# Patient Record
Sex: Female | Born: 1980
Health system: Southern US, Community
[De-identification: ages and names within clinical notes are randomized; demographics above are authoritative.]

## PROBLEM LIST (undated history)

## (undated) DIAGNOSIS — M1990 Unspecified osteoarthritis, unspecified site: Secondary | ICD-10-CM

## (undated) DIAGNOSIS — R51 Headache: Secondary | ICD-10-CM

## (undated) DIAGNOSIS — R112 Nausea with vomiting, unspecified: Secondary | ICD-10-CM

## (undated) DIAGNOSIS — F32A Depression, unspecified: Secondary | ICD-10-CM

## (undated) DIAGNOSIS — M349 Systemic sclerosis, unspecified: Secondary | ICD-10-CM

## (undated) DIAGNOSIS — F419 Anxiety disorder, unspecified: Secondary | ICD-10-CM

## (undated) DIAGNOSIS — Z1509 Genetic susceptibility to other malignant neoplasm: Secondary | ICD-10-CM

## (undated) DIAGNOSIS — Z9889 Other specified postprocedural states: Secondary | ICD-10-CM

## (undated) DIAGNOSIS — C50919 Malignant neoplasm of unspecified site of unspecified female breast: Secondary | ICD-10-CM

## (undated) DIAGNOSIS — K219 Gastro-esophageal reflux disease without esophagitis: Secondary | ICD-10-CM

## (undated) DIAGNOSIS — Z803 Family history of malignant neoplasm of breast: Secondary | ICD-10-CM

## (undated) DIAGNOSIS — T8859XA Other complications of anesthesia, initial encounter: Secondary | ICD-10-CM

## (undated) HISTORY — DX: Depression, unspecified: F32.A

## (undated) HISTORY — DX: Family history of malignant neoplasm of breast: Z80.3

## (undated) HISTORY — PX: UPPER GASTROINTESTINAL ENDOSCOPY: SHX188

## (undated) HISTORY — DX: Systemic sclerosis, unspecified: M34.9

## (undated) HISTORY — DX: Genetic susceptibility to other malignant neoplasm: Z15.09

## (undated) HISTORY — DX: Malignant neoplasm of unspecified site of unspecified female breast: C50.919

## (undated) HISTORY — PX: COLONOSCOPY: SHX174

---
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
## 1999-09-26 ENCOUNTER — Other Ambulatory Visit: Admission: RE | Admit: 1999-09-26 | Discharge: 1999-09-26 | Payer: Self-pay | Admitting: Family Medicine

## 2000-09-08 ENCOUNTER — Other Ambulatory Visit: Admission: RE | Admit: 2000-09-08 | Discharge: 2000-09-08 | Payer: Self-pay | Admitting: Family Medicine

## 2001-10-06 ENCOUNTER — Encounter: Payer: Self-pay | Admitting: Family Medicine

## 2001-10-06 ENCOUNTER — Encounter: Admission: RE | Admit: 2001-10-06 | Discharge: 2001-10-06 | Payer: Self-pay | Admitting: Family Medicine

## 2003-04-18 ENCOUNTER — Other Ambulatory Visit: Admission: RE | Admit: 2003-04-18 | Discharge: 2003-04-18 | Payer: Self-pay | Admitting: *Deleted

## 2003-11-06 ENCOUNTER — Inpatient Hospital Stay (HOSPITAL_COMMUNITY): Admission: AD | Admit: 2003-11-06 | Discharge: 2003-11-10 | Payer: Self-pay | Admitting: *Deleted

## 2003-12-07 ENCOUNTER — Encounter: Admission: RE | Admit: 2003-12-07 | Discharge: 2003-12-07 | Payer: Self-pay | Admitting: *Deleted

## 2004-05-21 ENCOUNTER — Encounter: Admission: RE | Admit: 2004-05-21 | Discharge: 2004-05-21 | Payer: Self-pay | Admitting: Family Medicine

## 2007-08-10 ENCOUNTER — Other Ambulatory Visit: Admission: RE | Admit: 2007-08-10 | Discharge: 2007-08-10 | Payer: Self-pay | Admitting: Family Medicine

## 2008-10-05 ENCOUNTER — Other Ambulatory Visit: Admission: RE | Admit: 2008-10-05 | Discharge: 2008-10-05 | Payer: Self-pay | Admitting: Family Medicine

## 2009-10-05 ENCOUNTER — Other Ambulatory Visit: Admission: RE | Admit: 2009-10-05 | Discharge: 2009-10-05 | Payer: Self-pay | Admitting: Family Medicine

## 2010-07-29 LAB — ABO/RH: RH Type: POSITIVE

## 2010-07-29 LAB — ANTIBODY SCREEN: Antibody Screen: NEGATIVE

## 2010-07-29 LAB — HEPATITIS B SURFACE ANTIGEN: Hepatitis B Surface Ag: NEGATIVE

## 2010-07-29 LAB — RUBELLA ANTIBODY, IGM: Rubella: IMMUNE

## 2010-07-29 LAB — RPR: RPR: NONREACTIVE

## 2010-07-29 LAB — HIV ANTIBODY (ROUTINE TESTING W REFLEX): HIV: NONREACTIVE

## 2010-09-20 NOTE — Discharge Summary (Signed)
NAME:  [name], [name]                           ACCOUNT NO.:  [id number]   MEDICAL RECORD NO.:  [id number]                   PATIENT TYPE:  INP   LOCATION:  9137                                 FACILITY:  [facility]   PHYSICIAN:  Genia Del, M.D.             DATE OF BIRTH:  09-17-1980   DATE OF ADMISSION:  11/06/2003  DATE OF DISCHARGE:  11/10/2003                                 DISCHARGE SUMMARY   ADMISSION DIAGNOSES:  39 plus weeks with spontaneous rupture of membranes  and mild pregnancy induced hypertension.   DISCHARGE DIAGNOSES:  39 plus weeks with spontaneous rupture of membranes  and mild pregnancy induced hypertension.  Arrest of progression and birth of  a baby boy by cesarean section.   PROCEDURE PERFORMED:  Primary urgent low transverse cesarean section.   HOSPITAL COURSE:  The patient was stimulated the Pitocin. She progressed up  to 4 cm dilatation and arrested at that dilatation.  A cesarean section was  done under spinal anesthesia.  It was a primary urgent low transverse  cesarean section. She had a baby boy at 31, Apgar's were 8 & 8. The  hysterotomy was closed in two planes, estimated blood loss 800 mL.  No  complications occurred at the time of surgery. The postop evaluation was  normal.  She remained afebrile and hemodynamically stable. Her postoperative  hemoglobin was 9.3. She was discharged home on postoperative day #3 in  stable status. Postop information and advice were given. The patient will  have Motrin over the counter p.r.n. and Tylox was prescribed p.r.n.  She  will followup at [facility] in 4-6 weeks.                                               Genia Del, M.D.    ML/MEDQ  D:  12/12/2003  T:  12/13/2003  Job:  [id number]

## 2010-09-20 NOTE — Op Note (Signed)
NAME:  [name], [name]                           ACCOUNT NO.:  [id number]   MEDICAL RECORD NO.:  [id number]                   PATIENT TYPE:  INP   LOCATION:  9137                                 FACILITY:  [facility]   PHYSICIAN:  Genia Del, M.D.             DATE OF BIRTH:  11-28-1980   DATE OF PROCEDURE:  11/07/2003  DATE OF DISCHARGE:                                 OPERATIVE REPORT   PREOPERATIVE DIAGNOSES:  39 plus weeks gestation, arrest of progression of  dilatation.   POSTOPERATIVE DIAGNOSES:  39 plus weeks gestation, arrest of progression of  dilatation.   INTERVENTION:  Primary low transverse urgent cesarean section.   SURGEON:  Genia Del, M.D.   ASSISTANT:  Gerri Spore B. Earlene Plater, M.D.   ANESTHESIOLOGIST:  Belva Agee, M.D.   DESCRIPTION OF PROCEDURE:  Under spinal anesthesia, the patient is in 15  degree left decubitus position, she is prepped with Hibiclens in the  abdominal, suprapubic and vulvar areas.  The bladder catheter is already in  place and the patient is draped as usual. After verifying anesthesia, a  Pfannenstiel incision is made with a scalpel, the aponeurosis is opened  transversely with Mayo scissors. The recti muscles are separated from the  aponeurosis on the midline. We then opened the parietoperitoneum  longitudinally. We opened the visceral peritoneum over the lower uterine  segment transversely with Metzenbaum scissors. The bladder is reclined  downward. We opened a hysterotomy transversely on the lower uterine segment  with a scalpel and __________ on each side with the scissors. The amniotic  fluid is clear. The fetus is in cephalic presentation. Birth of a baby boy  at 67, the cord is clamped and cut, the baby is given to the neonatal  team.  Apgar are 8 & 8, weight is 7 and 10 pounds. The placenta is  evacuated. It is complete with three vessels. Cord blood was taken before  doing so. We then do a manual revision of the uterine  cavity. The uterus  contracts well with Pitocin IV. A dose of IV antibiotic is given. We closed  the hysterotomy with a locked running suture of #0 chromic. We make a second  plain in a mattress fashion with #0 chromic. Hemostasis is completed with an  X stitch at the left angle. Both ovaries and both tubes are normal in size  and appearance. We then irrigate and suction the abdominopelvic cavity. We  complete hemostasis on the aponeurosis and recti muscles and bladder flap  with the electrocautery where necessary. We then close the aponeurosis in  two half running sutures of #0 Vicryl. The count of gauzes and instruments  is complete x2. The hemostasis is completed at the adipose tissue with the  electrocautery and the skin is reapproximated with staples. A dry dressing  is applied. The estimated blood loss was 800 mL, no complications occurred  and the patient was  brought to the recovery room in good status.                                               Genia Del, M.D.    ML/MEDQ  D:  11/07/2003  T:  11/07/2003  Job:  [id number]

## 2010-10-17 ENCOUNTER — Inpatient Hospital Stay (HOSPITAL_COMMUNITY)
Admission: AD | Admit: 2010-10-17 | Discharge: 2010-10-17 | Disposition: A | Discharge status: Home / Self Care | Payer: BC Managed Care – PPO | Source: Ambulatory Visit | Attending: Obstetrics and Gynecology | Admitting: Obstetrics and Gynecology

## 2010-10-17 DIAGNOSIS — O99891 Other specified diseases and conditions complicating pregnancy: Secondary | ICD-10-CM | POA: Insufficient documentation

## 2010-10-17 DIAGNOSIS — K5289 Other specified noninfective gastroenteritis and colitis: Secondary | ICD-10-CM | POA: Insufficient documentation

## 2010-10-17 DIAGNOSIS — R109 Unspecified abdominal pain: Secondary | ICD-10-CM | POA: Insufficient documentation

## 2010-10-17 LAB — COMPREHENSIVE METABOLIC PANEL
ALT: 12 U/L (ref 0–35)
AST: 15 U/L (ref 0–37)
Albumin: 3 g/dL — ABNORMAL LOW (ref 3.5–5.2)
Alkaline Phosphatase: 51 U/L (ref 39–117)
BUN: 5 mg/dL — ABNORMAL LOW (ref 6–23)
CO2: 23 mEq/L (ref 19–32)
Calcium: 9.1 mg/dL (ref 8.4–10.5)
Chloride: 100 mEq/L (ref 96–112)
Creatinine, Ser: <0.47 mg/dL (ref 0.4–1.2)
Glucose, Bld: 95 mg/dL (ref 70–99)
Potassium: 4.1 mEq/L (ref 3.5–5.1)
Sodium: 133 mEq/L — ABNORMAL LOW (ref 135–145)
Total Bilirubin: 0.1 mg/dL — ABNORMAL LOW (ref 0.3–1.2)
Total Protein: 6.4 g/dL (ref 6.0–8.3)

## 2010-10-17 LAB — DIFFERENTIAL
Basophils Absolute: 0 10*3/uL (ref 0.0–0.1)
Basophils Relative: 0 % (ref 0–1)
Eosinophils Absolute: 0 10*3/uL (ref 0.0–0.7)
Eosinophils Relative: 0 % (ref 0–5)
Lymphocytes Relative: 12 % (ref 12–46)
Lymphs Abs: 1.1 10*3/uL (ref 0.7–4.0)
Monocytes Absolute: 0.5 10*3/uL (ref 0.1–1.0)
Monocytes Relative: 6 % (ref 3–12)
Neutro Abs: 7.7 10*3/uL (ref 1.7–7.7)
Neutrophils Relative %: 83 % — ABNORMAL HIGH (ref 43–77)

## 2010-10-17 LAB — CBC
HCT: 31.9 % — ABNORMAL LOW (ref 36.0–46.0)
Hemoglobin: 11.1 g/dL — ABNORMAL LOW (ref 12.0–15.0)
MCH: 31.7 pg (ref 26.0–34.0)
MCHC: 34.8 g/dL (ref 30.0–36.0)
MCV: 91.1 fL (ref 78.0–100.0)
Platelets: 193 10*3/uL (ref 150–400)
RBC: 3.5 MIL/uL — ABNORMAL LOW (ref 3.87–5.11)
RDW: 13.3 % (ref 11.5–15.5)
WBC: 9.3 10*3/uL (ref 4.0–10.5)

## 2010-10-17 LAB — URINALYSIS, ROUTINE W REFLEX MICROSCOPIC
Bilirubin Urine: NEGATIVE
Glucose, UA: NEGATIVE mg/dL
Ketones, ur: NEGATIVE mg/dL
Leukocytes, UA: NEGATIVE
Nitrite: NEGATIVE
Protein, ur: NEGATIVE mg/dL
Specific Gravity, Urine: 1.025 (ref 1.005–1.030)
Urobilinogen, UA: 0.2 mg/dL (ref 0.0–1.0)
pH: 5.5 (ref 5.0–8.0)

## 2010-10-17 LAB — URINE MICROSCOPIC-ADD ON

## 2011-02-18 NOTE — Patient Instructions (Addendum)
   Your procedure is scheduled ZO:[id number] October 25th  Enter through the Main Entrance of [facility] at: 11:30 am Pick up the phone at the desk and dial [phone] and inform us of your arrival.  Please call this number if you have any problems the morning of surgery: [phone]  Remember: Do not eat food after midnight: Wednesday Do not drink clear liquids after:9am Thursday Take these medicines the morning of surgery with a SIP OF WATER:none  Do not wear jewelry, make-up, or FINGER nail polish Do not wear lotions, powders, deodorants or perfumes. Do not shave 48 hours prior to surgery. Do not bring valuables to the hospital.  Leave suitcase in the car. After Surgery it may be brought to your room. For patients being admitted to the hospital, checkout time is 11:00am the day of discharge.   Remember to use your hibiclens as instructed.Please shower with 1/2 bottle the evening before your surgery and the other 1/2 bottle the morning of surgery.

## 2011-02-19 ENCOUNTER — Encounter (HOSPITAL_COMMUNITY): Payer: Self-pay

## 2011-02-24 ENCOUNTER — Encounter (HOSPITAL_COMMUNITY): Payer: Self-pay

## 2011-02-24 ENCOUNTER — Encounter (HOSPITAL_COMMUNITY)
Admission: RE | Admit: 2011-02-24 | Discharge: 2011-02-24 | Disposition: A | Discharge status: Home / Self Care | Payer: BC Managed Care – PPO | Source: Ambulatory Visit | Attending: Obstetrics and Gynecology | Admitting: Obstetrics and Gynecology

## 2011-02-24 HISTORY — DX: Gastro-esophageal reflux disease without esophagitis: K21.9

## 2011-02-24 HISTORY — DX: Headache: R51

## 2011-02-24 LAB — CBC
HCT: 33.4 % — ABNORMAL LOW (ref 36.0–46.0)
Hemoglobin: 11.2 g/dL — ABNORMAL LOW (ref 12.0–15.0)
MCH: 30.6 pg (ref 26.0–34.0)
MCHC: 33.5 g/dL (ref 30.0–36.0)
MCV: 91.3 fL (ref 78.0–100.0)
Platelets: 222 10*3/uL (ref 150–400)
RBC: 3.66 MIL/uL — ABNORMAL LOW (ref 3.87–5.11)
RDW: 14 % (ref 11.5–15.5)
WBC: 7.2 10*3/uL (ref 4.0–10.5)

## 2011-02-24 LAB — SURGICAL PCR SCREEN
MRSA, PCR: NEGATIVE
Staphylococcus aureus: POSITIVE — AB

## 2011-02-24 LAB — RPR: RPR Ser Ql: NONREACTIVE

## 2011-02-26 NOTE — H&P (Signed)
  H&P  30 yo G2P1 , [facility] 03/06/11 confirmed by early U/S @ 39 weeks with pregnancy complicated by history of migraine has previous C/S admitted for repeat. No ROM, no CNS change.  PMH, PSH, FHx, OBHx, per prenatal H&P. Meds PNV NKDA  PE Lungs CTA Cor RRR Cx deferred  A: 39 weeks with previous C/S, desires repeat P: repeat C/S, D/W patient risks/benefits.

## 2011-02-27 ENCOUNTER — Encounter (HOSPITAL_COMMUNITY): Payer: Self-pay | Admitting: *Deleted

## 2011-02-27 ENCOUNTER — Encounter (HOSPITAL_COMMUNITY)
Admission: RE | Disposition: A | Discharge status: Home / Self Care | Payer: Self-pay | Source: Ambulatory Visit | Attending: Obstetrics and Gynecology

## 2011-02-27 ENCOUNTER — Inpatient Hospital Stay (HOSPITAL_COMMUNITY)
Admission: RE | Admit: 2011-02-27 | Discharge: 2011-03-01 | DRG: 371 | Disposition: A | Discharge status: Home / Self Care | Payer: BC Managed Care – PPO | Source: Ambulatory Visit | Attending: Obstetrics and Gynecology | Admitting: Obstetrics and Gynecology

## 2011-02-27 ENCOUNTER — Inpatient Hospital Stay (HOSPITAL_COMMUNITY): Payer: BC Managed Care – PPO | Admitting: Anesthesiology

## 2011-02-27 ENCOUNTER — Encounter (HOSPITAL_COMMUNITY): Payer: Self-pay | Admitting: Anesthesiology

## 2011-02-27 DIAGNOSIS — Z01818 Encounter for other preprocedural examination: Secondary | ICD-10-CM

## 2011-02-27 DIAGNOSIS — O34219 Maternal care for unspecified type scar from previous cesarean delivery: Principal | ICD-10-CM | POA: Diagnosis present

## 2011-02-27 DIAGNOSIS — Z01812 Encounter for preprocedural laboratory examination: Secondary | ICD-10-CM

## 2011-02-27 SURGERY — Surgical Case
Anesthesia: Spinal | Site: Abdomen | Wound class: Clean Contaminated

## 2011-02-27 MED ORDER — ATROPINE SULFATE 0.4 MG/ML IJ SOLN
INTRAMUSCULAR | Status: DC | PRN
  Administered 2011-02-27: 0.4 mg via INTRAVENOUS

## 2011-02-27 MED ORDER — DIPHENHYDRAMINE HCL 25 MG PO CAPS: 25.0000 mg | ORAL_CAPSULE | Freq: Four times a day (QID) | ORAL | Status: DC | PRN

## 2011-02-27 MED ORDER — ONDANSETRON HCL 4 MG/2ML IJ SOLN
INTRAMUSCULAR | Status: DC | PRN
  Administered 2011-02-27: 4 mg via INTRAVENOUS

## 2011-02-27 MED ORDER — BUPIVACAINE IN DEXTROSE 0.75-8.25 % IT SOLN
INTRATHECAL | Status: DC | PRN
  Administered 2011-02-27: 1.5 mL via INTRATHECAL

## 2011-02-27 MED ORDER — ATROPINE SULFATE 0.4 MG/ML IJ SOLN
INTRAMUSCULAR | Status: AC
  Filled 2011-02-27: qty 1

## 2011-02-27 MED ORDER — LANOLIN HYDROUS EX OINT: 1.0000 "application " | TOPICAL_OINTMENT | CUTANEOUS | Status: DC | PRN

## 2011-02-27 MED ORDER — SODIUM CHLORIDE 0.9 % IV SOLN: 1.0000 ug/kg/h | INTRAVENOUS | Status: DC | PRN

## 2011-02-27 MED ORDER — OXYCODONE-ACETAMINOPHEN 5-325 MG PO TABS
1.0000 | ORAL_TABLET | ORAL | Status: DC | PRN
  Administered 2011-02-28 – 2011-03-01 (×7): 1 via ORAL
  Filled 2011-02-27 (×6): qty 1
  Filled 2011-02-27: qty 2

## 2011-02-27 MED ORDER — MEPERIDINE HCL 25 MG/ML IJ SOLN: 6.2500 mg | INTRAMUSCULAR | Status: DC | PRN

## 2011-02-27 MED ORDER — MORPHINE SULFATE 0.5 MG/ML IJ SOLN
INTRAMUSCULAR | Status: AC
  Filled 2011-02-27: qty 10

## 2011-02-27 MED ORDER — ONDANSETRON HCL 4 MG/2ML IJ SOLN: 4.0000 mg | INTRAMUSCULAR | Status: DC | PRN

## 2011-02-27 MED ORDER — NALOXONE HCL 0.4 MG/ML IJ SOLN: 0.4000 mg | INTRAMUSCULAR | Status: DC | PRN

## 2011-02-27 MED ORDER — DIPHENHYDRAMINE HCL 50 MG/ML IJ SOLN: 25.0000 mg | INTRAMUSCULAR | Status: DC | PRN

## 2011-02-27 MED ORDER — SIMETHICONE 80 MG PO CHEW
80.0000 mg | CHEWABLE_TABLET | Freq: Three times a day (TID) | ORAL | Status: DC
  Administered 2011-02-27 – 2011-02-28 (×5): 80 mg via ORAL

## 2011-02-27 MED ORDER — IBUPROFEN 600 MG PO TABS
600.0000 mg | ORAL_TABLET | Freq: Four times a day (QID) | ORAL | Status: DC | PRN
  Filled 2011-02-27 (×3): qty 1

## 2011-02-27 MED ORDER — LACTATED RINGERS IV SOLN
INTRAVENOUS | Status: DC
  Administered 2011-02-27 (×4): via INTRAVENOUS

## 2011-02-27 MED ORDER — MORPHINE SULFATE (PF) 0.5 MG/ML IJ SOLN
INTRAMUSCULAR | Status: DC | PRN
  Administered 2011-02-27: .1 mg via INTRATHECAL

## 2011-02-27 MED ORDER — FENTANYL CITRATE 0.05 MG/ML IJ SOLN
INTRAMUSCULAR | Status: DC | PRN
  Administered 2011-02-27: 25 ug via INTRATHECAL

## 2011-02-27 MED ORDER — PANTOPRAZOLE SODIUM 40 MG PO TBEC
40.0000 mg | DELAYED_RELEASE_TABLET | Freq: Once | ORAL | Status: AC
  Administered 2011-02-27: 40 mg via ORAL

## 2011-02-27 MED ORDER — SCOPOLAMINE 1 MG/3DAYS TD PT72: 1.0000 | MEDICATED_PATCH | Freq: Once | TRANSDERMAL | Status: DC

## 2011-02-27 MED ORDER — LACTATED RINGERS IV SOLN: INTRAVENOUS | Status: DC

## 2011-02-27 MED ORDER — SODIUM CHLORIDE 0.9 % IR SOLN
Status: DC | PRN
  Administered 2011-02-27: 1

## 2011-02-27 MED ORDER — ZOLPIDEM TARTRATE 5 MG PO TABS: 5.0000 mg | ORAL_TABLET | Freq: Every evening | ORAL | Status: DC | PRN

## 2011-02-27 MED ORDER — ONDANSETRON HCL 4 MG/2ML IJ SOLN: 4.0000 mg | Freq: Three times a day (TID) | INTRAMUSCULAR | Status: DC | PRN

## 2011-02-27 MED ORDER — PANTOPRAZOLE SODIUM 40 MG PO TBEC
DELAYED_RELEASE_TABLET | ORAL | Status: AC
  Administered 2011-02-27: 40 mg via ORAL
  Filled 2011-02-27: qty 1

## 2011-02-27 MED ORDER — PHENYLEPHRINE HCL 10 MG/ML IJ SOLN
INTRAMUSCULAR | Status: DC | PRN
  Administered 2011-02-27: 80 ug via INTRAVENOUS
  Administered 2011-02-27: 40 ug via INTRAVENOUS
  Administered 2011-02-27: 80 ug via INTRAVENOUS
  Administered 2011-02-27 (×2): 40 ug via INTRAVENOUS
  Administered 2011-02-27: 80 ug via INTRAVENOUS
  Administered 2011-02-27: 40 ug via INTRAVENOUS

## 2011-02-27 MED ORDER — OXYTOCIN 20 UNITS IN LACTATED RINGERS INFUSION - SIMPLE: 125.0000 mL/h | INTRAVENOUS | Status: AC

## 2011-02-27 MED ORDER — SIMETHICONE 80 MG PO CHEW: 80.0000 mg | CHEWABLE_TABLET | ORAL | Status: DC | PRN

## 2011-02-27 MED ORDER — ONDANSETRON HCL 4 MG PO TABS: 4.0000 mg | ORAL_TABLET | ORAL | Status: DC | PRN

## 2011-02-27 MED ORDER — KETOROLAC TROMETHAMINE 30 MG/ML IJ SOLN
30.0000 mg | Freq: Four times a day (QID) | INTRAMUSCULAR | Status: AC | PRN
  Filled 2011-02-27: qty 1

## 2011-02-27 MED ORDER — NALBUPHINE HCL 10 MG/ML IJ SOLN: 5.0000 mg | INTRAMUSCULAR | Status: DC | PRN

## 2011-02-27 MED ORDER — FENTANYL CITRATE 0.05 MG/ML IJ SOLN
INTRAMUSCULAR | Status: AC
  Filled 2011-02-27: qty 2

## 2011-02-27 MED ORDER — KETOROLAC TROMETHAMINE 30 MG/ML IJ SOLN
30.0000 mg | Freq: Once | INTRAMUSCULAR | Status: AC
  Administered 2011-02-27: 30 mg via INTRAVENOUS

## 2011-02-27 MED ORDER — CEFAZOLIN SODIUM 1-5 GM-% IV SOLN: 1.0000 g | INTRAVENOUS | Status: DC

## 2011-02-27 MED ORDER — DIPHENHYDRAMINE HCL 50 MG/ML IJ SOLN: 12.5000 mg | INTRAMUSCULAR | Status: DC | PRN

## 2011-02-27 MED ORDER — DIPHENHYDRAMINE HCL 25 MG PO CAPS: 25.0000 mg | ORAL_CAPSULE | ORAL | Status: DC | PRN

## 2011-02-27 MED ORDER — PHENYLEPHRINE 40 MCG/ML (10ML) SYRINGE FOR IV PUSH (FOR BLOOD PRESSURE SUPPORT)
PREFILLED_SYRINGE | INTRAVENOUS | Status: AC
  Filled 2011-02-27: qty 10

## 2011-02-27 MED ORDER — OXYTOCIN 10 UNIT/ML IJ SOLN
INTRAMUSCULAR | Status: AC
  Filled 2011-02-27: qty 2

## 2011-02-27 MED ORDER — SCOPOLAMINE 1 MG/3DAYS TD PT72
1.0000 | MEDICATED_PATCH | Freq: Once | TRANSDERMAL | Status: DC
  Administered 2011-02-27: 1.5 mg via TRANSDERMAL

## 2011-02-27 MED ORDER — SODIUM CHLORIDE 0.9 % IJ SOLN: 3.0000 mL | INTRAMUSCULAR | Status: DC | PRN

## 2011-02-27 MED ORDER — CEFAZOLIN SODIUM 1-5 GM-% IV SOLN
INTRAVENOUS | Status: DC | PRN
  Administered 2011-02-27: 1 g via INTRAVENOUS

## 2011-02-27 MED ORDER — ONDANSETRON HCL 4 MG/2ML IJ SOLN
INTRAMUSCULAR | Status: AC
  Filled 2011-02-27: qty 2

## 2011-02-27 MED ORDER — WITCH HAZEL-GLYCERIN EX PADS: 1.0000 "application " | MEDICATED_PAD | CUTANEOUS | Status: DC | PRN

## 2011-02-27 MED ORDER — IBUPROFEN 600 MG PO TABS
600.0000 mg | ORAL_TABLET | Freq: Four times a day (QID) | ORAL | Status: DC
  Administered 2011-02-28 – 2011-03-01 (×7): 600 mg via ORAL
  Filled 2011-02-27 (×4): qty 1

## 2011-02-27 MED ORDER — PRENATAL PLUS 27-1 MG PO TABS
1.0000 | ORAL_TABLET | Freq: Every day | ORAL | Status: DC
  Administered 2011-02-28: 1 via ORAL
  Filled 2011-02-27: qty 1

## 2011-02-27 MED ORDER — TETANUS-DIPHTH-ACELL PERTUSSIS 5-2.5-18.5 LF-MCG/0.5 IM SUSP: 0.5000 mL | Freq: Once | INTRAMUSCULAR | Status: DC

## 2011-02-27 MED ORDER — SENNOSIDES-DOCUSATE SODIUM 8.6-50 MG PO TABS
2.0000 | ORAL_TABLET | Freq: Every day | ORAL | Status: DC
  Administered 2011-02-27 – 2011-02-28 (×2): 2 via ORAL

## 2011-02-27 MED ORDER — OXYTOCIN 10 UNIT/ML IJ SOLN
INTRAMUSCULAR | Status: DC | PRN
  Administered 2011-02-27 (×2): 20 [IU] via INTRAMUSCULAR

## 2011-02-27 MED ORDER — MENTHOL 3 MG MT LOZG: 1.0000 | LOZENGE | OROMUCOSAL | Status: DC | PRN

## 2011-02-27 MED ORDER — ACETAMINOPHEN 10 MG/ML IV SOLN: 1000.0000 mg | Freq: Four times a day (QID) | INTRAVENOUS | Status: AC | PRN

## 2011-02-27 MED ORDER — METOCLOPRAMIDE HCL 5 MG/ML IJ SOLN: 10.0000 mg | Freq: Three times a day (TID) | INTRAMUSCULAR | Status: DC | PRN

## 2011-02-27 MED ORDER — DIBUCAINE 1 % RE OINT: 1.0000 "application " | TOPICAL_OINTMENT | RECTAL | Status: DC | PRN

## 2011-02-27 SURGICAL SUPPLY — 31 items
APL SKNCLS STERI-STRIP NONHPOA (GAUZE/BANDAGES/DRESSINGS) ×1
BENZOIN TINCTURE PRP APPL 2/3 (GAUZE/BANDAGES/DRESSINGS) ×1 IMPLANT
CLOTH BEACON ORANGE TIMEOUT ST (SAFETY) ×2 IMPLANT
CONTAINER PREFILL 10% NBF 15ML (MISCELLANEOUS) IMPLANT
DRESSING TELFA 8X3 (GAUZE/BANDAGES/DRESSINGS) IMPLANT
DRSG COVADERM 4X14 (GAUZE/BANDAGES/DRESSINGS) ×1 IMPLANT
ELECT REM PT RETURN 9FT ADLT (ELECTROSURGICAL) ×2
ELECTRODE REM PT RTRN 9FT ADLT (ELECTROSURGICAL) ×1 IMPLANT
EXTRACTOR VACUUM M CUP 4 TUBE (SUCTIONS) IMPLANT
GAUZE SPONGE 4X4 12PLY STRL LF (GAUZE/BANDAGES/DRESSINGS) ×4 IMPLANT
GLOVE BIO SURGEON STRL SZ8 (GLOVE) ×4 IMPLANT
GOWN PREVENTION PLUS LG XLONG (DISPOSABLE) ×4 IMPLANT
GOWN STRL REIN XL XLG (GOWN DISPOSABLE) ×2 IMPLANT
KIT ABG SYR 3ML LUER SLIP (SYRINGE) ×2 IMPLANT
NDL HYPO 25X5/8 SAFETYGLIDE (NEEDLE) ×1 IMPLANT
NEEDLE HYPO 25X5/8 SAFETYGLIDE (NEEDLE) ×2 IMPLANT
NS IRRIG 1000ML POUR BTL (IV SOLUTION) ×2 IMPLANT
PACK C SECTION WH (CUSTOM PROCEDURE TRAY) ×2 IMPLANT
PAD ABD 7.5X8 STRL (GAUZE/BANDAGES/DRESSINGS) IMPLANT
SLEEVE SCD COMPRESS KNEE MED (MISCELLANEOUS) IMPLANT
STAPLER VISISTAT 35W (STAPLE) IMPLANT
STRIP CLOSURE SKIN 1/4X4 (GAUZE/BANDAGES/DRESSINGS) ×2 IMPLANT
SUT MNCRL 0 VIOLET CTX 36 (SUTURE) ×4 IMPLANT
SUT MONOCRYL 0 CTX 36 (SUTURE) ×4
SUT PDS AB 0 CTX 60 (SUTURE) ×2 IMPLANT
SUT PLAIN 0 NONE (SUTURE) IMPLANT
SUT PLAIN 2 0 XLH (SUTURE) ×1 IMPLANT
SUT VIC AB 4-0 KS 27 (SUTURE) ×1 IMPLANT
TOWEL OR 17X24 6PK STRL BLUE (TOWEL DISPOSABLE) ×4 IMPLANT
TRAY FOLEY CATH 14FR (SET/KITS/TRAYS/PACK) ×2 IMPLANT
WATER STERILE IRR 1000ML POUR (IV SOLUTION) ×2 IMPLANT

## 2011-02-27 NOTE — Transfer of Care (Signed)
Immediate Anesthesia Transfer of Care Note  Patient: [name]  Procedure(s) Performed:  CESAREAN SECTION - repeat  Patient Location: PACU  Anesthesia Type: Spinal  Level of Consciousness: awake  Airway & Oxygen Therapy: Patient Spontanous Breathing  Post-op Assessment: Report given to PACU RN  Post vital signs: Reviewed and stable  Complications: No apparent anesthesia complications

## 2011-02-27 NOTE — Brief Op Note (Signed)
02/27/2011  1:58 PM  PATIENT:  [name]  30 y.o. female G62P0 at39 weeks with previous c/s for repeat.  PRE-OPERATIVE DIAGNOSIS:  Previous cesarean section, desires repeat POST-OPERATIVE DIAGNOSIS:  Previous Cesarean Section, desires repeat  PROCEDURE:  Procedure(s): Repeat low tranverse CESAREAN SECTION  SURGEON:  Surgeon(s): Roselle Locus II  PHYSICIAN ASSISTANT:   ASSISTANTS: none   ANESTHESIA:   spinal  EBL:  Total I/O In: 3000 [I.V.:3000] Out: 700 [Urine:200; Blood:500]  BLOOD ADMINISTERED:none  DRAINS: Urinary Catheter (Foley)   LOCAL MEDICATIONS USED:  NONE  SPECIMEN:  No Specimen  DISPOSITION OF SPECIMEN:  N/A  COUNTS:  YES  TOURNIQUET:  * No tourniquets in log *  DICTATION: .Other Dictation: Dictation Number [id number]  PLAN OF CARE: Admit to inpatient   PATIENT DISPOSITION:  PACU - hemodynamically stable.   Delay start of Pharmacological VTE agent (>24hrs) due to surgical blood loss or risk of bleeding:  not applicable

## 2011-02-27 NOTE — Anesthesia Preprocedure Evaluation (Addendum)
Anesthesia Evaluation  Patient identified by MRN, date of birth, ID band Patient awake  General Assessment Comment  Reviewed: Allergy & Precautions, H&P , Patient's Chart, lab work & pertinent test results  Airway Mallampati: II TM Distance: >3 FB Neck ROM: full    Dental No notable dental hx.    Pulmonary  clear to auscultation  Pulmonary exam normal       Cardiovascular regular Normal    Neuro/Psych  Headaches, Negative Neurological ROS  Negative Psych ROS   GI/Hepatic negative GI ROS Neg liver ROS  GERD   Endo/Other  Negative Endocrine ROS  Renal/GU negative Renal ROS     Musculoskeletal   Abdominal   Peds  Hematology negative hematology ROS (+)   Anesthesia Other Findings   Reproductive/Obstetrics (+) Pregnancy                           Anesthesia Physical Anesthesia Plan  ASA: II  Anesthesia Plan: Spinal   Post-op Pain Management:    Induction:   Airway Management Planned:   Additional Equipment:   Intra-op Plan:   Post-operative Plan:   Informed Consent: I have reviewed the patients History and Physical, chart, labs and discussed the procedure including the risks, benefits and alternatives for the proposed anesthesia with the patient or authorized representative who has indicated his/her understanding and acceptance.     Plan Discussed with:   Anesthesia Plan Comments:        Anesthesia Quick Evaluation

## 2011-02-27 NOTE — Anesthesia Procedure Notes (Signed)
Spinal Block  Patient location during procedure: OR Start time: 02/27/2011 1:11 PM Staffing Performed by: anesthesiologist  Preanesthetic Checklist Completed: patient identified, site marked, surgical consent, pre-op evaluation, timeout performed, IV checked, risks and benefits discussed and monitors and equipment checked Spinal Block Patient position: sitting Prep: DuraPrep Patient monitoring: heart rate, cardiac monitor, continuous pulse ox and blood pressure Approach: midline Location: L3-4 Injection technique: single-shot Needle Needle type: Sprotte  Needle gauge: 24 G Needle length: 9 cm Assessment Sensory level: T4 Additional Notes Patient identified.  Risk benefits discussed including failed block, incomplete pain control, headache, nerve damage, paralysis, blood pressure changes, nausea, vomiting, reactions to medication both toxic or allergic, and postpartum back pain.  Patient expressed understanding and wished to proceed.  All questions were answered.  Sterile technique used throughout procedure.  CSF was clear.  No parasthesia or other complications.  Please see nursing notes for vital signs.

## 2011-02-27 NOTE — Op Note (Signed)
[name], [name]                 ACCOUNT NO.:  [id number]  MEDICAL RECORD NO.:  [id number]  LOCATION:  9102                          FACILITY:  [facility]  PHYSICIAN:  Guy Sandifer. Henderson Cloud, M.D. DATE OF BIRTH:  07-28-1980  DATE OF PROCEDURE:  02/27/2011 DATE OF DISCHARGE:                              OPERATIVE REPORT   PREOPERATIVE DIAGNOSES: 1. Intrauterine pregnancy at 60 weeks estimated gestational age. 2. Previous cesarean section, desires repeat.  POSTOPERATIVE DIAGNOSES: 1. Intrauterine pregnancy at 80 weeks estimated gestational age. 2. Previous cesarean section, desires repeat.  PROCEDURE:  Repeat low transverse cesarean section.  SURGEON:  Guy Sandifer. Henderson Cloud, MD.  ANESTHESIA:  Spinal, Brayton Caves, MD.  ESTIMATED BLOOD LOSS:  750 mL.  FINDINGS:  Viable female infant, Apgars of 9 and 9 at 1 and 5 minutes respectively.  Birth weight 9 pounds 1.2 ounces.  Arterial cord pH pending.  INDICATIONS AND CONSENT:  This patient is a 30 year old married white female, G2, P1, at 39 weeks with previous cesarean section.  After discussion of options, she is admitted for repeat.  Potential risks and complications have been discussed preoperatively including, but not limited to infection, organ damage, bleeding requiring transfusion of blood products with HIV and hepatitis acquisition, DVT, PE, pneumonia. All questions have been answered and consent is signed on the chart.  PROCEDURE IN DETAIL:  The patient was taken to the operating room.  She was identified, spinal anesthetic was placed, and she was placed in dorsal supine position with a 15-degree left lateral wedge.  She was prepped abdominally.  Foley catheter was placed in the bladder, and she was draped in sterile fashion.  After testing for adequate spinal anesthesia, skin was entered through the previous Pfannenstiel scar. Dissection was carried out in layers.  The omentum is widely adherent to the lower anterior abdominal wall.   Under good visualization, this was taken down well clear of any bowel.  The omentum was packed away.  The lower uterine segment was exposed.  The vesicouterine peritoneum was taken down cephalad laterally.  Bladder flap was developed.  Bladder blade was placed.  Uterus was incised in a low transverse manner and the uterine cavity was entered bluntly with a hemostat.  Uterine incision was extended cephalad laterally with fingers.  Because of large fetal head as well as the relatively narrow incision secondary to the above scarring, vacuum extractor was used to elevate the vertex through the incision without difficulty.  Cord around the shoulder was reduced.  The baby was then delivered without difficulty.  Good cry and tone was noted.  The cord was clamped and cut.  The baby was handed to awaiting Pediatrics Team.  Placenta was manually delivered.  Uterine cavity was clean.  Uterus was closed in 2 running locking imbricating layers of 0 Monocryl suture, which achieved good hemostasis.  Tubes and ovaries palpated normally.  Anterior peritoneum was closed in running fashion with 0 Monocryl suture, which was also used to reapproximate the pyramidalis muscle in midline.  Anterior rectus fascia was closed in a running fashion with a looped 0 PDS suture.  Subcuticular, subcutaneous layers were closed with interrupted plain suture and the  subcuticular layer was closed with a running subcuticular stitch of 3-0 Vicryl. Dressings were applied.  All counts correct.  The patient was taken to recovery room in stable condition.     Guy Sandifer Henderson Cloud, M.D.     JET/MEDQ  D:  02/27/2011  T:  02/27/2011  Job:  [id number]

## 2011-02-27 NOTE — Progress Notes (Signed)
Reviewed procedure with patient. No changes in history or exam. All questions answered. Repeat cesarean section.

## 2011-02-27 NOTE — Anesthesia Postprocedure Evaluation (Signed)
Anesthesia Post Note  Patient: [name]  Procedure(s) Performed:  CESAREAN SECTION - repeat  Anesthesia type: Spinal  Patient location: PACU  Post pain: Pain level controlled  Post assessment: Post-op Vital signs reviewed  Last Vitals:  Filed Vitals:   02/27/11 1515  BP: 93/64  Pulse: 70  Temp:   Resp:     Post vital signs: Reviewed  Level of consciousness: awake  Complications: No apparent anesthesia complications

## 2011-02-28 LAB — CBC
HCT: 29.6 % — ABNORMAL LOW (ref 36.0–46.0)
Hemoglobin: 10 g/dL — ABNORMAL LOW (ref 12.0–15.0)
MCH: 30.6 pg (ref 26.0–34.0)
MCHC: 33.8 g/dL (ref 30.0–36.0)
MCV: 90.5 fL (ref 78.0–100.0)
Platelets: 177 10*3/uL (ref 150–400)
RBC: 3.27 MIL/uL — ABNORMAL LOW (ref 3.87–5.11)
RDW: 13.8 % (ref 11.5–15.5)
WBC: 9.9 10*3/uL (ref 4.0–10.5)

## 2011-02-28 NOTE — Anesthesia Postprocedure Evaluation (Signed)
  Anesthesia Post-op Note  Patient: [name]  Procedure(s) Performed:  CESAREAN SECTION - repeat  Patient Location: PACU and Mother/Baby  Anesthesia Type: Spinal  Level of Consciousness: awake and alert   Airway and Oxygen Therapy: Patient Spontanous Breathing  Post-op Assessment: Patient's Cardiovascular Status Stable and Respiratory Function Stable  Post-op Vital Signs: stable  Complications: No apparent anesthesia complications

## 2011-02-28 NOTE — Addendum Note (Signed)
Addendum  created 02/28/11 4782 by Edison Pace, CRNA   Modules edited:Notes Section

## 2011-02-28 NOTE — Progress Notes (Signed)
Subjective: Postpartum Day 1: Cesarean Delivery Patient reports tolerating PO and no problems voiding.    Objective: Vital signs in last 24 hours: Temp:  [97.3 F (36.3 C)-98.6 F (37 C)] 98.5 F (36.9 C) (10/26 0553) Pulse Rate:  [65-99] 76  (10/26 0553) Resp:  [18-20] 20  (10/26 0553) BP: (93-129)/(43-85) 104/57 mmHg (10/26 0553) SpO2:  [95 %-100 %] 96 % (10/26 0553) Weight:  [90.719 kg (200 lb)] 200 lb (90.719 kg) (10/25 1700)  Physical Exam:  General: alert and cooperative Lochia: appropriate Uterine Fundus: firm Abdominal dressing noted with drainage.Dressing was partially removed without evidence of current bleeding DVT Evaluation: No evidence of DVT seen on physical exam.   Basename 02/28/11 0510  HGB 10.0*  HCT 29.6*    Assessment/Plan: Status post Cesarean section. Doing well postoperatively.  Continue current care.  Beldon Nowling G 02/28/2011, 7:59 AM

## 2011-03-01 MED ORDER — OXYCODONE-ACETAMINOPHEN 5-325 MG PO TABS: 1.0000 | ORAL_TABLET | ORAL | Status: AC | PRN

## 2011-03-01 MED ORDER — IBUPROFEN 600 MG PO TABS: 600.0000 mg | ORAL_TABLET | Freq: Four times a day (QID) | ORAL | Status: AC | PRN

## 2011-03-01 NOTE — Discharge Summary (Signed)
Obstetric Discharge Summary Reason for Admission: cesarean section Prenatal Procedures: none Intrapartum Procedures: cesarean: low cervical, transverse Postpartum Procedures: none Complications-Operative and Postpartum: none Hemoglobin  Date Value Range Status  02/28/2011 10.0* 12.0-15.0 (g/dL) Final     HCT  Date Value Range Status  02/28/2011 29.6* 36.0-46.0 (%) Final    Discharge Diagnoses: Term Pregnancy-delivered  Discharge Information: Date: 03/01/2011 Activity: pelvic rest x 6weeks Diet: routine Medications: PNV, Ibuprofen and Percocet Condition: stable Instructions: refer to practice specific booklet Discharge to: home Follow-up Information    Make an appointment in 2 weeks to follow up.         Newborn Data: Live born female  Birth Weight: 9 lb 1.2 oz (4115 g) APGAR: 9, 9  Home with mother.  [name] 03/01/2011, 8:36 AM

## 2011-03-01 NOTE — Progress Notes (Signed)
Subjective: Postpartum Day 2: Cesarean Delivery Patient reports tolerating PO, + flatus and no problems voiding.    Objective: Vital signs in last 24 hours: Temp:  [98 F (36.7 C)-98.8 F (37.1 C)] 98.1 F (36.7 C) (10/27 0534) Pulse Rate:  [80-87] 87  (10/27 0534) Resp:  [18] 18  (10/27 0534) BP: (104-111)/(66-72) 104/66 mmHg (10/27 0534) SpO2:  [97 %] 97 % (10/26 1300)  Physical Exam:  General: alert and cooperative Lochia: appropriate Uterine Fundus: firm Incision: healing well DVT Evaluation: No evidence of DVT seen on physical exam.   Basename 02/28/11 0510  HGB 10.0*  HCT 29.6*    Assessment/Plan: Status post Cesarean section. Doing well postoperatively.  Discharge home with standard precautions and return to clinic in 1-2 weeks.  Aithana Kushner 03/01/2011, 8:33 AM

## 2011-03-03 ENCOUNTER — Encounter (HOSPITAL_COMMUNITY): Payer: Self-pay | Admitting: Obstetrics and Gynecology

## 2011-03-26 ENCOUNTER — Ambulatory Visit (HOSPITAL_COMMUNITY): Payer: BC Managed Care – PPO | Attending: Obstetrics and Gynecology

## 2012-07-07 ENCOUNTER — Other Ambulatory Visit (HOSPITAL_COMMUNITY)
Admission: RE | Admit: 2012-07-07 | Discharge: 2012-07-07 | Disposition: A | Discharge status: Home / Self Care | Payer: BC Managed Care – PPO | Source: Ambulatory Visit | Attending: Family Medicine | Admitting: Family Medicine

## 2012-07-07 ENCOUNTER — Other Ambulatory Visit: Payer: Self-pay | Admitting: Physician Assistant

## 2012-07-07 DIAGNOSIS — Z124 Encounter for screening for malignant neoplasm of cervix: Secondary | ICD-10-CM | POA: Insufficient documentation

## 2013-07-11 ENCOUNTER — Other Ambulatory Visit (HOSPITAL_COMMUNITY)
Admission: RE | Admit: 2013-07-11 | Discharge: 2013-07-11 | Disposition: A | Discharge status: Home / Self Care | Payer: BC Managed Care – PPO | Source: Ambulatory Visit | Attending: Family Medicine | Admitting: Family Medicine

## 2013-07-11 ENCOUNTER — Other Ambulatory Visit: Payer: Self-pay | Admitting: Physician Assistant

## 2013-07-11 DIAGNOSIS — Z124 Encounter for screening for malignant neoplasm of cervix: Secondary | ICD-10-CM | POA: Insufficient documentation

## 2014-03-06 ENCOUNTER — Encounter (HOSPITAL_COMMUNITY): Payer: Self-pay | Admitting: Obstetrics and Gynecology

## 2014-07-20 ENCOUNTER — Other Ambulatory Visit: Payer: Self-pay | Admitting: Physician Assistant

## 2014-07-20 ENCOUNTER — Other Ambulatory Visit (HOSPITAL_COMMUNITY)
Admission: RE | Admit: 2014-07-20 | Discharge: 2014-07-20 | Disposition: A | Discharge status: Home / Self Care | Payer: BLUE CROSS/BLUE SHIELD | Source: Ambulatory Visit | Attending: Family Medicine | Admitting: Family Medicine

## 2014-07-20 DIAGNOSIS — Z124 Encounter for screening for malignant neoplasm of cervix: Secondary | ICD-10-CM | POA: Insufficient documentation

## 2014-07-24 LAB — CYTOLOGY - PAP

## 2016-08-19 DIAGNOSIS — Z0000 Encounter for general adult medical examination without abnormal findings: Secondary | ICD-10-CM | POA: Diagnosis not present

## 2017-04-21 DIAGNOSIS — M5412 Radiculopathy, cervical region: Secondary | ICD-10-CM | POA: Diagnosis not present

## 2017-04-23 DIAGNOSIS — M50222 Other cervical disc displacement at C5-C6 level: Secondary | ICD-10-CM | POA: Diagnosis not present

## 2017-04-23 DIAGNOSIS — M5412 Radiculopathy, cervical region: Secondary | ICD-10-CM | POA: Diagnosis not present

## 2017-05-04 DIAGNOSIS — Z6831 Body mass index (BMI) 31.0-31.9, adult: Secondary | ICD-10-CM | POA: Diagnosis not present

## 2017-05-04 DIAGNOSIS — M5020 Other cervical disc displacement, unspecified cervical region: Secondary | ICD-10-CM | POA: Diagnosis not present

## 2017-05-04 DIAGNOSIS — I10 Essential (primary) hypertension: Secondary | ICD-10-CM | POA: Diagnosis not present

## 2017-05-05 HISTORY — PX: CERVICAL FUSION: SHX112

## 2017-06-04 DIAGNOSIS — M5020 Other cervical disc displacement, unspecified cervical region: Secondary | ICD-10-CM | POA: Diagnosis not present

## 2017-06-04 DIAGNOSIS — Z01812 Encounter for preprocedural laboratory examination: Secondary | ICD-10-CM | POA: Diagnosis not present

## 2017-06-10 DIAGNOSIS — M50122 Cervical disc disorder at C5-C6 level with radiculopathy: Secondary | ICD-10-CM | POA: Diagnosis not present

## 2017-06-10 DIAGNOSIS — M5020 Other cervical disc displacement, unspecified cervical region: Secondary | ICD-10-CM | POA: Diagnosis not present

## 2017-06-23 DIAGNOSIS — M5020 Other cervical disc displacement, unspecified cervical region: Secondary | ICD-10-CM | POA: Diagnosis not present

## 2017-10-05 DIAGNOSIS — M5020 Other cervical disc displacement, unspecified cervical region: Secondary | ICD-10-CM | POA: Diagnosis not present

## 2017-11-02 ENCOUNTER — Other Ambulatory Visit: Payer: Self-pay | Admitting: Physician Assistant

## 2017-11-02 ENCOUNTER — Other Ambulatory Visit (HOSPITAL_COMMUNITY)
Admission: RE | Admit: 2017-11-02 | Discharge: 2017-11-02 | Disposition: A | Discharge status: Home / Self Care | Payer: BLUE CROSS/BLUE SHIELD | Source: Ambulatory Visit | Attending: Family Medicine | Admitting: Family Medicine

## 2017-11-02 DIAGNOSIS — Z01419 Encounter for gynecological examination (general) (routine) without abnormal findings: Secondary | ICD-10-CM | POA: Insufficient documentation

## 2017-11-02 DIAGNOSIS — Z124 Encounter for screening for malignant neoplasm of cervix: Secondary | ICD-10-CM | POA: Diagnosis not present

## 2017-11-02 DIAGNOSIS — Z136 Encounter for screening for cardiovascular disorders: Secondary | ICD-10-CM | POA: Diagnosis not present

## 2017-11-02 DIAGNOSIS — Z0000 Encounter for general adult medical examination without abnormal findings: Secondary | ICD-10-CM | POA: Diagnosis not present

## 2017-11-03 LAB — CYTOLOGY - PAP
Adequacy: ABSENT
Diagnosis: NEGATIVE

## 2018-09-17 ENCOUNTER — Other Ambulatory Visit: Payer: Self-pay | Admitting: Physician Assistant

## 2018-09-17 DIAGNOSIS — N63 Unspecified lump in unspecified breast: Secondary | ICD-10-CM | POA: Diagnosis not present

## 2018-09-17 DIAGNOSIS — N643 Galactorrhea not associated with childbirth: Secondary | ICD-10-CM | POA: Diagnosis not present

## 2018-09-17 DIAGNOSIS — N6310 Unspecified lump in the right breast, unspecified quadrant: Secondary | ICD-10-CM

## 2018-10-06 ENCOUNTER — Other Ambulatory Visit: Payer: Self-pay | Admitting: Physician Assistant

## 2018-10-06 ENCOUNTER — Ambulatory Visit
Admission: RE | Admit: 2018-10-06 | Discharge: 2018-10-06 | Disposition: A | Discharge status: Home / Self Care | Payer: BLUE CROSS/BLUE SHIELD | Source: Ambulatory Visit | Attending: Physician Assistant | Admitting: Physician Assistant

## 2018-10-06 ENCOUNTER — Other Ambulatory Visit: Payer: Self-pay

## 2018-10-06 ENCOUNTER — Ambulatory Visit
Admission: RE | Admit: 2018-10-06 | Discharge: 2018-10-06 | Disposition: A | Discharge status: Home / Self Care | Payer: BC Managed Care – PPO | Source: Ambulatory Visit | Attending: Physician Assistant | Admitting: Physician Assistant

## 2018-10-06 DIAGNOSIS — N6311 Unspecified lump in the right breast, upper outer quadrant: Secondary | ICD-10-CM | POA: Diagnosis not present

## 2018-10-06 DIAGNOSIS — N6012 Diffuse cystic mastopathy of left breast: Secondary | ICD-10-CM | POA: Diagnosis not present

## 2018-10-06 DIAGNOSIS — N6310 Unspecified lump in the right breast, unspecified quadrant: Secondary | ICD-10-CM

## 2018-10-06 DIAGNOSIS — N6320 Unspecified lump in the left breast, unspecified quadrant: Secondary | ICD-10-CM

## 2018-10-06 DIAGNOSIS — R922 Inconclusive mammogram: Secondary | ICD-10-CM | POA: Diagnosis not present

## 2018-10-11 ENCOUNTER — Other Ambulatory Visit: Payer: Self-pay

## 2018-10-11 ENCOUNTER — Ambulatory Visit
Admission: RE | Admit: 2018-10-11 | Discharge: 2018-10-11 | Disposition: A | Discharge status: Home / Self Care | Payer: BC Managed Care – PPO | Source: Ambulatory Visit | Attending: Physician Assistant | Admitting: Physician Assistant

## 2018-10-11 DIAGNOSIS — N6311 Unspecified lump in the right breast, upper outer quadrant: Secondary | ICD-10-CM | POA: Diagnosis not present

## 2018-10-11 DIAGNOSIS — C50411 Malignant neoplasm of upper-outer quadrant of right female breast: Secondary | ICD-10-CM | POA: Diagnosis not present

## 2018-10-11 DIAGNOSIS — N6310 Unspecified lump in the right breast, unspecified quadrant: Secondary | ICD-10-CM

## 2018-10-15 ENCOUNTER — Other Ambulatory Visit: Payer: Self-pay | Admitting: General Surgery

## 2018-10-15 ENCOUNTER — Telehealth: Payer: Self-pay | Admitting: *Deleted

## 2018-10-15 ENCOUNTER — Telehealth: Payer: Self-pay | Admitting: Radiation Oncology

## 2018-10-15 ENCOUNTER — Other Ambulatory Visit: Payer: Self-pay

## 2018-10-15 ENCOUNTER — Encounter (HOSPITAL_BASED_OUTPATIENT_CLINIC_OR_DEPARTMENT_OTHER): Payer: Self-pay | Admitting: *Deleted

## 2018-10-15 DIAGNOSIS — C50411 Malignant neoplasm of upper-outer quadrant of right female breast: Secondary | ICD-10-CM | POA: Diagnosis not present

## 2018-10-15 DIAGNOSIS — Z171 Estrogen receptor negative status [ER-]: Secondary | ICD-10-CM

## 2018-10-15 NOTE — Telephone Encounter (Signed)
Called pt to discuss med onc appt with Dr. Lindi Adie. Confirmed appt for 6/16 at 3:30. Gave navigation resources and contact information.

## 2018-10-15 NOTE — Telephone Encounter (Signed)
New message:     LVM for patient to return call to schedule appt from referral received from Dr. [name]

## 2018-10-16 ENCOUNTER — Other Ambulatory Visit (HOSPITAL_COMMUNITY)
Admission: RE | Admit: 2018-10-16 | Discharge: 2018-10-16 | Disposition: A | Discharge status: Home / Self Care | Payer: BC Managed Care – PPO | Source: Ambulatory Visit | Attending: General Surgery | Admitting: General Surgery

## 2018-10-16 DIAGNOSIS — Z1159 Encounter for screening for other viral diseases: Secondary | ICD-10-CM | POA: Insufficient documentation

## 2018-10-18 LAB — NOVEL CORONAVIRUS, NAA (HOSP ORDER, SEND-OUT TO REF LAB; TAT 18-24 HRS): SARS-CoV-2, NAA: NOT DETECTED

## 2018-10-18 NOTE — Progress Notes (Signed)
[facility] CONSULT NOTE  Patient Care Team: Redmon, Barth Kirks, PA-C as PCP - General (Nurse Practitioner)  CHIEF COMPLAINTS/PURPOSE OF CONSULTATION: Newly diagnosed breast cancer  HISTORY OF PRESENTING ILLNESS:  [name] 38 y.o. female is here because of recent diagnosis of invasive ductal carcinoma of the right breast. The cancer was detected on a diagnostic mammogram and [place] after the patient palpated a lump. It showed a 1.6cm mass in the right breast, no abnormal right axillary lymph nodes, and benign cysts in the left breast. Biopsy on 10/11/18 showed grade 3 invasive ductal carcinoma, HER2 positive (3+), ER/PR negative, Ki67 20%. She presents to the clinic today for initial evaluation and discussion of treatment options.   I reviewed her records extensively and collaborated the history with the patient.  SUMMARY OF ONCOLOGIC HISTORY: Oncology History  Malignant neoplasm of upper-outer quadrant of right breast in female, estrogen receptor negative ([place])  10/11/2018 Initial Diagnosis   Patient palpated a right breast mass, mammogram revealed 1.6 cm mass, biopsy revealed grade 3 invasive ductal carcinoma ER 0%, PR 0%, Ki-67 20%, HER-2 3+ positive, T1CN0 stage IA   10/19/2018 Cancer Staging   Staging form: Breast, AJCC 8th Edition - Clinical stage from 10/19/2018: Stage IA (cT1c, cN0, cM0, G3, ER-, PR-, HER2+) - Signed by [name], MD on 10/19/2018      MEDICAL HISTORY:  Past Medical History:  Diagnosis Date   Anxiety    over cancer diagnosis   Cancer ([place]) 10/2018   breast cancer    SURGICAL HISTORY: Past Surgical History:  Procedure Laterality Date   CERVICAL FUSION  2019   CESAREAN SECTION  2005   CESAREAN SECTION  02/27/2011   Procedure: CESAREAN SECTION;  Surgeon: Shon Millet II;  Location: [facility];  Service: Gynecology;  Laterality: N/A;  repeat    SOCIAL HISTORY: Social History   Socioeconomic History   Marital status: Married   Spouse name: Not on file   Number of children: Not on file   Years of education: Not on file   Highest education level: Not on file  Occupational History   Not on file  Social Needs   Financial resource strain: Not on file   Food insecurity    Worry: Not on file    Inability: Not on file   Transportation needs    Medical: Not on file    Non-medical: Not on file  Tobacco Use   Smoking status: Never Smoker   Smokeless tobacco: Never Used  Substance and Sexual Activity   Alcohol use: No   Drug use: No   Sexual activity: Yes    Birth control/protection: Surgical    Comment: husband has had vasectomy  Lifestyle   Physical activity    Days per week: Not on file    Minutes per session: Not on file   Stress: Not on file  Relationships   Social connections    Talks on phone: Not on file    Gets together: Not on file    Attends religious service: Not on file    Active member of club or organization: Not on file    Attends meetings of clubs or organizations: Not on file    Relationship status: Not on file   Intimate partner violence    Fear of current or ex partner: Not on file    Emotionally abused: Not on file    Physically abused: Not on file    Forced sexual activity: Not on  file  Other Topics Concern   Not on file  Social History Narrative   Not on file    FAMILY HISTORY: No family history on file.  ALLERGIES:  has No Known Allergies.  MEDICATIONS:  Current Outpatient Medications  Medication Sig Dispense Refill   ALPRAZolam (XANAX) 0.5 MG tablet Take 0.5 mg by mouth at bedtime as needed for anxiety.     No current facility-administered medications for this visit.    Facility-Administered Medications Ordered in Other Visits  Medication Dose Route Frequency Provider Last Rate Last Dose   acetaminophen (OFIRMEV) IV 1,000 mg  1,000 mg Intravenous Once PRN [name], MD       acetaminophen (TYLENOL) tablet 325-650 mg  325-650 mg Oral Once  PRN [name], MD       Or   acetaminophen (TYLENOL) solution 325-650 mg  325-650 mg Oral Once PRN [name], MD       [START ON 10/21/2018] feeding supplement (ENSURE PRE-SURGERY) liquid 296 mL  296 mL Oral Once Rolm Bookbinder, MD       fentaNYL (SUBLIMAZE) injection 25-50 mcg  25-50 mcg Intravenous Q5 min PRN [name], MD   25 mcg at 10/20/18 1232   fentaNYL (SUBLIMAZE) injection 50 mcg  50 mcg Intravenous Q5 min PRN Annye Asa, MD   50 mcg at 10/20/18 1112   HYDROmorphone (DILAUDID) injection 0.25-0.5 mg  0.25-0.5 mg Intravenous Q5 min PRN Lillia Abed, MD       lactated ringers infusion   Intravenous Continuous Annye Asa, MD   Stopped at 10/20/18 1318   lactated ringers infusion   Intravenous Continuous [name], MD       meperidine (DEMEROL) injection 6.25-12.5 mg  6.25-12.5 mg Intravenous Q5 min PRN [name], MD       meperidine (DEMEROL) injection 6.25-12.5 mg  6.25-12.5 mg Intravenous Q5 min PRN Lillia Abed, MD       midazolam (VERSED) injection 1-2 mg  1-2 mg Intravenous Q5 min PRN Annye Asa, MD       ondansetron [facility]) injection 4 mg  4 mg Intravenous Once PRN Lillia Abed, MD       oxyCODONE (Oxy IR/ROXICODONE) immediate release tablet 5 mg  5 mg Oral Once PRN [name], MD       Or   oxyCODONE (ROXICODONE) 5 MG/5ML solution 5 mg  5 mg Oral Once PRN [name], MD       promethazine (PHENERGAN) injection 6.25-12.5 mg  6.25-12.5 mg Intravenous Q15 min PRN [name], MD       scopolamine (TRANSDERM-SCOP) 1 MG/3DAYS 1.5 mg  1 patch Transdermal Once Annye Asa, MD        REVIEW OF SYSTEMS:   Constitutional: Denies fevers, chills or abnormal night sweats Eyes: Denies blurriness of vision, double vision or watery eyes Ears, nose, mouth, throat, and face: Denies mucositis or sore throat Respiratory: Denies cough, dyspnea or wheezes Cardiovascular: Denies palpitation, chest discomfort or  lower extremity swelling Gastrointestinal:  Denies nausea, heartburn or change in bowel habits Skin: Denies abnormal skin rashes Lymphatics: Denies new lymphadenopathy or easy bruising Neurological:Denies numbness, tingling or new weaknesses Behavioral/Psych: Mood is stable, no new changes  Breast: (+) palpable lump in right breast All other systems were reviewed with the patient and are negative.  PHYSICAL EXAMINATION: ECOG PERFORMANCE STATUS: 1 - Symptomatic but completely ambulatory  Vitals:   10/19/18 1545  BP: 137/83  Pulse: 77  Resp:  18  Temp: 98 F (36.7 C)  SpO2: 100%   Filed Weights   10/19/18 1545  Weight: 165 lb (74.8 kg)    Physical exam not done due to COVID-19 precautions  LABORATORY DATA:  I have reviewed the data as listed Lab Results  Component Value Date   WBC 9.9 02/28/2011   HGB 10.0 (L) 02/28/2011   HCT 29.6 (L) 02/28/2011   MCV 90.5 02/28/2011   PLT 177 02/28/2011   Lab Results  Component Value Date   NA 133 (L) 10/17/2010   K 4.1 10/17/2010   CL 100 10/17/2010   CO2 23 10/17/2010    RADIOGRAPHIC STUDIES: I have personally reviewed the radiological reports and agreed with the findings in the report.  ASSESSMENT AND PLAN:  Malignant neoplasm of upper-outer quadrant of right breast in female, estrogen receptor negative ([place]) 10/11/2018:Patient palpated a right breast mass, mammogram revealed 1.6 cm mass, biopsy revealed grade 3 invasive ductal carcinoma ER 0%, PR 0%, Ki-67 20%, HER-2 3+ positive, T1CN0 stage IA  Pathology and radiology counseling: Discussed with the patient, the details of pathology including the type of breast cancer,the clinical staging, the significance of ER, PR and HER-2/neu receptors and the implications for treatment. After reviewing the pathology in detail, we proceeded to discuss the different treatment options between surgery, radiation, chemotherapy, antiestrogen therapies.  Recommendation: 1.  We discussed the pros  and cons of neoadjuvant chemo versus adjuvant chemotherapy.  Since surgery prefers the patient to have tumor shrinkage been able better lumpectomy, we will proceed with neoadjuvant Taxol and Herceptin weekly x12.  Followed by Herceptin maintenance therapy every 3 weeks to complete 1 year. 2.  Breast conserving surgery with sentinel lymph node biopsy 3.  Adjuvant radiation therapy  Chemotherapy Counseling: I discussed the risks and benefits of chemotherapy including the risks of nausea/ vomiting, risk of infection from low WBC count, fatigue due to chemo or anemia, bruising or bleeding due to low platelets, mouth sores, loss/ change in taste and decreased appetite. Liver and kidney function will be monitored through out chemotherapy as abnormalities in liver and kidney function may be a side effect of treatment. Cardiac dysfunction due to  Herceptin was discussed in detail. Risk of permanent bone marrow dysfunction and leukemia due to chemo were also discussed.  I offered the patient Cold cap /Digna cap to prevent hair loss.  Plan: 1.  Echocardiogram 2.  Chemo class 3.  Port placement 4. UPBEAT clinical trial ([place]): Newly diagnosed stage I to III breast cancer patients receiving either adjuvant or neoadjuvant chemotherapy undergo cardiac MRI before treatment and at 24 months along with neurocognitive testing, exercise and disability measures at baseline 3, 12 and 24 months. 5.  Start chemotherapy in 1 to 2 weeks      All questions were answered. The patient knows to call the clinic with any problems, questions or concerns.   Rulon Eisenmenger, MD 10/20/2018    I, Molly Dorshimer, am acting as scribe for [name], MD.  I have reviewed the above documentation for accuracy and completeness, and I agree with the above.

## 2018-10-19 ENCOUNTER — Other Ambulatory Visit: Payer: Self-pay | Admitting: General Surgery

## 2018-10-19 ENCOUNTER — Other Ambulatory Visit: Payer: Self-pay

## 2018-10-19 ENCOUNTER — Inpatient Hospital Stay: Payer: BC Managed Care – PPO | Attending: Hematology and Oncology | Admitting: Hematology and Oncology

## 2018-10-19 VITALS — BP 137/83 | HR 77 | Temp 98.0°F | Resp 18 | Ht 64.0 in | Wt 165.0 lb

## 2018-10-19 DIAGNOSIS — F419 Anxiety disorder, unspecified: Secondary | ICD-10-CM | POA: Insufficient documentation

## 2018-10-19 DIAGNOSIS — Z79899 Other long term (current) drug therapy: Secondary | ICD-10-CM | POA: Insufficient documentation

## 2018-10-19 DIAGNOSIS — C50411 Malignant neoplasm of upper-outer quadrant of right female breast: Secondary | ICD-10-CM

## 2018-10-19 DIAGNOSIS — Z171 Estrogen receptor negative status [ER-]: Secondary | ICD-10-CM | POA: Diagnosis not present

## 2018-10-19 DIAGNOSIS — Z5111 Encounter for antineoplastic chemotherapy: Secondary | ICD-10-CM | POA: Diagnosis not present

## 2018-10-19 DIAGNOSIS — Z5112 Encounter for antineoplastic immunotherapy: Secondary | ICD-10-CM | POA: Diagnosis not present

## 2018-10-19 NOTE — Progress Notes (Addendum)
[id number] UPBEAT  Pt was referred by Dr. Lindi Adie for UPBEAT study. This RN briefly explained the UPBEAT study. Provided patient with informed consent, hipaa form, UPBEAT brochure, and this RN's business card. Patient confirmed that she can hold her breath for 10 seconds, is not claustrophobic and can walk 2 blocks without chest pain, dyspnea, shortness of breath or fainting. Pt said it was ok for this RN to call her on 06/18 to see if she had any questions regarding the study. Thanked patient for her interest. Johney Maine RN, BSN Clinical Research  10/19/18 5:06 PM

## 2018-10-19 NOTE — Progress Notes (Signed)
Echocardiogram orders placed.  

## 2018-10-19 NOTE — Progress Notes (Signed)
Ensure drink given with instructions to spouse. NPO otherwise DOS. Verbalized understanding. CHG bath also given with instructions.

## 2018-10-19 NOTE — Assessment & Plan Note (Addendum)
10/11/2018:Patient palpated a right breast mass, mammogram revealed 1.6 cm mass, biopsy revealed grade 3 invasive ductal carcinoma ER 0%, PR 0%, Ki-67 20%, HER-2 3+ positive, T1CN0 stage IA  Pathology and radiology counseling: Discussed with the patient, the details of pathology including the type of breast cancer,the clinical staging, the significance of ER, PR and HER-2/neu receptors and the implications for treatment. After reviewing the pathology in detail, we proceeded to discuss the different treatment options between surgery, radiation, chemotherapy, antiestrogen therapies.  Recommendation: 1.  We discussed the pros and cons of neoadjuvant chemo versus adjuvant chemotherapy.  Since surgery prefers the patient to have tumor shrinkage been able better lumpectomy, we will proceed with neoadjuvant Taxol and Herceptin weekly x12.  Followed by Herceptin maintenance therapy every 3 weeks to complete 1 year. 2.  Breast conserving surgery with sentinel lymph node biopsy 3.  Adjuvant radiation therapy  Chemotherapy Counseling: I discussed the risks and benefits of chemotherapy including the risks of nausea/ vomiting, risk of infection from low WBC count, fatigue due to chemo or anemia, bruising or bleeding due to low platelets, mouth sores, loss/ change in taste and decreased appetite. Liver and kidney function will be monitored through out chemotherapy as abnormalities in liver and kidney function may be a side effect of treatment. Cardiac dysfunction due to  Herceptin was discussed in detail. Risk of permanent bone marrow dysfunction and leukemia due to chemo were also discussed.  I offered the patient Cold cap /Digna cap to prevent hair loss.  Plan: 1.  Echocardiogram 2.  Chemo class 3.  Port placement 4. UPBEAT clinical trial ([place]): Newly diagnosed stage I to III breast cancer patients receiving either adjuvant or neoadjuvant chemotherapy undergo cardiac MRI before treatment and at 24 months  along with neurocognitive testing, exercise and disability measures at baseline 3, 12 and 24 months. 5.  Start chemotherapy in 1 to 2 weeks

## 2018-10-20 ENCOUNTER — Ambulatory Visit (HOSPITAL_COMMUNITY): Payer: BC Managed Care – PPO

## 2018-10-20 ENCOUNTER — Telehealth: Payer: Self-pay

## 2018-10-20 ENCOUNTER — Ambulatory Visit (HOSPITAL_BASED_OUTPATIENT_CLINIC_OR_DEPARTMENT_OTHER): Payer: BC Managed Care – PPO | Admitting: Anesthesiology

## 2018-10-20 ENCOUNTER — Encounter (HOSPITAL_BASED_OUTPATIENT_CLINIC_OR_DEPARTMENT_OTHER): Payer: Self-pay | Admitting: Anesthesiology

## 2018-10-20 ENCOUNTER — Ambulatory Visit (HOSPITAL_BASED_OUTPATIENT_CLINIC_OR_DEPARTMENT_OTHER)
Admission: RE | Admit: 2018-10-20 | Discharge: 2018-10-20 | Disposition: A | Discharge status: Home / Self Care | Payer: BC Managed Care – PPO | Attending: General Surgery | Admitting: General Surgery

## 2018-10-20 ENCOUNTER — Other Ambulatory Visit: Payer: Self-pay

## 2018-10-20 ENCOUNTER — Ambulatory Visit
Admission: RE | Admit: 2018-10-20 | Discharge: 2018-10-20 | Disposition: A | Discharge status: Home / Self Care | Payer: BC Managed Care – PPO | Source: Ambulatory Visit | Attending: General Surgery | Admitting: General Surgery

## 2018-10-20 ENCOUNTER — Encounter (HOSPITAL_BASED_OUTPATIENT_CLINIC_OR_DEPARTMENT_OTHER)
Admission: RE | Disposition: A | Discharge status: Home / Self Care | Payer: Self-pay | Source: Home / Self Care | Attending: General Surgery

## 2018-10-20 DIAGNOSIS — Z833 Family history of diabetes mellitus: Secondary | ICD-10-CM | POA: Insufficient documentation

## 2018-10-20 DIAGNOSIS — C50411 Malignant neoplasm of upper-outer quadrant of right female breast: Secondary | ICD-10-CM | POA: Insufficient documentation

## 2018-10-20 DIAGNOSIS — Z171 Estrogen receptor negative status [ER-]: Secondary | ICD-10-CM | POA: Diagnosis not present

## 2018-10-20 DIAGNOSIS — Z452 Encounter for adjustment and management of vascular access device: Secondary | ICD-10-CM | POA: Diagnosis not present

## 2018-10-20 DIAGNOSIS — Z95828 Presence of other vascular implants and grafts: Secondary | ICD-10-CM

## 2018-10-20 DIAGNOSIS — Z8249 Family history of ischemic heart disease and other diseases of the circulatory system: Secondary | ICD-10-CM | POA: Diagnosis not present

## 2018-10-20 DIAGNOSIS — Z809 Family history of malignant neoplasm, unspecified: Secondary | ICD-10-CM | POA: Diagnosis not present

## 2018-10-20 DIAGNOSIS — D0511 Intraductal carcinoma in situ of right breast: Secondary | ICD-10-CM | POA: Diagnosis not present

## 2018-10-20 HISTORY — DX: Anxiety disorder, unspecified: F41.9

## 2018-10-20 HISTORY — PX: PORTACATH PLACEMENT: SHX2246

## 2018-10-20 SURGERY — INSERTION, TUNNELED CENTRAL VENOUS DEVICE, WITH PORT
Anesthesia: General | Site: Chest | Laterality: Right

## 2018-10-20 MED ORDER — FENTANYL CITRATE (PF) 100 MCG/2ML IJ SOLN
25.0000 ug | INTRAMUSCULAR | Status: DC | PRN
  Administered 2018-10-20 (×2): 25 ug via INTRAVENOUS

## 2018-10-20 MED ORDER — SCOPOLAMINE 1 MG/3DAYS TD PT72: 1.0000 | MEDICATED_PATCH | Freq: Once | TRANSDERMAL | Status: DC

## 2018-10-20 MED ORDER — LORAZEPAM 0.5 MG PO TABS: 0.5000 mg | ORAL_TABLET | Freq: Every evening | ORAL | 0 refills | Status: DC | PRN

## 2018-10-20 MED ORDER — GABAPENTIN 100 MG PO CAPS
ORAL_CAPSULE | ORAL | Status: AC
  Filled 2018-10-20: qty 1

## 2018-10-20 MED ORDER — ACETAMINOPHEN 325 MG PO TABS: 325.0000 mg | ORAL_TABLET | Freq: Once | ORAL | Status: DC | PRN

## 2018-10-20 MED ORDER — PROPOFOL 10 MG/ML IV BOLUS
INTRAVENOUS | Status: AC
  Filled 2018-10-20: qty 20

## 2018-10-20 MED ORDER — ONDANSETRON HCL 8 MG PO TABS: 8.0000 mg | ORAL_TABLET | Freq: Two times a day (BID) | ORAL | 1 refills | Status: DC | PRN

## 2018-10-20 MED ORDER — ACETAMINOPHEN 500 MG PO TABS
ORAL_TABLET | ORAL | Status: AC
  Filled 2018-10-20: qty 2

## 2018-10-20 MED ORDER — LACTATED RINGERS IV SOLN
INTRAVENOUS | Status: DC
  Administered 2018-10-20: 11:00:00 via INTRAVENOUS

## 2018-10-20 MED ORDER — DEXAMETHASONE SODIUM PHOSPHATE 4 MG/ML IJ SOLN
INTRAMUSCULAR | Status: DC | PRN
  Administered 2018-10-20: 10 mg via INTRAVENOUS

## 2018-10-20 MED ORDER — HEPARIN (PORCINE) IN NACL 2-0.9 UNITS/ML
INTRAMUSCULAR | Status: AC | PRN
  Administered 2018-10-20: 1

## 2018-10-20 MED ORDER — PROCHLORPERAZINE MALEATE 10 MG PO TABS: 10.0000 mg | ORAL_TABLET | Freq: Four times a day (QID) | ORAL | 1 refills | Status: DC | PRN

## 2018-10-20 MED ORDER — PROPOFOL 10 MG/ML IV BOLUS
INTRAVENOUS | Status: DC | PRN
  Administered 2018-10-20: 200 mg via INTRAVENOUS

## 2018-10-20 MED ORDER — MEPERIDINE HCL 25 MG/ML IJ SOLN: 6.2500 mg | INTRAMUSCULAR | Status: DC | PRN

## 2018-10-20 MED ORDER — CEFAZOLIN SODIUM-DEXTROSE 2-4 GM/100ML-% IV SOLN
INTRAVENOUS | Status: AC
  Filled 2018-10-20: qty 100

## 2018-10-20 MED ORDER — ONDANSETRON HCL 4 MG/2ML IJ SOLN
INTRAMUSCULAR | Status: DC | PRN
  Administered 2018-10-20: 4 mg via INTRAVENOUS

## 2018-10-20 MED ORDER — GABAPENTIN 100 MG PO CAPS
100.0000 mg | ORAL_CAPSULE | ORAL | Status: AC
  Administered 2018-10-20: 100 mg via ORAL

## 2018-10-20 MED ORDER — CEFAZOLIN SODIUM-DEXTROSE 2-4 GM/100ML-% IV SOLN
2.0000 g | INTRAVENOUS | Status: AC
  Administered 2018-10-20: 2 g via INTRAVENOUS

## 2018-10-20 MED ORDER — KETOROLAC TROMETHAMINE 15 MG/ML IJ SOLN
INTRAMUSCULAR | Status: AC
  Filled 2018-10-20: qty 1

## 2018-10-20 MED ORDER — LIDOCAINE 2% (20 MG/ML) 5 ML SYRINGE
INTRAMUSCULAR | Status: DC | PRN
  Administered 2018-10-20: 80 mg via INTRAVENOUS

## 2018-10-20 MED ORDER — LACTATED RINGERS IV SOLN: INTRAVENOUS | Status: DC

## 2018-10-20 MED ORDER — MIDAZOLAM HCL 2 MG/2ML IJ SOLN: 1.0000 mg | INTRAMUSCULAR | Status: DC | PRN

## 2018-10-20 MED ORDER — FENTANYL CITRATE (PF) 100 MCG/2ML IJ SOLN
INTRAMUSCULAR | Status: AC
  Filled 2018-10-20: qty 2

## 2018-10-20 MED ORDER — ENSURE PRE-SURGERY PO LIQD: 296.0000 mL | Freq: Once | ORAL | Status: DC

## 2018-10-20 MED ORDER — MIDAZOLAM HCL 2 MG/2ML IJ SOLN
INTRAMUSCULAR | Status: AC
  Filled 2018-10-20: qty 2

## 2018-10-20 MED ORDER — MIDAZOLAM HCL 5 MG/5ML IJ SOLN
INTRAMUSCULAR | Status: DC | PRN
  Administered 2018-10-20: 2 mg via INTRAVENOUS

## 2018-10-20 MED ORDER — LIDOCAINE-PRILOCAINE 2.5-2.5 % EX CREA: TOPICAL_CREAM | CUTANEOUS | 3 refills | Status: DC

## 2018-10-20 MED ORDER — HEPARIN SOD (PORK) LOCK FLUSH 100 UNIT/ML IV SOLN
INTRAVENOUS | Status: DC | PRN
  Administered 2018-10-20: 300 [IU] via INTRAVENOUS

## 2018-10-20 MED ORDER — GADOBUTROL 1 MMOL/ML IV SOLN
8.0000 mL | Freq: Once | INTRAVENOUS | Status: AC | PRN
  Administered 2018-10-20: 8 mL via INTRAVENOUS

## 2018-10-20 MED ORDER — FENTANYL CITRATE (PF) 100 MCG/2ML IJ SOLN
50.0000 ug | INTRAMUSCULAR | Status: DC | PRN
  Administered 2018-10-20 (×2): 50 ug via INTRAVENOUS

## 2018-10-20 MED ORDER — BUPIVACAINE HCL (PF) 0.25 % IJ SOLN
INTRAMUSCULAR | Status: DC | PRN
  Administered 2018-10-20: 7 mL

## 2018-10-20 MED ORDER — OXYCODONE HCL 5 MG/5ML PO SOLN: 5.0000 mg | Freq: Once | ORAL | Status: DC | PRN

## 2018-10-20 MED ORDER — ACETAMINOPHEN 160 MG/5ML PO SOLN: 325.0000 mg | Freq: Once | ORAL | Status: DC | PRN

## 2018-10-20 MED ORDER — KETOROLAC TROMETHAMINE 15 MG/ML IJ SOLN
15.0000 mg | INTRAMUSCULAR | Status: AC
  Administered 2018-10-20: 15 mg via INTRAVENOUS

## 2018-10-20 MED ORDER — HYDROMORPHONE HCL 1 MG/ML IJ SOLN: 0.2500 mg | INTRAMUSCULAR | Status: DC | PRN

## 2018-10-20 MED ORDER — OXYCODONE HCL 5 MG PO TABS: 5.0000 mg | ORAL_TABLET | Freq: Once | ORAL | Status: DC | PRN

## 2018-10-20 MED ORDER — ACETAMINOPHEN 10 MG/ML IV SOLN: 1000.0000 mg | Freq: Once | INTRAVENOUS | Status: DC | PRN

## 2018-10-20 MED ORDER — PROMETHAZINE HCL 25 MG/ML IJ SOLN: 6.2500 mg | INTRAMUSCULAR | Status: DC | PRN

## 2018-10-20 MED ORDER — ONDANSETRON HCL 4 MG/2ML IJ SOLN: 4.0000 mg | Freq: Once | INTRAMUSCULAR | Status: DC | PRN

## 2018-10-20 MED ORDER — ACETAMINOPHEN 500 MG PO TABS
1000.0000 mg | ORAL_TABLET | ORAL | Status: AC
  Administered 2018-10-20: 1000 mg via ORAL

## 2018-10-20 SURGICAL SUPPLY — 55 items
ADH SKN CLS APL DERMABOND .7 (GAUZE/BANDAGES/DRESSINGS) ×1
APL PRP STRL LF DISP 70% ISPRP (MISCELLANEOUS) ×1
APL SKNCLS STERI-STRIP NONHPOA (GAUZE/BANDAGES/DRESSINGS) ×1
BAG DECANTER FOR FLEXI CONT (MISCELLANEOUS) ×2 IMPLANT
BENZOIN TINCTURE PRP APPL 2/3 (GAUZE/BANDAGES/DRESSINGS) ×2 IMPLANT
BLADE SURG 11 STRL SS (BLADE) ×2 IMPLANT
BLADE SURG 15 STRL LF DISP TIS (BLADE) ×1 IMPLANT
BLADE SURG 15 STRL SS (BLADE) ×2
CANISTER SUCT 1200ML W/VALVE (MISCELLANEOUS) IMPLANT
CHLORAPREP W/TINT 26 (MISCELLANEOUS) ×2 IMPLANT
COVER BACK TABLE REUSABLE LG (DRAPES) ×2 IMPLANT
COVER MAYO STAND REUSABLE (DRAPES) ×2 IMPLANT
COVER PROBE 5X48 (MISCELLANEOUS) ×2
COVER WAND RF STERILE (DRAPES) IMPLANT
DECANTER SPIKE VIAL GLASS SM (MISCELLANEOUS) IMPLANT
DERMABOND ADVANCED (GAUZE/BANDAGES/DRESSINGS) ×1
DERMABOND ADVANCED .7 DNX12 (GAUZE/BANDAGES/DRESSINGS) ×1 IMPLANT
DRAPE C-ARM 42X72 X-RAY (DRAPES) ×2 IMPLANT
DRAPE LAPAROSCOPIC ABDOMINAL (DRAPES) ×2 IMPLANT
DRAPE UTILITY XL STRL (DRAPES) ×2 IMPLANT
DRSG TEGADERM 4X4.75 (GAUZE/BANDAGES/DRESSINGS) IMPLANT
ELECT COATED BLADE 2.86 ST (ELECTRODE) ×2 IMPLANT
ELECT REM PT RETURN 9FT ADLT (ELECTROSURGICAL) ×2
ELECTRODE REM PT RTRN 9FT ADLT (ELECTROSURGICAL) ×1 IMPLANT
GAUZE SPONGE 4X4 12PLY STRL LF (GAUZE/BANDAGES/DRESSINGS) ×2 IMPLANT
GLOVE BIO SURGEON STRL SZ 6.5 (GLOVE) ×1 IMPLANT
GLOVE BIO SURGEON STRL SZ7 (GLOVE) ×2 IMPLANT
GLOVE BIOGEL PI IND STRL 7.0 (GLOVE) IMPLANT
GLOVE BIOGEL PI IND STRL 7.5 (GLOVE) ×1 IMPLANT
GLOVE BIOGEL PI INDICATOR 7.0 (GLOVE) ×2
GLOVE BIOGEL PI INDICATOR 7.5 (GLOVE) ×1
GOWN STRL REUS W/ TWL LRG LVL3 (GOWN DISPOSABLE) ×2 IMPLANT
GOWN STRL REUS W/TWL LRG LVL3 (GOWN DISPOSABLE) ×4
IV KIT MINILOC 20X1 SAFETY (NEEDLE) IMPLANT
KIT CVR 48X5XPRB PLUP LF (MISCELLANEOUS) IMPLANT
KIT PORT POWER 8FR ISP CVUE (Port) ×1 IMPLANT
NDL HYPO 25X1 1.5 SAFETY (NEEDLE) ×1 IMPLANT
NDL SAFETY ECLIPSE 18X1.5 (NEEDLE) IMPLANT
NEEDLE HYPO 18GX1.5 SHARP (NEEDLE)
NEEDLE HYPO 25X1 1.5 SAFETY (NEEDLE) ×2 IMPLANT
PACK BASIN DAY SURGERY FS (CUSTOM PROCEDURE TRAY) ×2 IMPLANT
PENCIL BUTTON HOLSTER BLD 10FT (ELECTRODE) ×2 IMPLANT
SLEEVE SCD COMPRESS KNEE MED (MISCELLANEOUS) ×2 IMPLANT
STRIP CLOSURE SKIN 1/2X4 (GAUZE/BANDAGES/DRESSINGS) ×2 IMPLANT
SUT MNCRL AB 4-0 PS2 18 (SUTURE) ×2 IMPLANT
SUT PROLENE 2 0 SH DA (SUTURE) ×2 IMPLANT
SUT SILK 2 0 TIES 17X18 (SUTURE)
SUT SILK 2-0 18XBRD TIE BLK (SUTURE) IMPLANT
SUT VIC AB 3-0 SH 27 (SUTURE) ×2
SUT VIC AB 3-0 SH 27X BRD (SUTURE) ×1 IMPLANT
SYR 5ML LUER SLIP (SYRINGE) ×2 IMPLANT
SYR CONTROL 10ML LL (SYRINGE) ×2 IMPLANT
TOWEL GREEN STERILE FF (TOWEL DISPOSABLE) ×2 IMPLANT
TUBE CONNECTING 20X1/4 (TUBING) IMPLANT
YANKAUER SUCT BULB TIP NO VENT (SUCTIONS) IMPLANT

## 2018-10-20 NOTE — Progress Notes (Signed)
Location of Breast Cancer: Malignant neoplasm of upper outer quadrant of right breast in female, ER -   Staging form: Breast, AJCC 8th Edition - Clinical stage from 10/19/2018: Stage IA (cT1c, cN0, cM0, G3, ER-, PR-, HER2+) - Signed by [name], MD on 10/19/2018  Did patient present with symptoms (if so, please note symptoms) or was this found on screening mammography?: Patient palpated a lump in her right breast, later detected via diagnostic mammogram. 2.5 months ago.  Diagnostic mammogram: 1.6 cm mass in the right breast, no abnormal right axillary lymph nodes, and benign cysts in the left breast.  Histology per Pathology Report: Right Breast 10/11/2018   Receptor Status: ER(0% -), PR (0% -), Her2-neu (+), Ki-67(20%)     Past/Anticipated interventions by surgeon, if any: Dr. [name] 10/15/2018 - We discussed staging and pathophysiology of breast cancer and all available treatment options. -I did discuss she will need chemotherapy/anti Her 2 therapy.  Question is adjuvant vs neoadjuvant therapy.  We will have her see [facility] to discuss.  MRI pending to assess given dense breasts, young age and Her 2 positivity. -She is considering mastectomy with reconstruction so neoadjuvant might allow more time for this plus be able to assess response. -I discussed port placement with her and we will schedule. -I did discuss lumpectomy vs mastectomy, we discussed nsm with reconstruction and contralateral side only recommended if genetics positive.  We also discussed eventual sn biopsy and risks of that and rationale for that procedure.  Past/Anticipated interventions by medical oncology, if any: Chemotherapy  Dr. Lindi Adie 10/19/2018 -After reviewing the pathology in detail, we proceeded to discuss the different treatment options between surgery, radiation, chemotherapy, antiestrogen therapies. -Recommendation: 1.  We discussed the pros and cons of neoadjuvant chemo versus adjuvant chemotherapy.  Since  surgery prefers the patient to have tumor shrinkage been able better lumpectomy, we will proceed with neoadjuvant Taxol and Herceptin weekly x12.  Followed by Herceptin maintenance therapy every 3 weeks to complete 1 year. 2.  Breast conserving surgery with sentinel lymph node biopsy 3.  Adjuvant radiation therapy  Chemo start 6/25 x 3 months, herceptin x 1 year   Lymphedema issues, if any: No    Pain issues, if any:  Occasional twinges of pain in the breast.  Has some pain and swelling from port insertion.  SAFETY ISSUES:  Prior radiation? No  Pacemaker/ICD? No  Possible current pregnancy? No, husband had vasectomy   Is the patient on methotrexate? No  Current Complaints / other details:   Port insertion 10/20/2018    Cori Razor, RN 10/20/2018,2:58 PM

## 2018-10-20 NOTE — Anesthesia Preprocedure Evaluation (Addendum)
Anesthesia Evaluation  Patient identified by MRN, date of birth, ID band Patient awake    Reviewed: Allergy & Precautions, NPO status , Patient's Chart, lab work & pertinent test results  Airway Mallampati: I  TM Distance: >3 FB Neck ROM: Full    Dental  (+) Teeth Intact, Dental Advisory Given   Pulmonary neg pulmonary ROS,    breath sounds clear to auscultation       Cardiovascular negative cardio ROS   Rhythm:Regular Rate:Normal     Neuro/Psych negative neurological ROS     GI/Hepatic negative GI ROS, Neg liver ROS,   Endo/Other  negative endocrine ROS  Renal/GU negative Renal ROS     Musculoskeletal   Abdominal Normal abdominal exam  (+)   Peds  Hematology negative hematology ROS (+)   Anesthesia Other Findings   Reproductive/Obstetrics                            Anesthesia Physical Anesthesia Plan  ASA: II  Anesthesia Plan: General   Post-op Pain Management:    Induction: Intravenous  PONV Risk Score and Plan: 4 or greater and Ondansetron, Dexamethasone, Midazolam and Scopolamine patch - Pre-op  Airway Management Planned: LMA  Additional Equipment: None  Intra-op Plan:   Post-operative Plan: Extubation in OR  Informed Consent: I have reviewed the patients History and Physical, chart, labs and discussed the procedure including the risks, benefits and alternatives for the proposed anesthesia with the patient or authorized representative who has indicated his/her understanding and acceptance.     Dental advisory given  Plan Discussed with: CRNA  Anesthesia Plan Comments:        Anesthesia Quick Evaluation

## 2018-10-20 NOTE — Op Note (Signed)
Preoperative diagnosisher 2 positive breast cancer Postoperative diagnosis: same as above Procedure: right ij US guided powerport insertion Surgeon: Dr Serita Grammes EBL: minimal Anes: general  Specimensnone Complications none Drains none Sponge count correct Dispo to pacu stable  Indications: This is a56 yof with a her 2 positive clinical stage I breast cancer. She is due to begin chemotherapy first to downstage prior to surgery and give prognostic informationWe discussed port placement for primary systemic therapy.  Procedure: After informed consent was obtained the patient was taken to the operating room. She was given antibiotics. Sequential compression devices were on her legs. She was then placed under general anesthesia. Then she was prepped and draped in the standard sterile surgical fashion. Surgical timeout was then performed.  Ithenused the ultrasound to identify the right internal jugular vein. I went below here prior neck incision from fusion. I then accessed the vein using the ultrasound.This aspirated blood. I then placed the wire. This was confirmed by fluoroscopy and ultrasound to be in the correct position.I then infiltrated marcaine and made anincision. I created a pocket.I tunneled the line between the 2 sites.I then dilated the tract and placed the dilator assembly with the sheath. This was done under fluoroscopy. I then removed the sheath and dilator. The wire was also removed. The line was then pulled back to be in the venacava. There was no ectopy.  I hooked this up to the port. I sutured this into place with 2-0 Prolene in 2 places. This aspirated blood and flushed easily.This was confirmed with a final fluoroscopy. I then closed this with 2-0 Vicryl and 4-0 Monocryl.Dermabond was placed on both the incisions.dressings were placedShe tolerated this well and was transferred to the recovery room in stable condition

## 2018-10-20 NOTE — Interval H&P Note (Signed)
History and Physical Interval Note:  10/20/2018 10:49 AM  [name]  has presented today for surgery, with the diagnosis of breast cancer.  The various methods of treatment have been discussed with the patient and family. After consideration of risks, benefits and other options for treatment, the patient has consented to  Procedure(s) with comments: INSERTION PORT-A-CATH WITH ULTRASOUND (N/A) - GENERAL AND LMA as a surgical intervention.  The patient's history has been reviewed, patient examined, no change in status, stable for surgery.  I have reviewed the patient's chart and labs.  Questions were answered to the patient's satisfaction.     Rolm Bookbinder

## 2018-10-20 NOTE — Anesthesia Procedure Notes (Signed)
Procedure Name: LMA Insertion Date/Time: 10/20/2018 11:18 AM Performed by: Lieutenant Diego, CRNA Pre-anesthesia Checklist: Patient identified, Emergency Drugs available, Suction available and Patient being monitored Patient Re-evaluated:Patient Re-evaluated prior to induction Oxygen Delivery Method: Circle system utilized Preoxygenation: Pre-oxygenation with 100% oxygen Induction Type: IV induction Ventilation: Mask ventilation without difficulty LMA: LMA inserted LMA Size: 4.0 Number of attempts: 1 Placement Confirmation: positive ETCO2 and breath sounds checked- equal and bilateral Tube secured with: Tape Dental Injury: Teeth and Oropharynx as per pre-operative assessment

## 2018-10-20 NOTE — H&P (Signed)
59 yof referred by Barth Kirks Redmon for new right breast cancer. she noted this mass a couple months ago but evaluation was delayed due to covid. she has fh only in elderly great aunt. no prior breast history. she does have longstanding expressed bilateral discharge that occasionally occurs. she underwent mm with c density breasts. there was focal asymmetry in the outer left breast and in the right breast 10 clock 7 cm from nipple there is a 1.2 cm mass. the axilla is negative. this underwent core biopsy and is a grade III IDC that is er/pr neg, her 2 pos and Ki is 20%. 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 Caffeine use  Carbonated beverages. No drug use  Tobacco use  Never smoker.  Family History  Diabetes Mellitus  Father, Mother. Hypertension  Father. Melanoma  Father.  Pregnancy / Birth History  Age at menarche  71 years. Contraceptive History  Intrauterine device, Oral contraceptives. Gravida  2 Para  2 Regular periods    Review of Systems  General Not Present- Appetite Loss, Chills, Fatigue, Fever, Night Sweats, Weight Gain and Weight Loss. Skin Not Present- Change in Wart/Mole, Dryness, Hives, Jaundice, New Lesions, Non-Healing Wounds, Rash and Ulcer. Respiratory Not Present- Bloody sputum, Chronic Cough, Difficulty Breathing, Snoring and Wheezing. Breast Present- Breast Pain and Nipple Discharge. Not Present- Breast Mass and Skin Changes. Cardiovascular Not Present- Chest Pain, Difficulty Breathing Lying Down, Leg Cramps, Palpitations, Rapid Heart Rate, Shortness of Breath and Swelling of  Extremities. Female Genitourinary Not Present- Frequency, Nocturia, Painful Urination, Pelvic Pain and Urgency. Psychiatric Present- Anxiety. Not Present- Bipolar, Change in Sleep Pattern, Depression, Fearful and Frequent crying. Hematology Not Present- Blood Thinners, Easy Bruising, Excessive bleeding, Gland problems, HIV and Persistent Infections.   Physical Exam  General Mental Status-Alert. Head and Neck Trachea-midline. Thyroid Gland Characteristics - normal size and consistency. Eye Sclera/Conjunctiva - Bilateral-No scleral icterus. Chest and Lung Exam Chest and lung exam reveals -quiet, even and easy respiratory effort with no use of accessory muscles. Breast Nipples-No Discharge. Note: 1.5 cm mass uoq mobile with surrounding hematoma from biopsy Cardiovascular Cardiovascular examination reveals -normal heart sounds, regular rate and rhythm with no murmurs. Neurologic Neurologic evaluation reveals -alert and oriented x 3 with no impairment of recent or remote memory. Lymphatic Head & Neck General Head & Neck Lymphatics: Bilateral - Description - Normal. Axillary General Axillary Region: Bilateral - Description - Normal. Note: no [place] adenopathy  Assessment & Plan Rolm Bookbinder MD; 10/15/2018 10:26 AM)  BREAST CANCER OF UPPER-OUTER QUADRANT OF RIGHT FEMALE BREAST (C50.411) Story: MRI, genetics, rad onc and med onc appt, port placement  We discussed staging and pathophysiology of breast cancer and all available treatment options. we reviewed her pathology report. I did discuss she will need chemotherapy/anti her 2 therapy. question is adjuvant vs neoadjuvant therapy and will have her see med onc to discuss. mri pending to assess given dense breasts, young age and her 2 positivity. she is considering mastectomy with recon so neoadjuvant might allow more time for this plus be able to assess response. I discussed port placement with her and will schedule. I  did discuss lumpectomy vs mastectomy. we discussed nsm with reconstruction and contralateral side only recommended if genetics positive. we also discussed eventual sn biopsy and risks of that and rationale for that procedure. I will await med onc appt as well as genetics and mri and follow up with her. I spent an hour discussing this with she and her husband

## 2018-10-20 NOTE — Progress Notes (Signed)
START OFF PATHWAY REGIMEN - Breast   [id number]:Paclitaxel + Trastuzumab:   A cycle is every 28 days:     Paclitaxel      Trastuzumab-xxxx      Trastuzumab-xxxx   **Always confirm dose/schedule in your pharmacy ordering system**  Patient Characteristics: Preoperative or Nonsurgical Candidate (Clinical Staging), Neoadjuvant Therapy followed by Surgery, Invasive Disease, Chemotherapy, HER2 Positive, ER Negative/Unknown Therapeutic Status: Preoperative or Nonsurgical Candidate (Clinical Staging) AJCC M Category: cM0 AJCC Grade: G3 Breast Surgical Plan: Neoadjuvant Therapy followed by Surgery ER Status: Negative (-) AJCC 8 Stage Grouping: IA HER2 Status: Positive (+) AJCC T Category: cT1c AJCC N Category: cN0 PR Status: Negative (-) Intent of Therapy: Curative Intent, Discussed with Patient 

## 2018-10-20 NOTE — Transfer of Care (Signed)
Immediate Anesthesia Transfer of Care Note  Patient: [name]  Procedure(s) Performed: INSERTION PORT-A-CATH WITH ULTRASOUND (Right Chest)  Patient Location: PACU  Anesthesia Type:General  Level of Consciousness: awake and alert   Airway & Oxygen Therapy: Patient Spontanous Breathing and Patient connected to face mask oxygen  Post-op Assessment: Report given to RN and Post -op Vital signs reviewed and stable  Post vital signs: Reviewed and stable  Last Vitals:  Vitals Value Taken Time  BP    Temp    Pulse 106 10/20/18 1156  Resp 11 10/20/18 1156  SpO2 100 % 10/20/18 1156  Vitals shown include unvalidated device data.  Last Pain:  Vitals:   10/20/18 1003  TempSrc: Oral  PainSc: 0-No pain         Complications: No apparent anesthesia complications

## 2018-10-20 NOTE — Discharge Instructions (Signed)
PORT-A-CATH: POST OP INSTRUCTIONS  Always review your discharge instruction sheet given to you by the facility where your surgery was performed.   1. A prescription for pain medication may be given to you upon discharge. Take your pain medication as prescribed, if needed. If narcotic pain medicine is not needed, then you make take acetaminophen (Tylenol) or ibuprofen (Advil) as needed. No tylenol or ibuprofen until 5:00pm if needed 2. Take your usually prescribed medications unless otherwise directed. 3. If you need a refill on your pain medication, please contact our office. All narcotic pain medicine now requires a paper prescription.  Phoned in and fax refills are no longer allowed by law.  Prescriptions will not be filled after 5 pm or on weekends.  4. You should follow a light diet for the remainder of the day after your procedure. 5. Most patients will experience some mild swelling and/or bruising in the area of the incision. It may take several days to resolve. 6. It is common to experience some constipation if taking pain medication after surgery. Increasing fluid intake and taking a stool softener (such as Colace) will usually help or prevent this problem from occurring. A mild laxative (Milk of Magnesia or Miralax) should be taken according to package directions if there are no bowel movements after 48 hours.  7. Unless discharge instructions indicate otherwise, you may remove your bandages 48 hours after surgery, and you may shower at that time. You may have steri-strips (small white skin tapes) in place directly over the incision.  These strips should be left on the skin for 7-10 days.  If your surgeon used Dermabond (skin glue) on the incision, you may shower in 24 hours.  The glue will flake off over the next 2-3 weeks.  8. If your port is left accessed at the end of surgery (needle left in port), the dressing cannot get wet and should only by changed by a healthcare professional. When  the port is no longer accessed (when the needle has been removed), follow step 7.   9. ACTIVITIES:  Limit activity involving your arms for the next 72 hours. Do no strenuous exercise or activity for 1 week. You may drive when you are no longer taking prescription pain medication, you can comfortably wear a seatbelt, and you can maneuver your car. 10.You may need to see your doctor in the office for a follow-up appointment.  Please       check with your doctor.  11.When you receive a new Port-a-Cath, you will get a product guide and        ID card.  Please keep them in case you need them.  WHEN TO CALL YOUR DOCTOR [phone]): 1. Fever over 101.0 2. Chills 3. Continued bleeding from incision 4. Increased redness and tenderness at the site 5. Shortness of breath, difficulty breathing   The clinic staff is available to answer your questions during regular business hours. Please dont hesitate to call and ask to speak to one of the nurses or medical assistants for clinical concerns. If you have a medical emergency, go to the nearest emergency room or call 911.  A surgeon from [facility] is always on call at the hospital.     For further information, please visit [email or web address]   Post Anesthesia Home Care Instructions  Activity: Get plenty of rest for the remainder of the day. A responsible individual must stay with you for 24 hours following the procedure.  For the next  24 hours, DO NOT: -Drive a car -Paediatric nurse -Drink alcoholic beverages -Take any medication unless instructed by your physician -Make any legal decisions or sign important papers.  Meals: Start with liquid foods such as gelatin or soup. Progress to regular foods as tolerated. Avoid greasy, spicy, heavy foods. If nausea and/or vomiting occur, drink only clear liquids until the nausea and/or vomiting subsides. Call your physician if vomiting continues.  Special  Instructions/Symptoms: Your throat may feel dry or sore from the anesthesia or the breathing tube placed in your throat during surgery. If this causes discomfort, gargle with warm salt water. The discomfort should disappear within 24 hours.  If you had a scopolamine patch placed behind your ear for the management of post- operative nausea and/or vomiting:  1. The medication in the patch is effective for 72 hours, after which it should be removed.  Wrap patch in a tissue and discard in the trash. Wash hands thoroughly with soap and water. 2. You may remove the patch earlier than 72 hours if you experience unpleasant side effects which may include dry mouth, dizziness or visual disturbances. 3. Avoid touching the patch. Wash your hands with soap and water after contact with the patch.

## 2018-10-20 NOTE — Telephone Encounter (Signed)
RN left voicemail for patient to return call [id number] and dignicap.

## 2018-10-21 ENCOUNTER — Other Ambulatory Visit (HOSPITAL_COMMUNITY): Payer: Self-pay | Admitting: Hematology and Oncology

## 2018-10-21 ENCOUNTER — Encounter: Payer: Self-pay | Admitting: Radiation Oncology

## 2018-10-21 ENCOUNTER — Ambulatory Visit
Admission: RE | Admit: 2018-10-21 | Discharge: 2018-10-21 | Disposition: A | Discharge status: Home / Self Care | Payer: BC Managed Care – PPO | Source: Ambulatory Visit | Attending: Radiation Oncology | Admitting: Radiation Oncology

## 2018-10-21 ENCOUNTER — Telehealth: Payer: Self-pay | Admitting: *Deleted

## 2018-10-21 ENCOUNTER — Other Ambulatory Visit: Payer: Self-pay

## 2018-10-21 ENCOUNTER — Telehealth: Payer: Self-pay | Admitting: Hematology and Oncology

## 2018-10-21 ENCOUNTER — Telehealth: Payer: Self-pay

## 2018-10-21 ENCOUNTER — Telehealth: Payer: Self-pay | Admitting: Genetic Counselor

## 2018-10-21 VITALS — Ht 64.0 in | Wt 165.0 lb

## 2018-10-21 DIAGNOSIS — C50411 Malignant neoplasm of upper-outer quadrant of right female breast: Secondary | ICD-10-CM

## 2018-10-21 DIAGNOSIS — Z8349 Family history of other endocrine, nutritional and metabolic diseases: Secondary | ICD-10-CM | POA: Diagnosis not present

## 2018-10-21 DIAGNOSIS — Z171 Estrogen receptor negative status [ER-]: Secondary | ICD-10-CM

## 2018-10-21 DIAGNOSIS — Z803 Family history of malignant neoplasm of breast: Secondary | ICD-10-CM | POA: Diagnosis not present

## 2018-10-21 DIAGNOSIS — Z006 Encounter for examination for normal comparison and control in clinical research program: Secondary | ICD-10-CM

## 2018-10-21 NOTE — Telephone Encounter (Signed)
I talk with patient regarding current schedule

## 2018-10-21 NOTE — Telephone Encounter (Signed)
LVM. Asked patient if she had any questions or concerns regarding the [place] clinical trial. Provided this RN's phone number for patient to call back at her earliest convenience.  Johney Maine RN, BSN Clinical Research  10/21/18 11:18 AM

## 2018-10-21 NOTE — Telephone Encounter (Signed)
Called pt and discussed tx care plan. Discussed dignicap. Pt informed she has already ordered cap and card. Denies further questions or needs. Encourage pt to call with concerns. Received verbal understanding. Contact information provided.

## 2018-10-21 NOTE — Progress Notes (Signed)
Radiation Oncology         ([phone]) [phone] ________________________________  Name: [name]        MRN: [id number]  Date of Service: 10/21/2018 DOB: [birth year]  BL:[id number], Barth Kirks, PA-C  Rolm Bookbinder, MD     REFERRING PHYSICIAN: Rolm Bookbinder, MD   DIAGNOSIS: The encounter diagnosis was Malignant neoplasm of upper-outer quadrant of right breast in female, estrogen receptor negative ([place]).   HISTORY OF PRESENT ILLNESS: [name] is a 38 y.o. female seen in the multidisciplinary breast clinic for a new diagnosis of HER2 amplified right breast cancer. The patient was noted to have a palpable lump in the right breast and diagnostic mammogram and ultrasound revealed a 1.5 cm mass in the breast. A biopsy on 10/11/2018 revealed a grade 3, invasive ductal carcinoma, ER negative, PR negative, HER2 amplified with a Ki 67 of 20%. She underwent an MRI of the breast yesterday revealing a 1.5 x .9 x 1.6 cm mass in the right breast consistent with her biopsy proven malignancy at 10:00. No axillary adenopathy was noted. No abnormalities were noted in the left breast. She underwent a PAC placement with Dr. [name], and met with Dr. Lindi Adie and plans to being neoadjuvant chemotherapy. She is seen via Mychart telemedicine to discuss the role of radiotherapy.    PREVIOUS RADIATION THERAPY: No   PAST MEDICAL HISTORY:  Past Medical History:  Diagnosis Date   Anxiety    over cancer diagnosis   Cancer ([place]) 10/2018   breast cancer       PAST SURGICAL HISTORY: Past Surgical History:  Procedure Laterality Date   CERVICAL FUSION  2019   CESAREAN SECTION  2005   CESAREAN SECTION  02/27/2011   Procedure: CESAREAN SECTION;  Surgeon: Shon Millet II;  Location: [facility];  Service: Gynecology;  Laterality: N/A;  repeat     FAMILY HISTORY:  Family History  Problem Relation Age of Onset   Breast cancer Paternal Grandmother    Breast cancer Other    Breast cancer Other       SOCIAL HISTORY:  reports that she has never smoked. She has never used smokeless tobacco. She reports that she does not drink alcohol or use drugs. The patient is married and lives in [place]. 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 She has a rising 2nd grade aged son, and rising 10th grade son.    ALLERGIES: Patient has no known allergies.   MEDICATIONS:  Current Outpatient Medications  Medication Sig Dispense Refill   ALPRAZolam (XANAX) 0.5 MG tablet Take 0.5 mg by mouth at bedtime as needed for anxiety.     lidocaine-prilocaine (EMLA) cream Apply to affected area once 30 g 3   LORazepam (ATIVAN) 0.5 MG tablet Take 1 tablet (0.5 mg total) by mouth at bedtime as needed for sleep. 30 tablet 0   ondansetron (ZOFRAN) 8 MG tablet Take 1 tablet (8 mg total) by mouth 2 (two) times daily as needed (Nausea or vomiting). 30 tablet 1   prochlorperazine (COMPAZINE) 10 MG tablet Take 1 tablet (10 mg total) by mouth every 6 (six) hours as needed (Nausea or vomiting). 30 tablet 1   No current facility-administered medications for this encounter.      REVIEW OF SYSTEMS: On review of systems, the patient reports that she is doing well overall. She denies any chest pain, shortness of breath, cough, fevers, chills, night sweats, unintended weight  changes. She reports her hands and eyes are most bothered by her autoimmune issues. She denies any bowel or bladder disturbances, and denies abdominal pain, nausea or vomiting. She denies any new musculoskeletal or joint aches or pains. A complete review of systems is obtained and is otherwise negative.     PHYSICAL EXAM:  Wt Readings from Last 3 Encounters:  10/21/18 165 lb (74.8 kg)  10/20/18 165 lb 5.5 oz (75 kg)  10/19/18 165 lb (74.8 kg)   Temp Readings from Last 3 Encounters:  10/20/18 98.4 F (36.9 C) (Oral)  10/19/18 98 F (36.7 C) (Oral)  03/01/11 98.1 F (36.7  C) (Oral)   BP Readings from Last 3 Encounters:  10/20/18 115/70  10/19/18 137/83  03/01/11 104/66   Pulse Readings from Last 3 Encounters:  10/20/18 70  10/19/18 77  03/01/11 87    In general this is a well appearing caucasian female in no acute distress. She's alert and oriented x4 and appropriate throughout the examination. Cardiopulmonary assessment is negative for acute distress and she exhibits normal effort. Breast exam is deferred.    ECOG = 0  0 - Asymptomatic (Fully active, able to carry on all predisease activities without restriction)  1 - Symptomatic but completely ambulatory (Restricted in physically strenuous activity but ambulatory and able to carry out work of a light or sedentary nature. For example, light housework, office work)  2 - Symptomatic, <50% in bed during the day (Ambulatory and capable of all self care but unable to carry out any work activities. Up and about more than 50% of waking hours)  3 - Symptomatic, >50% in bed, but not bedbound (Capable of only limited self-care, confined to bed or chair 50% or more of waking hours)  4 - Bedbound (Completely disabled. Cannot carry on any self-care. Totally confined to bed or chair)  5 - Death   Eustace Pen MM, Creech RH, Tormey DC, et al. [phone]). "Toxicity and response criteria of the [facility]". [place] Oncol. 5 (6): 649-55    LABORATORY DATA:  Lab Results  Component Value Date   WBC 9.9 02/28/2011   HGB 10.0 (L) 02/28/2011   HCT 29.6 (L) 02/28/2011   MCV 90.5 02/28/2011   PLT 177 02/28/2011   Lab Results  Component Value Date   NA 133 (L) 10/17/2010   K 4.1 10/17/2010   CL 100 10/17/2010   CO2 23 10/17/2010   Lab Results  Component Value Date   ALT 12 10/17/2010   AST 15 10/17/2010   ALKPHOS 51 10/17/2010   BILITOT 0.1 (L) 10/17/2010      RADIOGRAPHY: Mr Breast Bilateral W Wo Contrast Inc Cad  Result Date: 10/20/2018 CLINICAL DATA:  38 year old female with  recently diagnosed grade 3 invasive ductal carcinoma of the right breast post ultrasound-guided biopsy of a 1.6 cm mass the 10 o'clock position. LABS:  Not applicable. EXAM: BILATERAL BREAST MRI WITH AND WITHOUT CONTRAST TECHNIQUE: Multiplanar, multisequence MR images of both breasts were obtained prior to and following the intravenous administration of 8 ml of Gadavist Three-dimensional MR images were rendered by post-processing of the original MR data on an independent workstation. The three-dimensional MR images were interpreted, and findings are reported in the following complete MRI report for this study. Three dimensional images were evaluated at the independent DynaCad workstation COMPARISON:  Previous exam(s). FINDINGS: Breast composition: c.  Heterogeneous fibroglandular tissue. Background parenchymal enhancement: There is moderate enhancement of the background breast parenchyma. Multiple bilateral  cysts are present. Right breast: Biopsy proven malignancy in the upper-outer quadrant of the right breast at the 10 o'clock position measures 1.5 cm AP, 0.9 cm transverse, and 1.6 cm craniocaudal. No additional enhancing masses or abnormal areas of enhancement in the right breast to suggest other sites of disease. Left breast: No suspicious enhancing masses or abnormal areas of enhancement seen in the left breast. Lymph nodes: No morphologically abnormal axillary lymph nodes. No internal mammary lymphadenopathy. Ancillary findings:  None. IMPRESSION: 1. Biopsy proven malignancy in the right breast at the 10 o'clock position measures 1.5 x 0.9 x 1.6 cm. No additional abnormal areas of enhancement in the right breast and no axillary lymphadenopathy. 2. No MRI evidence of malignancy in the left breast. RECOMMENDATION: Treatment plan for known right breast malignancy. BI-RADS CATEGORY  6: Known biopsy-proven malignancy. Electronically Signed   By: Everlean Alstrom M.D.   On: 10/20/2018 10:06   Dg Chest Port 1  View  Result Date: 10/20/2018 CLINICAL DATA:  Port-A-Cath placement. EXAM: PORTABLE CHEST 1 VIEW COMPARISON:  None. FINDINGS: There is a right-sided Port-A-Cath with the distal tip in the central SVC. No pneumothorax. No nodule, mass, or focal infiltrate. The cardiomediastinal silhouette is unremarkable given the portable technique. IMPRESSION: No active disease. Electronically Signed   By: Dorise Bullion III M.D   On: 10/20/2018 12:41   Dg Cyndy Freeze Guide Cv Line-no Report  Result Date: 10/20/2018 Fluoroscopy was utilized by the requesting physician.  No radiographic interpretation.   US Breast Ltd Uni Left Inc Axilla  Result Date: 10/06/2018 CLINICAL DATA:  38 year old female with palpable RIGHT breast lump identified on self-examination. Baseline mammogram. EXAM: DIGITAL DIAGNOSTIC BILATERAL MAMMOGRAM WITH CAD AND TOMO ULTRASOUND BILATERAL BREAST COMPARISON:  None ACR Breast Density Category c: The breast tissue is heterogeneously dense, which may obscure small masses. FINDINGS: 2D/3D full field and spot compression views of both breasts demonstrate no definite focal abnormality within the RIGHT breast. A focal asymmetry within the OUTER LEFT breast on full field views demonstrates near complete effacement on spot compression views. No other suspicious abnormalities noted. Mammographic images were processed with CAD. On physical exam, focal thickening at the 10 o'clock position of the RIGHT breast 7 cm from the nipple is identified. Targeted ultrasound is performed, showing a 1.2 x 0.8 x 1.6 cm irregular hypoechoic mass with internal vascularity at the 10 o'clock position of the RIGHT breast 7 cm from the nipple. No abnormal RIGHT axillary lymph nodes are identified. Several simple cysts within the LOWER and OUTER LEFT breast are identified, the largest measuring 1.1 x 0.5 x 0.9 cm at the 3 o'clock position 3 cm from the nipple. IMPRESSION: 1. Suspicious 1.6 cm mass in the UPPER-OUTER RIGHT breast  corresponding to the patient's palpable abnormality. Tissue sampling recommended. No abnormal RIGHT axillary lymph nodes. 2. Benign cysts within the LOWER and OUTER LEFT breast. RECOMMENDATION: Ultrasound-guided RIGHT breast biopsy, which will be scheduled. I have discussed the findings and recommendations with the patient. If applicable, a reminder letter will be sent to the patient regarding the next appointment. BI-RADS CATEGORY  4: Suspicious. Electronically Signed   By: Margarette Canada M.D.   On: 10/06/2018 15:22   US Breast Ltd Uni Right Inc Axilla  Result Date: 10/06/2018 CLINICAL DATA:  38 year old female with palpable RIGHT breast lump identified on self-examination. Baseline mammogram. EXAM: DIGITAL DIAGNOSTIC BILATERAL MAMMOGRAM WITH CAD AND TOMO ULTRASOUND BILATERAL BREAST COMPARISON:  None ACR Breast Density Category c: The breast tissue is heterogeneously  dense, which may obscure small masses. FINDINGS: 2D/3D full field and spot compression views of both breasts demonstrate no definite focal abnormality within the RIGHT breast. A focal asymmetry within the OUTER LEFT breast on full field views demonstrates near complete effacement on spot compression views. No other suspicious abnormalities noted. Mammographic images were processed with CAD. On physical exam, focal thickening at the 10 o'clock position of the RIGHT breast 7 cm from the nipple is identified. Targeted ultrasound is performed, showing a 1.2 x 0.8 x 1.6 cm irregular hypoechoic mass with internal vascularity at the 10 o'clock position of the RIGHT breast 7 cm from the nipple. No abnormal RIGHT axillary lymph nodes are identified. Several simple cysts within the LOWER and OUTER LEFT breast are identified, the largest measuring 1.1 x 0.5 x 0.9 cm at the 3 o'clock position 3 cm from the nipple. IMPRESSION: 1. Suspicious 1.6 cm mass in the UPPER-OUTER RIGHT breast corresponding to the patient's palpable abnormality. Tissue sampling recommended.  No abnormal RIGHT axillary lymph nodes. 2. Benign cysts within the LOWER and OUTER LEFT breast. RECOMMENDATION: Ultrasound-guided RIGHT breast biopsy, which will be scheduled. I have discussed the findings and recommendations with the patient. If applicable, a reminder letter will be sent to the patient regarding the next appointment. BI-RADS CATEGORY  4: Suspicious. Electronically Signed   By: Margarette Canada M.D.   On: 10/06/2018 15:22   Mm Diag Breast Tomo Bilateral  Result Date: 10/06/2018 CLINICAL DATA:  38 year old female with palpable RIGHT breast lump identified on self-examination. Baseline mammogram. EXAM: DIGITAL DIAGNOSTIC BILATERAL MAMMOGRAM WITH CAD AND TOMO ULTRASOUND BILATERAL BREAST COMPARISON:  None ACR Breast Density Category c: The breast tissue is heterogeneously dense, which may obscure small masses. FINDINGS: 2D/3D full field and spot compression views of both breasts demonstrate no definite focal abnormality within the RIGHT breast. A focal asymmetry within the OUTER LEFT breast on full field views demonstrates near complete effacement on spot compression views. No other suspicious abnormalities noted. Mammographic images were processed with CAD. On physical exam, focal thickening at the 10 o'clock position of the RIGHT breast 7 cm from the nipple is identified. Targeted ultrasound is performed, showing a 1.2 x 0.8 x 1.6 cm irregular hypoechoic mass with internal vascularity at the 10 o'clock position of the RIGHT breast 7 cm from the nipple. No abnormal RIGHT axillary lymph nodes are identified. Several simple cysts within the LOWER and OUTER LEFT breast are identified, the largest measuring 1.1 x 0.5 x 0.9 cm at the 3 o'clock position 3 cm from the nipple. IMPRESSION: 1. Suspicious 1.6 cm mass in the UPPER-OUTER RIGHT breast corresponding to the patient's palpable abnormality. Tissue sampling recommended. No abnormal RIGHT axillary lymph nodes. 2. Benign cysts within the LOWER and OUTER LEFT  breast. RECOMMENDATION: Ultrasound-guided RIGHT breast biopsy, which will be scheduled. I have discussed the findings and recommendations with the patient. If applicable, a reminder letter will be sent to the patient regarding the next appointment. BI-RADS CATEGORY  4: Suspicious. Electronically Signed   By: Margarette Canada M.D.   On: 10/06/2018 15:22   Mm Clip Placement Right  Result Date: 10/11/2018 CLINICAL DATA:  Status post ultrasound-guided core biopsy of a right breast mass. EXAM: 3D DIAGNOSTIC RIGHT MAMMOGRAM POST ULTRASOUND BIOPSY COMPARISON:  Previous exam(s). FINDINGS: 3D Mammographic images were obtained following ultrasound guided biopsy of a mass in the 10 o'clock region of the right breast. Mammographic images show there is a ribbon shaped clip in the upper-outer quadrant of  the right breast in appropriate position. IMPRESSION: Status post ultrasound-guided core biopsy of the right breast with pathology pending. Final Assessment: Post Procedure Mammograms for Marker Placement Electronically Signed   By: Lillia Mountain M.D.   On: 10/11/2018 08:22   [place] Rt Breast Bx W Loc Dev 1st Lesion Img Bx Spec US Guide  Addendum Date: 10/13/2018   ADDENDUM REPORT: 10/13/2018 08:25 ADDENDUM: Pathology revealed GRADE III INVASIVE DUCTAL CARCINOMA of the Right breast, 10 o'clock. This was found to be concordant by Dr. Lillia Mountain. Pathology results were discussed with the patient by telephone. The patient reported doing well after the biopsy with tenderness at the site. Post biopsy instructions and care were reviewed and questions were answered. The patient was encouraged to call [facility] for any additional concerns. Surgical consultation has been arranged with Dr. Rolm Bookbinder, per patient request, at [facility] on October 22, 2018. Pathology results reported by Terie Purser, RN on 10/13/2018. Electronically Signed   By: Lillia Mountain M.D.   On: 10/13/2018 08:25   Result  Date: 10/13/2018 CLINICAL DATA:  Suspicious right breast mass EXAM: ULTRASOUND GUIDED RIGHT BREAST CORE NEEDLE BIOPSY COMPARISON:  Previous exam(s). FINDINGS: I met with the patient and we discussed the procedure of ultrasound-guided biopsy, including benefits and alternatives. We discussed the high likelihood of a successful procedure. We discussed the risks of the procedure, including infection, bleeding, tissue injury, clip migration, and inadequate sampling. Informed written consent was given. The usual time-out protocol was performed immediately prior to the procedure. Lesion quadrant: Upper-outer quadrant Using sterile technique and 1% Lidocaine as local anesthetic, under direct ultrasound visualization, a 12 gauge spring-loaded device was used to perform biopsy of a mass in the 10 o'clock region of the right breast using a medial to lateral approach. At the conclusion of the procedure a ribbon shaped tissue marker clip was deployed into the biopsy cavity. Follow up 2 view mammogram was performed and dictated separately. IMPRESSION: Ultrasound guided biopsy of the right.  No apparent complications. Electronically Signed: By: Lillia Mountain M.D. On: 10/11/2018 08:19       IMPRESSION/PLAN: 1. Stage IA, [id number], grade 3 HER2 amplified invasive ductal carcinoma of the right breast. Dr. Lisbeth Renshaw discusses the pathology findings and reviews the nature of HER2 amplified right breast disease. The consensus from the breast conference includes proceeding with neoadjuvant systemic therapy. She is considering her surgical options, and we will follow up with the results of her decision making. We discussed the risks, benefits, short, and long term effects of radiotherapy. Dr. Lisbeth Renshaw discusses the delivery and logistics of radiotherapy as it relates to her history of autoimmune disease. He would recommend avoiding radiotherapy due to this risk, and since the patient  2. Complex history of autoimmune disorders. The patient  has had two panels of tests in her 20's that indicated Sjogren's disease, and two that did not. Despite this has always had a positive ANA test. She also has symptoms in her hands, and autoimmune issues of her eye/retina and sees opthalmology for management of this. She has not had any systemic or other localized flare to require seeing rheumatology in the last 15 or so years.  3. Possible genetic predisposition to malignancy. The patient is a candidate for genetic testing given her young age and family history  of cancer. Her family history of rheumatologic and autoimmune issues are also certainly a concern. She was offered referral and will be set up for formal  evaluation. She would like to be tested with blood from her [facility] when she goes for chemotherapy.    This encounter was provided by telemedicine platform Mychart. The patient has given verbal consent for this type of encounter and has been advised to only accept a meeting of this type in a secure network environment. The time spent during this encounter was 60 minutes. The attendants for this meeting include Blenda Nicely, RN, Dr. Lisbeth Renshaw, Hayden Pedro  and Drue Dun.  During the encounter,  Blenda Nicely, RN, Dr. Lisbeth Renshaw, and Hayden Pedro were located at [facility] Radiation Oncology Department.  CHELCIE ESTORGA was located at home.    The above documentation reflects my direct findings during this shared patient visit. Please see the separate note by Dr. Lisbeth Renshaw on this date for the remainder of the patient's plan of care.    Carola Rhine, PAC

## 2018-10-21 NOTE — Telephone Encounter (Signed)
Returned patient's phone call. Patient wishes to proceed with enrollment to UPBEAT clinical trial. Patient agreed to come in at 1000 on Thursday June 25th to sign consent and complete baseline assessments. Thanked patient for her interest. Johney Maine RN, BSN Clinical Research  10/21/18 4:01 PM

## 2018-10-21 NOTE — Telephone Encounter (Signed)
[name] and scheduled a genetic counseling appt on 6/22 at 8am. [name] was ok with a webex visit. I explained to her that someone will be calling her to get her setup. Msg fwd to [facility] to call to setup webex visit.

## 2018-10-21 NOTE — Anesthesia Postprocedure Evaluation (Signed)
Anesthesia Post Note  Patient: [name]  Procedure(s) Performed: INSERTION PORT-A-CATH WITH ULTRASOUND (Right Chest)     Patient location during evaluation: PACU Anesthesia Type: General Level of consciousness: awake and alert Pain management: pain level controlled Vital Signs Assessment: post-procedure vital signs reviewed and stable Respiratory status: spontaneous breathing, nonlabored ventilation, respiratory function stable and patient connected to nasal cannula oxygen Cardiovascular status: blood pressure returned to baseline and stable Postop Assessment: no apparent nausea or vomiting Anesthetic complications: no    Last Vitals:  Vitals:   10/20/18 1245 10/20/18 1319  BP: 115/75 115/70  Pulse: 71 70  Resp: 17 20  Temp:  36.9 C  SpO2: 97% 99%                  [name]

## 2018-10-22 ENCOUNTER — Ambulatory Visit (HOSPITAL_COMMUNITY)
Admission: RE | Admit: 2018-10-22 | Discharge: 2018-10-22 | Disposition: A | Discharge status: Home / Self Care | Payer: BC Managed Care – PPO | Source: Ambulatory Visit | Attending: Hematology and Oncology | Admitting: Hematology and Oncology

## 2018-10-22 ENCOUNTER — Telehealth: Payer: Self-pay | Admitting: Genetic Counselor

## 2018-10-22 ENCOUNTER — Telehealth: Payer: Self-pay | Admitting: Hematology and Oncology

## 2018-10-22 ENCOUNTER — Inpatient Hospital Stay: Payer: BC Managed Care – PPO

## 2018-10-22 DIAGNOSIS — Z171 Estrogen receptor negative status [ER-]: Secondary | ICD-10-CM | POA: Diagnosis not present

## 2018-10-22 DIAGNOSIS — C50411 Malignant neoplasm of upper-outer quadrant of right female breast: Secondary | ICD-10-CM

## 2018-10-22 NOTE — Progress Notes (Signed)
  Echocardiogram 2D Echocardiogram has been performed.  Constantina Laseter G Tahliyah Anagnos 10/22/2018, 11:39 AM

## 2018-10-22 NOTE — Telephone Encounter (Signed)
Called patient regarding upcoming Webex appointment, patient is notified and e-mail has been sent. °

## 2018-10-22 NOTE — Telephone Encounter (Signed)
Called pt on Friday to remind about ph call on Monday am an no answer. Could not leave mess on voice mail it was full.

## 2018-10-25 ENCOUNTER — Inpatient Hospital Stay (HOSPITAL_BASED_OUTPATIENT_CLINIC_OR_DEPARTMENT_OTHER): Payer: BC Managed Care – PPO | Admitting: Genetic Counselor

## 2018-10-25 ENCOUNTER — Other Ambulatory Visit: Payer: Self-pay | Admitting: Genetic Counselor

## 2018-10-25 ENCOUNTER — Encounter: Payer: Self-pay | Admitting: Genetic Counselor

## 2018-10-25 ENCOUNTER — Encounter: Payer: Self-pay | Admitting: *Deleted

## 2018-10-25 DIAGNOSIS — C50411 Malignant neoplasm of upper-outer quadrant of right female breast: Secondary | ICD-10-CM

## 2018-10-25 DIAGNOSIS — Z1379 Encounter for other screening for genetic and chromosomal anomalies: Secondary | ICD-10-CM

## 2018-10-25 DIAGNOSIS — Z171 Estrogen receptor negative status [ER-]: Secondary | ICD-10-CM

## 2018-10-25 DIAGNOSIS — Z803 Family history of malignant neoplasm of breast: Secondary | ICD-10-CM

## 2018-10-25 NOTE — Progress Notes (Signed)
REFERRING PROVIDER: [name], MD [place],  [place] [id number]  PRIMARY PROVIDER:  [name], PA-C  PRIMARY REASON FOR VISIT:  1. Family history of breast cancer      HISTORY OF PRESENT ILLNESS:   I connected with [name] on 10/25/2018 at 8:00 AM EDT by Webex video conference and verified that I am speaking with the correct person using two identifiers.   Patient location: Home Provider location: Office  [name], a 38 y.o. female, was seen for a [place] cancer genetics consultation at the request of Dr. Lindi Adie due to a personal and family history of breast cancer.  [name] presents to clinic today to discuss the possibility of a hereditary predisposition to cancer, genetic testing, and to further clarify her future cancer risks, as well as potential cancer risks for family members.   In June 2020, at the age of 61, [name] was diagnosed with invasive ductal carcinoma of the right breast. The treatment plan chemotherapy, lumpectomy and radiation.     CANCER HISTORY:  Oncology History  Malignant neoplasm of upper-outer quadrant of right breast in female, estrogen receptor negative ([place])  10/11/2018 Initial Diagnosis   Patient palpated a right breast mass, mammogram revealed 1.6 cm mass, biopsy revealed grade 3 invasive ductal carcinoma ER 0%, PR 0%, Ki-67 20%, HER-2 3+ positive, T1CN0 stage IA   10/19/2018 Cancer Staging   Staging form: Breast, AJCC 8th Edition - Clinical stage from 10/19/2018: Stage IA (cT1c, cN0, cM0, G3, ER-, PR-, HER2+) - Signed by [name], MD on 10/19/2018   10/28/2018 -  Chemotherapy   The patient had trastuzumab (HERCEPTIN) 294 mg in sodium chloride 0.9 % 250 mL chemo infusion, 4 mg/kg = 294 mg, Intravenous,  Once, 0 of 3 cycles PACLitaxel (TAXOL) 150 mg in sodium chloride 0.9 % 250 mL chemo infusion (</= 71m/m2), 80 mg/m2 = 150 mg, Intravenous,  Once, 0 of 3 cycles  for chemotherapy treatment.       RISK  FACTORS:  Menarche was at age 38  First live birth at age 89  OCP use for approximately 20 years.  Ovaries intact: yes.  Hysterectomy: no.  Menopausal status: premenopausal.  HRT use: 0 years. Colonoscopy: no; not examined. Mammogram within the last year: yes. Number of breast biopsies: 1. Up to date with pelvic exams: yes. Any excessive radiation exposure in the past: no  Past Medical History:  Diagnosis Date   Anxiety    over cancer diagnosis   [place]cer [place] 10[place]   breast cancer   Family history of breast cancer     Past Surgical History:  Procedure Laterality Date   CERVICAL FUSION  2019   CESAREAN SECTION  2005   CESAREAN SECTION  02/27/2011   Procedure: CESAREAN SECTION;  Surgeon: JShon MilletII;  [facility];  Service: Gynecology;  Laterality: N/A;  repeat   PORTACATH PLACEMENT Right 10/20/2018   Procedure: INSERTION PORT-A-CATH WITH ULTRASOUND;  Surgeon: WRolm Bookbinder MD;  Location[facility]  Service: General;  Laterality: Right;    Social History   Socioeconomic History   Marital status: Married    Spouse name: Not on file   Number of children: Not on file   Years of education: Not on file   Highest education level: Not on file  Occupational History   Not on file  Social Needs   Financial resource strain: Not on file   Food insecurity    Worry:  Not on file    Inability: Not on file   Transportation needs    Medical: No    Non-medical: No  Tobacco Use   Smoking status: Never Smoker   Smokeless tobacco: Never Used  Substance and Sexual Activity   Alcohol use: No   Drug use: No   Sexual activity: Yes    Birth control/protection: Surgical    Comment: husband has had vasectomy  Lifestyle   Physical activity    Days per week: Not on file    Minutes per session: Not on file   Stress: Not on file  Relationships   Social connections    Talks on phone: Not on file    Gets together: Not on file      Attends religious service: Not on file    Active member of club or organization: Not on file    Attends meetings of clubs or organizations: Not on file    Relationship status: Not on file  Other Topics Concern   Not on file  Social History Narrative   Not on file     FAMILY HISTORY:  We obtained a detailed, 4-generation family history.  Significant diagnoses are listed below: Family History  Problem Relation Age of Onset   Breast cancer Paternal Grandmother    Breast cancer Other    Breast cancer Other    Hashimoto's thyroiditis Son    Ulcerative colitis Niece     The patient has two sons who are cancer free. She has a maternal half sister, and paternal half brother who are cancer free.  Both parents are living.  The patient's mother is living at 36.  She has two brothers who are cancer free.  The maternal grandparents are both deceased from non cancer related issues.  The patient's father is living at 31.  He had a brother and sister who are cancer free.  His parents are both deceased.  The paternal grandmother had two sisters who each had breast cancer, one at age 78 and the other at 25.  The paternal great grandmother also had breast cancer in her 83's.  [name] is unaware of previous family history of genetic testing for hereditary cancer risks. Patient's maternal ancestors are of [place] descent, and paternal ancestors are of Native American descent. There is no reported Ashkenazi Jewish ancestry. There is no known consanguinity.    GENETIC COUNSELING ASSESSMENT: [name] is a 38 y.o. female with a personal and family history of breast cancer which is somewhat suggestive of a hereditary cancer syndrome and predisposition to cancer. We, therefore, discussed and recommended the following at today's visit.   DISCUSSION: We discussed that 5 - 10% of breast cancer is hereditary, with most cases associated with BRCA mutations.  There are other genes that can be associated  with hereditary breast cancer syndromes.  These include ATM, CHEK2, and PALB2.  We discussed that testing is beneficial for several reasons including knowing how to follow individuals after completing their treatment, identifying whether potential treatment options such as PARP inhibitors would be beneficial, and understand if other family members could be at risk for cancer and allow them to undergo genetic testing.   We reviewed the characteristics, features and inheritance patterns of hereditary cancer syndromes. We also discussed genetic testing, including the appropriate family members to test, the process of testing, insurance coverage and turn-around-time for results. We discussed the implications of a negative, positive and/or variant of uncertain significant result. We recommended [name]  pursue genetic testing for the common hereditary cancer gene panel. The Common Hereditary Gene Panel offered by [facility] includes sequencing and/or deletion duplication testing of the following 48 genes: APC, ATM, AXIN2, BARD1, BMPR1A, BRCA1, BRCA2, BRIP1, CDH1, CDK4, CDKN2A (p14ARF), CDKN2A (p16INK4a), CHEK2, CTNNA1, DICER1, EPCAM (Deletion/duplication testing only), GREM1 (promoter region deletion/duplication testing only), KIT, MEN1, MLH1, MSH2, MSH3, MSH6, MUTYH, NBN, NF1, NHTL1, PALB2, PDGFRA, PMS2, POLD1, POLE, PTEN, RAD50, RAD51C, RAD51D, RNF43, SDHB, SDHC, SDHD, SMAD4, SMARCA4. STK11, TP53, TSC1, TSC2, and VHL.  The following genes were evaluated for sequence changes only: SDHA and HOXB13 c.251G>A variant only.   Based on [name] personal and family history of cancer, she meets medical criteria for genetic testing. Despite that she meets criteria, she may still have an out of pocket cost. We discussed that if her out of pocket cost for testing is over $100, the laboratory will call and confirm whether she wants to proceed with testing.  If the out of pocket cost of testing is less than $100 she will be  billed by the genetic testing laboratory.   PLAN: After considering the risks, benefits, and limitations, [name] provided informed consent to pursue genetic testing and the blood sample was sent to [facility] for analysis of the common hereditary cancer panel. Results should be available within approximately 2-3 weeks' time, at which point they will be disclosed by telephone to [name], as will any additional recommendations warranted by these results. [name] will receive a summary of her genetic counseling visit and a copy of her results once available. This information will also be available in Epic.   Lastly, we encouraged [name] to remain in contact with cancer genetics annually so that we can continuously update the family history and inform her of any changes in cancer genetics and testing that may be of benefit for this family.   [name] questions were answered to her satisfaction today. Our contact information was provided should additional questions or concerns arise. Thank you for the referral and allowing [place] to share in the care of your patient.   Der Gagliano P. Florene Glen, [place], [facility] Certified Genetic Counselor [email or web address] phone: [phone]  The patient was seen for a total of 38 minutes in face-to-face genetic counseling.  This patient was discussed with Drs. Magrinat, Lindi Adie and/or Burr Medico who agrees with the above.    _______________________________________________________________________ For Office Staff:  Number of people involved in session: 1 Was an Intern/ student involved with case: no

## 2018-10-25 NOTE — Assessment & Plan Note (Signed)
10/11/2018:Patient palpated a right breast mass, mammogram revealed 1.6 cm mass, biopsy revealed grade 3 invasive ductal carcinoma ER 0%, PR 0%, Ki-67 20%, HER-2 3+ positive, T1CN0 stage IA  Treatment plan: 1.  Neoadjuvant chemotherapy with Taxol Herceptin weekly x12 followed by Herceptin maintenance 2.  Breast conserving surgery with sentinel lymph node biopsy 3.  Adjuvant radiation therapy  Echocardiogram 10/22/2018: EF 55 to 60% Labs reviewed Antiemetics reviewed Chemo consent obtained Chemo education completed  Return to clinic in 1 week for toxicity check and for cycle 2

## 2018-10-28 ENCOUNTER — Ambulatory Visit (HOSPITAL_COMMUNITY)
Admission: RE | Admit: 2018-10-28 | Discharge: 2018-10-28 | Disposition: A | Discharge status: Home / Self Care | Payer: Self-pay | Source: Ambulatory Visit | Attending: Hematology and Oncology | Admitting: Hematology and Oncology

## 2018-10-28 ENCOUNTER — Inpatient Hospital Stay: Payer: BC Managed Care – PPO

## 2018-10-28 ENCOUNTER — Other Ambulatory Visit: Payer: Self-pay

## 2018-10-28 ENCOUNTER — Telehealth: Payer: Self-pay

## 2018-10-28 DIAGNOSIS — C50411 Malignant neoplasm of upper-outer quadrant of right female breast: Secondary | ICD-10-CM

## 2018-10-28 DIAGNOSIS — Z006 Encounter for examination for normal comparison and control in clinical research program: Secondary | ICD-10-CM | POA: Insufficient documentation

## 2018-10-28 DIAGNOSIS — Z171 Estrogen receptor negative status [ER-]: Secondary | ICD-10-CM

## 2018-10-28 NOTE — Assessment & Plan Note (Signed)
10/11/2018:Patient palpated a right breast mass, mammogram revealed 1.6 cm mass, biopsy revealed grade 3 invasive ductal carcinoma ER 0%, PR 0%, Ki-67 20%, HER-2 3+ positive, T1CN0 stage IA  Treatment plan: 1. Neoadjuvant Taxol and Herceptin weekly x12.  Followed by Herceptin maintenance therapy every 3 weeks to complete 1 year. 2.  Breast conserving surgery with sentinel lymph node biopsy 3.  Adjuvant radiation therapy Upbeat clinical trial: No adverse effects from participating in the trial ---------------------------------------------------------------------------------------------------------------------------------------- Current treatment: Cycle 1 day 1 Taxol Herceptin Labs reviewed Echocardiogram: 10/22/2018: EF 55 to 60% Antiemetics were reviewed Chemo consent obtained Chemo education completed  I will see the patient back next week for toxicity evaluation for cycle 2

## 2018-10-28 NOTE — Progress Notes (Signed)
[id number] Baseline  Pt arrived unaccompanied to sign consent for UPBEAT clinical trial and complete baseline assessments. Pt had been previously given consent and authorization forms to review at her leisure. This RN reviewed both page by page. Explained the purpose of the study, the patient's choice to take part in the study, the benefits and risks, required procedures, costs of taking part in the study and how her medical information is protected. Provided patient with information on where she can get more information. Explained to patient about optional studies. Pt confirmed understanding and denied any questions. Pt signed consent and authorization form at 1030 am. She also signed a release of information form. This RN then administered neurocognitive assessments to patient. Patient then completed self administered questionnaires. While patient completed questionnaires, this RN registered patient via OPEN. Eligibility was confirmed by research RN, Gloris Manchester, on 10/25/2018. Patient was registered and assigned PID of [id number]. This RN then checked patient's questionnaires for completeness. The patient scored a 21 on the depression symptom screening. This RN sent a message to Dr. Lindi Adie to let him know. The patient then completed the physical function tests. Patient's MRI will be completed this afternoon. Pt's VS, labs and waist measurement will be collected tomorrow. Thanked patient for her time and participation and told her to contact this RN or Dr. Lindi Adie with any questions she may have.  Johney Maine RN, BSN Clinical Research  10/28/18 1:38 PM

## 2018-10-28 NOTE — Telephone Encounter (Signed)
LVM regarding patient not showing up for 1000 appt. With this RN. Asked patient to call this RN back at her earliest convenience. Johney Maine RN, BSN Clinical Research  10/28/18

## 2018-10-28 NOTE — Progress Notes (Signed)
Patient Care Team: [place], Barth Kirks, PA-C as PCP - General (Nurse Practitioner)  DIAGNOSIS:    ICD-10-CM   1. Malignant neoplasm of upper-outer quadrant of right breast in female, estrogen receptor negative ([place])  C50.411    Z17.1     SUMMARY OF ONCOLOGIC HISTORY: Oncology History  Malignant neoplasm of upper-outer quadrant of right breast in female, estrogen receptor negative ([place])  10/11/2018 Initial Diagnosis   Patient palpated a right breast mass, mammogram revealed 1.6 cm mass, biopsy revealed grade 3 invasive ductal carcinoma ER 0%, PR 0%, Ki-67 20%, HER-2 3+ positive, T1CN0 stage IA   10/19/2018 Cancer Staging   Staging form: Breast, AJCC 8th Edition - Clinical stage from 10/19/2018: Stage IA (cT1c, cN0, cM0, G3, ER-, PR-, HER2+) - Signed by [name], MD on 10/19/2018   10/29/2018 -  Chemotherapy   The patient had trastuzumab (HERCEPTIN) 294 mg in sodium chloride 0.9 % 250 mL chemo infusion, 4 mg/kg = 294 mg, Intravenous,  Once, 0 of 3 cycles PACLitaxel (TAXOL) 150 mg in sodium chloride 0.9 % 250 mL chemo infusion (</= 43m/m2), 80 mg/m2 = 150 mg, Intravenous,  Once, 0 of 3 cycles  for chemotherapy treatment.      CHIEF COMPLIANT: Cycle 1 Taxol Herceptin  INTERVAL HISTORY: Teresa WEDEMEYERis a 38y.o. with above-mentioned history of right breast cancer. Her port was placed on 10/20/18 by Dr. WDonne Hazel ECHO on 10/22/18 showed an ejection fraction in the range of 55-60%. She is a participant in the UpBeat clinical trial. Genetic testing is pending. She presents to the clinic today to begin neoadjuvant chemotherapy with weekly Taxol and Herceptin.   REVIEW OF SYSTEMS:   Constitutional: Denies fevers, chills or abnormal weight loss Eyes: Denies blurriness of vision Ears, nose, mouth, throat, and face: Denies mucositis or sore throat Respiratory: Denies cough, dyspnea or wheezes Cardiovascular: Denies palpitation, chest discomfort Gastrointestinal: Denies nausea, heartburn or  change in bowel habits Skin: Denies abnormal skin rashes Lymphatics: Denies new lymphadenopathy or easy bruising Neurological: Denies numbness, tingling or new weaknesses Behavioral/Psych: Mood is stable, no new changes  Extremities: No lower extremity edema Breast: denies any pain or lumps or nodules in either breasts All other systems were reviewed with the patient and are negative.  I have reviewed the past medical history, past surgical history, social history and family history with the patient and they are unchanged from previous note.  ALLERGIES:  has No Known Allergies.  MEDICATIONS:  Current Outpatient Medications  Medication Sig Dispense Refill  . ALPRAZolam (XANAX) 0.5 MG tablet Take 0.5 mg by mouth at bedtime as needed for anxiety.    . lidocaine-prilocaine (EMLA) cream Apply to affected area once 30 g 3  . LORazepam (ATIVAN) 0.5 MG tablet Take 1 tablet (0.5 mg total) by mouth at bedtime as needed for sleep. 30 tablet 0  . ondansetron (ZOFRAN) 8 MG tablet Take 1 tablet (8 mg total) by mouth 2 (two) times daily as needed (Nausea or vomiting). 30 tablet 1  . prochlorperazine (COMPAZINE) 10 MG tablet Take 1 tablet (10 mg total) by mouth every 6 (six) hours as needed (Nausea or vomiting). 30 tablet 1   No current facility-administered medications for this visit.     PHYSICAL EXAMINATION: ECOG PERFORMANCE STATUS: 1 - Symptomatic but completely ambulatory  There were no vitals filed for this visit. There were no vitals filed for this visit.  GENERAL: alert, no distress and comfortable SKIN: skin color, texture, turgor are normal, no rashes  or significant lesions EYES: normal, Conjunctiva are pink and non-injected, sclera clear OROPHARYNX: no exudate, no erythema and lips, buccal mucosa, and tongue normal  NECK: supple, thyroid normal size, non-tender, without nodularity LYMPH: no palpable lymphadenopathy in the cervical, axillary or inguinal LUNGS: clear to auscultation and  percussion with normal breathing effort HEART: regular rate & rhythm and no murmurs and no lower extremity edema ABDOMEN: abdomen soft, non-tender and normal bowel sounds MUSCULOSKELETAL: no cyanosis of digits and no clubbing  NEURO: alert & oriented x 3 with fluent speech, no focal motor/sensory deficits EXTREMITIES: No lower extremity edema  LABORATORY DATA:  I have reviewed the data as listed CMP Latest Ref Rng & Units 10/17/2010  Glucose 70 - 99 mg/dL 95  BUN 6 - 23 mg/dL 5(L)  Creatinine 0.4 - 1.2 mg/dL <0.47  Sodium 135 - 145 mEq/L 133(L)  Potassium 3.5 - 5.1 mEq/L 4.1  Chloride 96 - 112 mEq/L 100  CO2 19 - 32 mEq/L 23  Calcium 8.4 - 10.5 mg/dL 9.1  Total Protein 6.0 - 8.3 g/dL 6.4  Total Bilirubin 0.3 - 1.2 mg/dL 0.1(L)  Alkaline Phos 39 - 117 U/L 51  AST 0 - 37 U/L 15  ALT 0 - 35 U/L 12    Lab Results  Component Value Date   WBC 9.9 02/28/2011   HGB 10.0 (L) 02/28/2011   HCT 29.6 (L) 02/28/2011   MCV 90.5 02/28/2011   PLT 177 02/28/2011   NEUTROABS 7.7 10/17/2010    ASSESSMENT & PLAN:  Malignant neoplasm of upper-outer quadrant of right breast in female, estrogen receptor negative ([place]) 10/11/2018:Patient palpated a right breast mass, mammogram revealed 1.6 cm mass, biopsy revealed grade 3 invasive ductal carcinoma ER 0%, PR 0%, Ki-67 20%, HER-2 3+ positive, T1CN0 stage IA  Treatment plan: 1.  Neoadjuvant chemotherapy with Taxol Herceptin weekly x12 followed by Herceptin maintenance 2.  Breast conserving surgery with sentinel lymph node biopsy 3.  Adjuvant radiation therapy  Echocardiogram 10/22/2018: EF 55 to 60% Labs reviewed Antiemetics reviewed Chemo consent obtained Chemo education completed  Return to clinic in 1 week for toxicity check and for cycle 2   No orders of the defined types were placed in this encounter.  The patient has a good understanding of the overall plan. she agrees with it. she will call with any problems that may develop before the  next visit here.  [name], MD 10/29/2018  Julious Oka Dorshimer am acting as scribe for Dr. [name].  I have reviewed the above documentation for accuracy and completeness, and I agree with the above.

## 2018-10-29 ENCOUNTER — Inpatient Hospital Stay: Payer: BC Managed Care – PPO

## 2018-10-29 ENCOUNTER — Telehealth: Payer: Self-pay | Admitting: *Deleted

## 2018-10-29 ENCOUNTER — Encounter: Payer: Self-pay | Admitting: *Deleted

## 2018-10-29 ENCOUNTER — Other Ambulatory Visit: Payer: Self-pay

## 2018-10-29 ENCOUNTER — Ambulatory Visit: Payer: BC Managed Care – PPO

## 2018-10-29 ENCOUNTER — Inpatient Hospital Stay: Payer: BC Managed Care – PPO | Admitting: Hematology and Oncology

## 2018-10-29 VITALS — BP 118/82 | HR 73 | Temp 98.2°F | Resp 16

## 2018-10-29 DIAGNOSIS — Z171 Estrogen receptor negative status [ER-]: Secondary | ICD-10-CM | POA: Diagnosis not present

## 2018-10-29 DIAGNOSIS — C50411 Malignant neoplasm of upper-outer quadrant of right female breast: Secondary | ICD-10-CM | POA: Diagnosis not present

## 2018-10-29 DIAGNOSIS — F419 Anxiety disorder, unspecified: Secondary | ICD-10-CM | POA: Diagnosis not present

## 2018-10-29 DIAGNOSIS — L659 Nonscarring hair loss, unspecified: Secondary | ICD-10-CM | POA: Diagnosis not present

## 2018-10-29 DIAGNOSIS — Z5112 Encounter for antineoplastic immunotherapy: Secondary | ICD-10-CM | POA: Diagnosis not present

## 2018-10-29 DIAGNOSIS — C969 Malignant neoplasm of lymphoid, hematopoietic and related tissue, unspecified: Secondary | ICD-10-CM | POA: Diagnosis not present

## 2018-10-29 DIAGNOSIS — Z79899 Other long term (current) drug therapy: Secondary | ICD-10-CM

## 2018-10-29 DIAGNOSIS — Z803 Family history of malignant neoplasm of breast: Secondary | ICD-10-CM | POA: Diagnosis not present

## 2018-10-29 DIAGNOSIS — Z5111 Encounter for antineoplastic chemotherapy: Secondary | ICD-10-CM | POA: Diagnosis not present

## 2018-10-29 LAB — CBC WITH DIFFERENTIAL (CANCER CENTER ONLY)
Abs Immature Granulocytes: 0.01 10*3/uL (ref 0.00–0.07)
Basophils Absolute: 0 10*3/uL (ref 0.0–0.1)
Basophils Relative: 0 %
Eosinophils Absolute: 0.1 10*3/uL (ref 0.0–0.5)
Eosinophils Relative: 1 %
HCT: 37.9 % (ref 36.0–46.0)
Hemoglobin: 12.5 g/dL (ref 12.0–15.0)
Immature Granulocytes: 0 %
Lymphocytes Relative: 24 %
Lymphs Abs: 1.5 10*3/uL (ref 0.7–4.0)
MCH: 30.7 pg (ref 26.0–34.0)
MCHC: 33 g/dL (ref 30.0–36.0)
MCV: 93.1 fL (ref 80.0–100.0)
Monocytes Absolute: 0.4 10*3/uL (ref 0.1–1.0)
Monocytes Relative: 6 %
Neutro Abs: 4.2 10*3/uL (ref 1.7–7.7)
Neutrophils Relative %: 69 %
Platelet Count: 200 10*3/uL (ref 150–400)
RBC: 4.07 MIL/uL (ref 3.87–5.11)
RDW: 12.5 % (ref 11.5–15.5)
WBC Count: 6.2 10*3/uL (ref 4.0–10.5)
nRBC: 0 % (ref 0.0–0.2)

## 2018-10-29 LAB — CMP (CANCER CENTER ONLY)
ALT: 17 U/L (ref 0–44)
AST: 13 U/L — ABNORMAL LOW (ref 15–41)
Albumin: 3.7 g/dL (ref 3.5–5.0)
Alkaline Phosphatase: 66 U/L (ref 38–126)
Anion gap: 10 (ref 5–15)
BUN: 8 mg/dL (ref 6–20)
CO2: 23 mmol/L (ref 22–32)
Calcium: 8.4 mg/dL — ABNORMAL LOW (ref 8.9–10.3)
Chloride: 108 mmol/L (ref 98–111)
Creatinine: 0.77 mg/dL (ref 0.44–1.00)
GFR, Est AFR Am: >60 mL/min (ref >60)
GFR, Estimated: >60 mL/min (ref >60)
Glucose, Bld: 100 mg/dL — ABNORMAL HIGH (ref 70–99)
Potassium: 4 mmol/L (ref 3.5–5.1)
Sodium: 141 mmol/L (ref 135–145)
Total Bilirubin: 0.2 mg/dL — ABNORMAL LOW (ref 0.3–1.2)
Total Protein: 6.7 g/dL (ref 6.5–8.1)

## 2018-10-29 LAB — RESEARCH LABS

## 2018-10-29 MED ORDER — ACETAMINOPHEN 325 MG PO TABS
650.0000 mg | ORAL_TABLET | Freq: Once | ORAL | Status: AC
  Administered 2018-10-29: 650 mg via ORAL

## 2018-10-29 MED ORDER — SODIUM CHLORIDE 0.9 % IV SOLN
80.0000 mg/m2 | Freq: Once | INTRAVENOUS | Status: AC
  Administered 2018-10-29: 150 mg via INTRAVENOUS
  Filled 2018-10-29: qty 25

## 2018-10-29 MED ORDER — FAMOTIDINE IN NACL 20-0.9 MG/50ML-% IV SOLN
INTRAVENOUS | Status: AC
  Filled 2018-10-29: qty 50

## 2018-10-29 MED ORDER — DEXAMETHASONE SODIUM PHOSPHATE 10 MG/ML IJ SOLN
10.0000 mg | Freq: Once | INTRAMUSCULAR | Status: AC
  Administered 2018-10-29: 10 mg via INTRAVENOUS

## 2018-10-29 MED ORDER — DIPHENHYDRAMINE HCL 50 MG/ML IJ SOLN
25.0000 mg | Freq: Once | INTRAMUSCULAR | Status: AC
  Administered 2018-10-29: 25 mg via INTRAVENOUS

## 2018-10-29 MED ORDER — HEPARIN SOD (PORK) LOCK FLUSH 100 UNIT/ML IV SOLN
500.0000 [IU] | Freq: Once | INTRAVENOUS | Status: AC | PRN
  Administered 2018-10-29: 500 [IU]
  Filled 2018-10-29: qty 5

## 2018-10-29 MED ORDER — ACETAMINOPHEN 325 MG PO TABS
ORAL_TABLET | ORAL | Status: AC
  Filled 2018-10-29: qty 2

## 2018-10-29 MED ORDER — TRASTUZUMAB CHEMO 150 MG IV SOLR
300.0000 mg | Freq: Once | INTRAVENOUS | Status: AC
  Administered 2018-10-29: 300 mg via INTRAVENOUS
  Filled 2018-10-29: qty 14.29

## 2018-10-29 MED ORDER — DIPHENHYDRAMINE HCL 50 MG/ML IJ SOLN
INTRAMUSCULAR | Status: AC
  Filled 2018-10-29: qty 1

## 2018-10-29 MED ORDER — SODIUM CHLORIDE 0.9 % IV SOLN: 10.0000 mg | Freq: Once | INTRAVENOUS | Status: DC

## 2018-10-29 MED ORDER — SODIUM CHLORIDE 0.9 % IV SOLN
Freq: Once | INTRAVENOUS | Status: AC
  Administered 2018-10-29: 10:00:00 via INTRAVENOUS
  Filled 2018-10-29: qty 250

## 2018-10-29 MED ORDER — DEXAMETHASONE SODIUM PHOSPHATE 10 MG/ML IJ SOLN
INTRAMUSCULAR | Status: AC
  Filled 2018-10-29: qty 1

## 2018-10-29 MED ORDER — FAMOTIDINE IN NACL 20-0.9 MG/50ML-% IV SOLN
20.0000 mg | Freq: Once | INTRAVENOUS | Status: AC
  Administered 2018-10-29: 20 mg via INTRAVENOUS

## 2018-10-29 MED ORDER — SODIUM CHLORIDE 0.9% FLUSH
10.0000 mL | INTRAVENOUS | Status: DC | PRN
  Administered 2018-10-29: 10 mL
  Filled 2018-10-29: qty 10

## 2018-10-29 NOTE — Progress Notes (Signed)
UPBEAT  Baseline  10/29/18 Patient arrived today without fasting for Baseline labs.  3 hr fasting required by protocol.  Patient reports last eating at 7:30am.  Infusion team will draw labs immediately prior to administering first infusion this morning to allow additional time to elapse to meet 3hr requirement.  Doreatha Martin, RN, BSN, [facility] 10/29/2018 8:46 AM

## 2018-10-29 NOTE — Telephone Encounter (Signed)
Called pt to assess for needs or questions prior to 1st Taxol/herceptin. Vm left requesting return call with any questions or needs. Contact information provied.

## 2018-10-29 NOTE — Progress Notes (Signed)
Visited pt in infusion room to see if she had any questions/concerns.  She states she does not.  Gave her written Taxol information & Chemo Alert Card that were missing from her Ed packet.  She did received formal education on these items already.

## 2018-10-29 NOTE — Research (Signed)
[id number] Upbeat Study; Baseline Assessments- Met patient prior to her appointment with Dr. Lindi Adie this morning. Introduced myself and explained need for VS and Waist measurement to be done per study protocol.  Patient agreed.  VS; Completed per protocol. Patient rested for 5 minutes prior to 1st BP and for another minute between BPs.  See VS flow sheet. Waist measurement; 34.5 inches.  Research Labs; Will be drawn in infusion room this morning prior to starting chemotherapy.  Thanked patient for her participation in this study.  Foye Spurling, BSN, RN Clinical Research Nurse 10/29/2018 9:00 AM

## 2018-10-29 NOTE — Patient Instructions (Signed)
[facility] Discharge Instructions for Patients Receiving Chemotherapy  Today you received the following chemotherapy agents Herceptin and Taxol  To help prevent nausea and vomiting after your treatment, we encourage you to take your nausea medication as prescribed.   If you develop nausea and vomiting that is not controlled by your nausea medication, call the clinic.   BELOW ARE SYMPTOMS THAT SHOULD BE REPORTED IMMEDIATELY:  *FEVER GREATER THAN 100.5 F  *CHILLS WITH OR WITHOUT FEVER  NAUSEA AND VOMITING THAT IS NOT CONTROLLED WITH YOUR NAUSEA MEDICATION  *UNUSUAL SHORTNESS OF BREATH  *UNUSUAL BRUISING OR BLEEDING  TENDERNESS IN MOUTH AND THROAT WITH OR WITHOUT PRESENCE OF ULCERS  *URINARY PROBLEMS  *BOWEL PROBLEMS  UNUSUAL RASH Items with * indicate a potential emergency and should be followed up as soon as possible.  Feel free to call the clinic should you have any questions or concerns. The clinic phone number is [phone].  Please show the [facility] at check-in to the Emergency Department and triage nurse.  Trastuzumab injection for infusion  (Herceptin) What is this medicine? TRASTUZUMAB (tras TOO zoo mab) is a monoclonal antibody. It is used to treat breast cancer and stomach cancer. This medicine may be used for other purposes; ask your health care provider or pharmacist if you have questions. COMMON BRAND NAME(S): Herceptin, Calla Kicks, OGIVRI What should I tell my health care provider before I take this medicine? They need to know if you have any of these conditions: -heart disease -heart failure -lung or breathing disease, like asthma -an unusual or allergic reaction to trastuzumab, benzyl alcohol, or other medications, foods, dyes, or preservatives -pregnant or trying to get pregnant -breast-feeding How should I use this medicine? This drug is given as an infusion into a vein. It is administered in a hospital or clinic by a specially  trained health care professional. Talk to your pediatrician regarding the use of this medicine in children. This medicine is not approved for use in children. Overdosage: If you think you have taken too much of this medicine contact a poison control center or emergency room at once. NOTE: This medicine is only for you. Do not share this medicine with others. What if I miss a dose? It is important not to miss a dose. Call your doctor or health care professional if you are unable to keep an appointment. What may interact with this medicine? This medicine may interact with the following medications: -certain types of chemotherapy, such as daunorubicin, doxorubicin, epirubicin, and idarubicin This list may not describe all possible interactions. Give your health care provider a list of all the medicines, herbs, non-prescription drugs, or dietary supplements you use. Also tell them if you smoke, drink alcohol, or use illegal drugs. Some items may interact with your medicine. What should I watch for while using this medicine? Visit your doctor for checks on your progress. Report any side effects. Continue your course of treatment even though you feel ill unless your doctor tells you to stop. Call your doctor or health care professional for advice if you get a fever, chills or sore throat, or other symptoms of a cold or flu. Do not treat yourself. Try to avoid being around people who are sick. You may experience fever, chills and shaking during your first infusion. These effects are usually mild and can be treated with other medicines. Report any side effects during the infusion to your health care professional. Fever and chills usually do not happen with later  infusions. Do not become pregnant while taking this medicine or for 7 months after stopping it. Women should inform their doctor if they wish to become pregnant or think they might be pregnant. Women of child-bearing potential will need to have a  negative pregnancy test before starting this medicine. There is a potential for serious side effects to an unborn child. Talk to your health care professional or pharmacist for more information. Do not breast-feed an infant while taking this medicine or for 7 months after stopping it. Women must use effective birth control with this medicine. What side effects may I notice from receiving this medicine? Side effects that you should report to your doctor or health care professional as soon as possible: -allergic reactions like skin rash, itching or hives, swelling of the face, lips, or tongue -chest pain or palpitations -cough -dizziness -feeling faint or lightheaded, falls -fever -general ill feeling or flu-like symptoms -signs of worsening heart failure like breathing problems; swelling in your legs and feet -unusually weak or tired Side effects that usually do not require medical attention (report to your doctor or health care professional if they continue or are bothersome): -bone pain -changes in taste -diarrhea -joint pain -nausea/vomiting -weight loss This list may not describe all possible side effects. Call your doctor for medical advice about side effects. You may report side effects to FDA at 1-800-FDA-1088. Where should I keep my medicine? This drug is given in a hospital or clinic and will not be stored at home. NOTE: This sheet is a summary. It may not cover all possible information. If you have questions about this medicine, talk to your doctor, pharmacist, or health care provider.  2019 Elsevier/Gold Standard (2016-04-15 14:37:52)  Paclitaxel injection (Taxol) What is this medicine? PACLITAXEL (PAK li TAX el) is a chemotherapy drug. It targets fast dividing cells, like cancer cells, and causes these cells to die. This medicine is used to treat ovarian cancer, breast cancer, lung cancer, Kaposi's sarcoma, and other cancers. This medicine may be used for other purposes; ask  your health care provider or pharmacist if you have questions. COMMON BRAND NAME(S): Onxol, Taxol What should I tell my health care provider before I take this medicine? They need to know if you have any of these conditions: -history of irregular heartbeat -liver disease -low blood counts, like low white cell, platelet, or red cell counts -lung or breathing disease, like asthma -tingling of the fingers or toes, or other nerve disorder -an unusual or allergic reaction to paclitaxel, alcohol, polyoxyethylated castor oil, other chemotherapy, other medicines, foods, dyes, or preservatives -pregnant or trying to get pregnant -breast-feeding How should I use this medicine? This drug is given as an infusion into a vein. It is administered in a hospital or clinic by a specially trained health care professional. Talk to your pediatrician regarding the use of this medicine in children. Special care may be needed. Overdosage: If you think you have taken too much of this medicine contact a poison control center or emergency room at once. NOTE: This medicine is only for you. Do not share this medicine with others. What if I miss a dose? It is important not to miss your dose. Call your doctor or health care professional if you are unable to keep an appointment. What may interact with this medicine? Do not take this medicine with any of the following medications: -disulfiram -metronidazole This medicine may also interact with the following medications: -antiviral medicines for hepatitis, HIV or AIDS -certain  antibiotics like erythromycin and clarithromycin -certain medicines for fungal infections like ketoconazole and itraconazole -certain medicines for seizures like carbamazepine, phenobarbital, phenytoin -gemfibrozil -nefazodone -rifampin -St. John's wort This list may not describe all possible interactions. Give your health care provider a list of all the medicines, herbs, non-prescription drugs,  or dietary supplements you use. Also tell them if you smoke, drink alcohol, or use illegal drugs. Some items may interact with your medicine. What should I watch for while using this medicine? Your condition will be monitored carefully while you are receiving this medicine. You will need important blood work done while you are taking this medicine. This medicine can cause serious allergic reactions. To reduce your risk you will need to take other medicine(s) before treatment with this medicine. If you experience allergic reactions like skin rash, itching or hives, swelling of the face, lips, or tongue, tell your doctor or health care professional right away. In some cases, you may be given additional medicines to help with side effects. Follow all directions for their use. This drug may make you feel generally unwell. This is not uncommon, as chemotherapy can affect healthy cells as well as cancer cells. Report any side effects. Continue your course of treatment even though you feel ill unless your doctor tells you to stop. Call your doctor or health care professional for advice if you get a fever, chills or sore throat, or other symptoms of a cold or flu. Do not treat yourself. This drug decreases your body's ability to fight infections. Try to avoid being around people who are sick. This medicine may increase your risk to bruise or bleed. Call your doctor or health care professional if you notice any unusual bleeding. Be careful brushing and flossing your teeth or using a toothpick because you may get an infection or bleed more easily. If you have any dental work done, tell your dentist you are receiving this medicine. Avoid taking products that contain aspirin, acetaminophen, ibuprofen, naproxen, or ketoprofen unless instructed by your doctor. These medicines may hide a fever. Do not become pregnant while taking this medicine. Women should inform their doctor if they wish to become pregnant or think  they might be pregnant. There is a potential for serious side effects to an unborn child. Talk to your health care professional or pharmacist for more information. Do not breast-feed an infant while taking this medicine. Men are advised not to father a child while receiving this medicine. This product may contain alcohol. Ask your pharmacist or healthcare provider if this medicine contains alcohol. Be sure to tell all healthcare providers you are taking this medicine. Certain medicines, like metronidazole and disulfiram, can cause an unpleasant reaction when taken with alcohol. The reaction includes flushing, headache, nausea, vomiting, sweating, and increased thirst. The reaction can last from 30 minutes to several hours. What side effects may I notice from receiving this medicine? Side effects that you should report to your doctor or health care professional as soon as possible: -allergic reactions like skin rash, itching or hives, swelling of the face, lips, or tongue -breathing problems -changes in vision -fast, irregular heartbeat -high or low blood pressure -mouth sores -pain, tingling, numbness in the hands or feet -signs of decreased platelets or bleeding - bruising, pinpoint red spots on the skin, black, tarry stools, blood in the urine -signs of decreased red blood cells - unusually weak or tired, feeling faint or lightheaded, falls -signs of infection - fever or chills, cough, sore throat, pain or  difficulty passing urine -signs and symptoms of liver injury like dark yellow or brown urine; general ill feeling or flu-like symptoms; light-colored stools; loss of appetite; nausea; right upper belly pain; unusually weak or tired; yellowing of the eyes or skin -swelling of the ankles, feet, hands -unusually slow heartbeat Side effects that usually do not require medical attention (report to your doctor or health care professional if they continue or are bothersome): -diarrhea -hair  loss -loss of appetite -muscle or joint pain -nausea, vomiting -pain, redness, or irritation at site where injected -tiredness This list may not describe all possible side effects. Call your doctor for medical advice about side effects. You may report side effects to FDA at 1-800-FDA-1088. Where should I keep my medicine? This drug is given in a hospital or clinic and will not be stored at home. NOTE: This sheet is a summary. It may not cover all possible information. If you have questions about this medicine, talk to your doctor, pharmacist, or health care provider.  2019 Elsevier/Gold Standard (2016-12-23 13:14:55)  Coronavirus (COVID-19) Are you at risk?  Are you at risk for the Coronavirus (COVID-19)?  To be considered HIGH RISK for Coronavirus (COVID-19), you have to meet the following criteria:  . Traveled to [place], [place], [place], [place] or [place]; or in the [place] to [place], [place], [place], or [place]; and have fever, cough, and shortness of breath within the last 2 weeks of travel OR . Been in close contact with a person diagnosed with COVID-19 within the last 2 weeks and have fever, cough, and shortness of breath . IF YOU DO NOT MEET THESE CRITERIA, YOU ARE CONSIDERED LOW RISK FOR COVID-19.  What to do if you are HIGH RISK for COVID-19?  Marland Kitchen If you are having a medical emergency, call 911. . Seek medical care right away. Before you go to a doctor's office, urgent care or emergency department, call ahead and tell them about your recent travel, contact with someone diagnosed with COVID-19, and your symptoms. You should receive instructions from your physician's office regarding next steps of care.  . When you arrive at healthcare provider, tell the healthcare staff immediately you have returned from visiting [place], [place], [place], [place] or [place]; or traveled in the [place] to [place], [place], [place], or [place]; in the last two weeks or you  have been in close contact with a person diagnosed with COVID-19 in the last 2 weeks.   . Tell the health care staff about your symptoms: fever, cough and shortness of breath. . After you have been seen by a medical provider, you will be either: o Tested for (COVID-19) and discharged home on quarantine except to seek medical care if symptoms worsen, and asked to  - Stay home and avoid contact with others until you get your results (4-5 days)  - Avoid travel on public transportation if possible (such as bus, train, or airplane) or o Sent to the Emergency Department by EMS for evaluation, COVID-19 testing, and possible admission depending on your condition and test results.  What to do if you are LOW RISK for COVID-19?  Reduce your risk of any infection by using the same precautions used for avoiding the common cold or flu:  Marland Kitchen Wash your hands often with soap and warm water for at least 20 seconds.  If soap and water are not readily available, use an alcohol-based hand sanitizer with at least 60% alcohol.  . If  coughing or sneezing, cover your mouth and nose by coughing or sneezing into the elbow areas of your shirt or coat, into a tissue or into your sleeve (not your hands). . Avoid shaking hands with others and consider head nods or verbal greetings only. . Avoid touching your eyes, nose, or mouth with unwashed hands.  . Avoid close contact with people who are sick. . Avoid places or events with large numbers of people in one location, like concerts or sporting events. . Carefully consider travel plans you have or are making. . If you are planning any travel outside or inside the [place], visit the CDC's Travelers' Health webpage for the latest health notices. . If you have some symptoms but not all symptoms, continue to monitor at home and seek medical attention if your symptoms worsen. . If you are having a medical emergency, call 911.   [facility] / e-Visit: [email or web address]         [facility]: [facility]: [phone]                   [facility]: [phone]

## 2018-11-01 ENCOUNTER — Telehealth: Payer: Self-pay

## 2018-11-01 NOTE — Telephone Encounter (Signed)
RN left voicemail for return call regarding first time chemo follow up.

## 2018-11-02 NOTE — Progress Notes (Signed)
Patient Care Team: Redmon, Barth Kirks, PA-C as PCP - General (Nurse Practitioner)  DIAGNOSIS:    ICD-10-CM   1. Malignant neoplasm of upper-outer quadrant of right breast in female, estrogen receptor negative ([place])  C50.411    Z17.1     SUMMARY OF ONCOLOGIC HISTORY: Oncology History  Malignant neoplasm of upper-outer quadrant of right breast in female, estrogen receptor negative ([facility])  10/11/2018 Initial Diagnosis   Patient palpated a right breast mass, mammogram revealed 1.6 cm mass, biopsy revealed grade 3 invasive ductal carcinoma ER 0%, PR 0%, Ki-67 20%, HER-2 3+ positive, T1CN0 stage IA   10/19/2018 Cancer Staging   Staging form: Breast, AJCC 8th Edition - Clinical stage from 10/19/2018: Stage IA (cT1c, cN0, cM0, G3, ER-, PR-, HER2+) - Signed by [name], MD on 10/19/2018   10/29/2018 -  Chemotherapy   The patient had trastuzumab (HERCEPTIN) 300 mg in sodium chloride 0.9 % 250 mL chemo infusion, 294 mg, Intravenous,  Once, 1 of 3 cycles Administration: 300 mg (10/29/2018) PACLitaxel (TAXOL) 150 mg in sodium chloride 0.9 % 250 mL chemo infusion (</= 61m/m2), 80 mg/m2 = 150 mg, Intravenous,  Once, 1 of 3 cycles Administration: 150 mg (10/29/2018)  for chemotherapy treatment.      CHIEF COMPLIANT: Cycle 2 Taxol Herceptin  INTERVAL HISTORY: Teresa JURCZYKis a 38y.o. with above-mentioned history of right breast cancer currently on neoadjuvant chemotherapy with weekly Taxol and Herceptin. She presents to the clinic today for a toxicity check following cycle 1 and for cycle 2.  She tolerated cycle 1 extremely well.  She had constipation that lasted 3 to 4 days.  She had mild nausea on first day after chemo.  She is using cold cap and icing her hands and feet.  REVIEW OF SYSTEMS:   Constitutional: Denies fevers, chills or abnormal weight loss Eyes: Denies blurriness of vision Ears, nose, mouth, throat, and face: Denies mucositis or sore throat Respiratory: Denies cough, dyspnea or  wheezes Cardiovascular: Denies palpitation, chest discomfort Gastrointestinal: Mild nausea that lasted for 1 day and severe constipation Skin: Denies abnormal skin rashes Lymphatics: Denies new lymphadenopathy or easy bruising Neurological: Denies numbness, tingling or new weaknesses Behavioral/Psych: Mood is stable, no new changes  Extremities: No lower extremity edema Breast: denies any pain or lumps or nodules in either breasts All other systems were reviewed with the patient and are negative.  I have reviewed the past medical history, past surgical history, social history and family history with the patient and they are unchanged from previous note.  ALLERGIES:  has No Known Allergies.  MEDICATIONS:  Current Outpatient Medications  Medication Sig Dispense Refill  . lidocaine-prilocaine (EMLA) cream Apply to affected area once 30 g 3  . LORazepam (ATIVAN) 0.5 MG tablet Take 1 tablet (0.5 mg total) by mouth at bedtime as needed for sleep. 30 tablet 0  . ondansetron (ZOFRAN) 8 MG tablet Take 1 tablet (8 mg total) by mouth 2 (two) times daily as needed (Nausea or vomiting). 30 tablet 1  . prochlorperazine (COMPAZINE) 10 MG tablet Take 1 tablet (10 mg total) by mouth every 6 (six) hours as needed (Nausea or vomiting). 30 tablet 1   No current facility-administered medications for this visit.     PHYSICAL EXAMINATION: ECOG PERFORMANCE STATUS: 1 - Symptomatic but completely ambulatory  Vitals:   11/03/18 1131  BP: 121/73  Pulse: 78  Resp: 18  Temp: 98.9 F (37.2 C)  SpO2: 99%   Filed Weights   11/03/18  1131  Weight: 166 lb 6.4 oz (75.5 kg)    GENERAL: alert, no distress and comfortable SKIN: skin color, texture, turgor are normal, no rashes or significant lesions EYES: normal, Conjunctiva are pink and non-injected, sclera clear OROPHARYNX: no exudate, no erythema and lips, buccal mucosa, and tongue normal  NECK: supple, thyroid normal size, non-tender, without nodularity  LYMPH: no palpable lymphadenopathy in the cervical, axillary or inguinal LUNGS: clear to auscultation and percussion with normal breathing effort HEART: regular rate & rhythm and no murmurs and no lower extremity edema ABDOMEN: abdomen soft, non-tender and normal bowel sounds MUSCULOSKELETAL: no cyanosis of digits and no clubbing  NEURO: alert & oriented x 3 with fluent speech, no focal motor/sensory deficits EXTREMITIES: No lower extremity edema  LABORATORY DATA:  I have reviewed the data as listed CMP Latest Ref Rng & Units 10/29/2018 10/17/2010  Glucose 70 - 99 mg/dL 100(H) 95  BUN 6 - 20 mg/dL 8 5(L)  Creatinine 0.44 - 1.00 mg/dL 0.77 <0.47  Sodium 135 - 145 mmol/L 141 133(L)  Potassium 3.5 - 5.1 mmol/L 4.0 4.1  Chloride 98 - 111 mmol/L 108 100  CO2 22 - 32 mmol/L 23 23  Calcium 8.9 - 10.3 mg/dL 8.4(L) 9.1  Total Protein 6.5 - 8.1 g/dL 6.7 6.4  Total Bilirubin 0.3 - 1.2 mg/dL 0.2(L) 0.1(L)  Alkaline Phos 38 - 126 U/L 66 51  AST 15 - 41 U/L 13(L) 15  ALT 0 - 44 U/L 17 12    Lab Results  Component Value Date   WBC 6.3 11/03/2018   HGB 12.6 11/03/2018   HCT 37.4 11/03/2018   MCV 90.6 11/03/2018   PLT 215 11/03/2018   NEUTROABS 4.7 11/03/2018    ASSESSMENT & PLAN:  Malignant neoplasm of upper-outer quadrant of right breast in female, estrogen receptor negative ([place]) 10/11/2018:Patient palpated a right breast mass, mammogram revealed 1.6 cm mass, biopsy revealed grade 3 invasive ductal carcinoma ER 0%, PR 0%, Ki-67 20%, HER-2 3+ positive, T1CN0 stage IA  Treatment plan: 1. Neoadjuvant Taxol and Herceptin weekly x12.  Followed by Herceptin maintenance therapy every 3 weeks to complete 1 year. 2.  Breast conserving surgery with sentinel lymph node biopsy 3.  Adjuvant radiation therapy Up[employer] clinical trial: No adverse effects from participating in the trial  ---------------------------------------------------------------------------------------------------------------------------------------- Current treatment: Cycle 2 day 1 Taxol Herceptin Echocardiogram: 10/22/2018: EF 55 to 60%  Chemo toxicities: Mild nausea She is using cold cap as well as icing her hands and feet. She had constipation after first cycle of chemo. I instructed her to take stool softeners from this round.  Return to clinic weekly for chemo and every other week for follow-up with me.   No orders of the defined types were placed in this encounter.  The patient has a good understanding of the overall plan. she agrees with it. she will call with any problems that may develop before the next visit here.  [name], MD 11/03/2018  Julious Oka Dorshimer am acting as scribe for Dr. [name].  I have reviewed the above documentation for accuracy and completeness, and I agree with the above.

## 2018-11-03 ENCOUNTER — Other Ambulatory Visit: Payer: Self-pay

## 2018-11-03 ENCOUNTER — Other Ambulatory Visit: Payer: BC Managed Care – PPO

## 2018-11-03 ENCOUNTER — Inpatient Hospital Stay: Payer: BC Managed Care – PPO | Admitting: Hematology and Oncology

## 2018-11-03 ENCOUNTER — Inpatient Hospital Stay: Payer: BC Managed Care – PPO | Attending: Hematology and Oncology

## 2018-11-03 ENCOUNTER — Ambulatory Visit: Payer: BC Managed Care – PPO

## 2018-11-03 ENCOUNTER — Inpatient Hospital Stay: Payer: BC Managed Care – PPO

## 2018-11-03 DIAGNOSIS — Z79899 Other long term (current) drug therapy: Secondary | ICD-10-CM | POA: Diagnosis not present

## 2018-11-03 DIAGNOSIS — Z171 Estrogen receptor negative status [ER-]: Secondary | ICD-10-CM | POA: Diagnosis not present

## 2018-11-03 DIAGNOSIS — C50411 Malignant neoplasm of upper-outer quadrant of right female breast: Secondary | ICD-10-CM

## 2018-11-03 DIAGNOSIS — Z5111 Encounter for antineoplastic chemotherapy: Secondary | ICD-10-CM | POA: Diagnosis not present

## 2018-11-03 DIAGNOSIS — L659 Nonscarring hair loss, unspecified: Secondary | ICD-10-CM | POA: Diagnosis not present

## 2018-11-03 DIAGNOSIS — Z95828 Presence of other vascular implants and grafts: Secondary | ICD-10-CM | POA: Insufficient documentation

## 2018-11-03 DIAGNOSIS — C969 Malignant neoplasm of lymphoid, hematopoietic and related tissue, unspecified: Secondary | ICD-10-CM | POA: Diagnosis not present

## 2018-11-03 LAB — CMP (CANCER CENTER ONLY)
ALT: 17 U/L (ref 0–44)
AST: 12 U/L — ABNORMAL LOW (ref 15–41)
Albumin: 3.8 g/dL (ref 3.5–5.0)
Alkaline Phosphatase: 63 U/L (ref 38–126)
Anion gap: 9 (ref 5–15)
BUN: 13 mg/dL (ref 6–20)
CO2: 24 mmol/L (ref 22–32)
Calcium: 8.2 mg/dL — ABNORMAL LOW (ref 8.9–10.3)
Chloride: 107 mmol/L (ref 98–111)
Creatinine: 0.89 mg/dL (ref 0.44–1.00)
GFR, Est AFR Am: >60 mL/min (ref >60)
GFR, Estimated: >60 mL/min (ref >60)
Glucose, Bld: 86 mg/dL (ref 70–99)
Potassium: 4.3 mmol/L (ref 3.5–5.1)
Sodium: 140 mmol/L (ref 135–145)
Total Bilirubin: 0.5 mg/dL (ref 0.3–1.2)
Total Protein: 6.9 g/dL (ref 6.5–8.1)

## 2018-11-03 LAB — CBC WITH DIFFERENTIAL (CANCER CENTER ONLY)
Abs Immature Granulocytes: 0.04 10*3/uL (ref 0.00–0.07)
Basophils Absolute: 0 10*3/uL (ref 0.0–0.1)
Basophils Relative: 0 %
Eosinophils Absolute: 0.1 10*3/uL (ref 0.0–0.5)
Eosinophils Relative: 1 %
HCT: 37.4 % (ref 36.0–46.0)
Hemoglobin: 12.6 g/dL (ref 12.0–15.0)
Immature Granulocytes: 1 %
Lymphocytes Relative: 22 %
Lymphs Abs: 1.4 10*3/uL (ref 0.7–4.0)
MCH: 30.5 pg (ref 26.0–34.0)
MCHC: 33.7 g/dL (ref 30.0–36.0)
MCV: 90.6 fL (ref 80.0–100.0)
Monocytes Absolute: 0.2 10*3/uL (ref 0.1–1.0)
Monocytes Relative: 3 %
Neutro Abs: 4.7 10*3/uL (ref 1.7–7.7)
Neutrophils Relative %: 73 %
Platelet Count: 215 10*3/uL (ref 150–400)
RBC: 4.13 MIL/uL (ref 3.87–5.11)
RDW: 12.4 % (ref 11.5–15.5)
WBC Count: 6.3 10*3/uL (ref 4.0–10.5)
nRBC: 0 % (ref 0.0–0.2)

## 2018-11-03 MED ORDER — DEXAMETHASONE SODIUM PHOSPHATE 10 MG/ML IJ SOLN
INTRAMUSCULAR | Status: AC
  Filled 2018-11-03: qty 1

## 2018-11-03 MED ORDER — SODIUM CHLORIDE 0.9 % IV SOLN: 10.0000 mg | Freq: Once | INTRAVENOUS | Status: DC

## 2018-11-03 MED ORDER — FAMOTIDINE IN NACL 20-0.9 MG/50ML-% IV SOLN
20.0000 mg | Freq: Once | INTRAVENOUS | Status: AC
  Administered 2018-11-03: 20 mg via INTRAVENOUS

## 2018-11-03 MED ORDER — DIPHENHYDRAMINE HCL 50 MG/ML IJ SOLN
INTRAMUSCULAR | Status: AC
  Filled 2018-11-03: qty 1

## 2018-11-03 MED ORDER — HEPARIN SOD (PORK) LOCK FLUSH 100 UNIT/ML IV SOLN
500.0000 [IU] | Freq: Once | INTRAVENOUS | Status: AC | PRN
  Administered 2018-11-03: 500 [IU]
  Filled 2018-11-03: qty 5

## 2018-11-03 MED ORDER — SODIUM CHLORIDE 0.9% FLUSH
10.0000 mL | INTRAVENOUS | Status: DC | PRN
  Administered 2018-11-03: 10 mL
  Filled 2018-11-03: qty 10

## 2018-11-03 MED ORDER — SODIUM CHLORIDE 0.9 % IV SOLN
80.0000 mg/m2 | Freq: Once | INTRAVENOUS | Status: AC
  Administered 2018-11-03: 150 mg via INTRAVENOUS
  Filled 2018-11-03: qty 25

## 2018-11-03 MED ORDER — ACETAMINOPHEN 325 MG PO TABS
650.0000 mg | ORAL_TABLET | Freq: Once | ORAL | Status: AC
  Administered 2018-11-03: 650 mg via ORAL

## 2018-11-03 MED ORDER — ACETAMINOPHEN 325 MG PO TABS
ORAL_TABLET | ORAL | Status: AC
  Filled 2018-11-03: qty 2

## 2018-11-03 MED ORDER — SODIUM CHLORIDE 0.9 % IV SOLN
Freq: Once | INTRAVENOUS | Status: AC
  Administered 2018-11-03: 12:00:00 via INTRAVENOUS
  Filled 2018-11-03: qty 250

## 2018-11-03 MED ORDER — TRASTUZUMAB CHEMO 150 MG IV SOLR
150.0000 mg | Freq: Once | INTRAVENOUS | Status: AC
  Administered 2018-11-03: 150 mg via INTRAVENOUS
  Filled 2018-11-03: qty 7.14

## 2018-11-03 MED ORDER — SODIUM CHLORIDE 0.9% FLUSH
10.0000 mL | Freq: Once | INTRAVENOUS | Status: AC
  Administered 2018-11-03: 10 mL
  Filled 2018-11-03: qty 10

## 2018-11-03 MED ORDER — SODIUM CHLORIDE 0.9% FLUSH
10.0000 mL | INTRAVENOUS | Status: DC | PRN
  Filled 2018-11-03: qty 10

## 2018-11-03 MED ORDER — DEXAMETHASONE SODIUM PHOSPHATE 10 MG/ML IJ SOLN
10.0000 mg | Freq: Once | INTRAMUSCULAR | Status: AC
  Administered 2018-11-03: 10 mg via INTRAVENOUS

## 2018-11-03 MED ORDER — DIPHENHYDRAMINE HCL 50 MG/ML IJ SOLN
25.0000 mg | Freq: Once | INTRAMUSCULAR | Status: AC
  Administered 2018-11-03: 25 mg via INTRAVENOUS

## 2018-11-03 MED ORDER — FAMOTIDINE IN NACL 20-0.9 MG/50ML-% IV SOLN
INTRAVENOUS | Status: AC
  Filled 2018-11-03: qty 50

## 2018-11-03 NOTE — Patient Instructions (Signed)
[facility] Discharge Instructions for Patients Receiving Chemotherapy  Today you received the following chemotherapy agents Herceptin/Taxol.  To help prevent nausea and vomiting after your treatment, we encourage you to take your nausea medication as directed.   If you develop nausea and vomiting that is not controlled by your nausea medication, call the clinic.   BELOW ARE SYMPTOMS THAT SHOULD BE REPORTED IMMEDIATELY:  *FEVER GREATER THAN 100.5 F  *CHILLS WITH OR WITHOUT FEVER  NAUSEA AND VOMITING THAT IS NOT CONTROLLED WITH YOUR NAUSEA MEDICATION  *UNUSUAL SHORTNESS OF BREATH  *UNUSUAL BRUISING OR BLEEDING  TENDERNESS IN MOUTH AND THROAT WITH OR WITHOUT PRESENCE OF ULCERS  *URINARY PROBLEMS  *BOWEL PROBLEMS  UNUSUAL RASH Items with * indicate a potential emergency and should be followed up as soon as possible.  Feel free to call the clinic should you have any questions or concerns. The clinic phone number is [phone].  Please show the [facility] at check-in to the Emergency Department and triage nurse.

## 2018-11-03 NOTE — Progress Notes (Signed)
[id number] UPBEAT  Confirmed patient had completed all baseline assessments and dispensed gift card. Thanked patient for her time and participation. Johney Maine RN, BSN Clinical Research  11/03/18 12:38 PM

## 2018-11-03 NOTE — Progress Notes (Signed)
DCP-001  Met with unaccompanied patient in infusion room. Reviewed the consent and authorization forms with the patient in their entirety. Patient is aware that this involves one-time consent and collection of demographic variables, with the majority of data collected from her medical record. Noted that no patient identifiers are being reported via the screening tool. All of her questions were answered and she agrees to participate. The consent and authorization forms were signed and dated by the patient. Copies of the documents were given to the patient. Patient meets eligibility and will be enrolled in the DCP-001 study. Johney Maine RN, BSN Clinical Research  11/03/18 12:46 PM

## 2018-11-03 NOTE — Patient Instructions (Signed)

## 2018-11-04 ENCOUNTER — Encounter: Payer: Self-pay | Admitting: *Deleted

## 2018-11-08 DIAGNOSIS — C50411 Malignant neoplasm of upper-outer quadrant of right female breast: Secondary | ICD-10-CM | POA: Diagnosis not present

## 2018-11-08 DIAGNOSIS — Z171 Estrogen receptor negative status [ER-]: Secondary | ICD-10-CM | POA: Diagnosis not present

## 2018-11-11 ENCOUNTER — Other Ambulatory Visit: Payer: BC Managed Care – PPO

## 2018-11-11 ENCOUNTER — Ambulatory Visit: Payer: BC Managed Care – PPO

## 2018-11-12 ENCOUNTER — Inpatient Hospital Stay: Payer: BC Managed Care – PPO

## 2018-11-12 ENCOUNTER — Telehealth: Payer: Self-pay | Admitting: *Deleted

## 2018-11-12 ENCOUNTER — Other Ambulatory Visit: Payer: Self-pay

## 2018-11-12 VITALS — BP 117/71 | HR 66 | Temp 98.3°F | Resp 18 | Wt 168.2 lb

## 2018-11-12 DIAGNOSIS — Z171 Estrogen receptor negative status [ER-]: Secondary | ICD-10-CM

## 2018-11-12 DIAGNOSIS — L659 Nonscarring hair loss, unspecified: Secondary | ICD-10-CM | POA: Diagnosis not present

## 2018-11-12 DIAGNOSIS — Z5111 Encounter for antineoplastic chemotherapy: Secondary | ICD-10-CM | POA: Diagnosis not present

## 2018-11-12 DIAGNOSIS — C50411 Malignant neoplasm of upper-outer quadrant of right female breast: Secondary | ICD-10-CM

## 2018-11-12 DIAGNOSIS — C969 Malignant neoplasm of lymphoid, hematopoietic and related tissue, unspecified: Secondary | ICD-10-CM | POA: Diagnosis not present

## 2018-11-12 DIAGNOSIS — Z95828 Presence of other vascular implants and grafts: Secondary | ICD-10-CM

## 2018-11-12 DIAGNOSIS — Z79899 Other long term (current) drug therapy: Secondary | ICD-10-CM | POA: Diagnosis not present

## 2018-11-12 LAB — CMP (CANCER CENTER ONLY)
ALT: 12 U/L (ref 0–44)
AST: 11 U/L — ABNORMAL LOW (ref 15–41)
Albumin: 3.9 g/dL (ref 3.5–5.0)
Alkaline Phosphatase: 64 U/L (ref 38–126)
Anion gap: 7 (ref 5–15)
BUN: 18 mg/dL (ref 6–20)
CO2: 26 mmol/L (ref 22–32)
Calcium: 9 mg/dL (ref 8.9–10.3)
Chloride: 107 mmol/L (ref 98–111)
Creatinine: 0.78 mg/dL (ref 0.44–1.00)
GFR, Est AFR Am: >60 mL/min (ref >60)
GFR, Estimated: >60 mL/min (ref >60)
Glucose, Bld: 95 mg/dL (ref 70–99)
Potassium: 4.3 mmol/L (ref 3.5–5.1)
Sodium: 140 mmol/L (ref 135–145)
Total Bilirubin: 0.4 mg/dL (ref 0.3–1.2)
Total Protein: 7 g/dL (ref 6.5–8.1)

## 2018-11-12 LAB — CBC WITH DIFFERENTIAL (CANCER CENTER ONLY)
Abs Immature Granulocytes: 0.01 10*3/uL (ref 0.00–0.07)
Basophils Absolute: 0 10*3/uL (ref 0.0–0.1)
Basophils Relative: 0 %
Eosinophils Absolute: 0.1 10*3/uL (ref 0.0–0.5)
Eosinophils Relative: 1 %
HCT: 37.9 % (ref 36.0–46.0)
Hemoglobin: 12.3 g/dL (ref 12.0–15.0)
Immature Granulocytes: 0 %
Lymphocytes Relative: 26 %
Lymphs Abs: 1.2 10*3/uL (ref 0.7–4.0)
MCH: 30.5 pg (ref 26.0–34.0)
MCHC: 32.5 g/dL (ref 30.0–36.0)
MCV: 94 fL (ref 80.0–100.0)
Monocytes Absolute: 0.3 10*3/uL (ref 0.1–1.0)
Monocytes Relative: 6 %
Neutro Abs: 3.1 10*3/uL (ref 1.7–7.7)
Neutrophils Relative %: 67 %
Platelet Count: 255 10*3/uL (ref 150–400)
RBC: 4.03 MIL/uL (ref 3.87–5.11)
RDW: 12.9 % (ref 11.5–15.5)
WBC Count: 4.7 10*3/uL (ref 4.0–10.5)
nRBC: 0 % (ref 0.0–0.2)

## 2018-11-12 MED ORDER — FAMOTIDINE IN NACL 20-0.9 MG/50ML-% IV SOLN
INTRAVENOUS | Status: AC
  Filled 2018-11-12: qty 50

## 2018-11-12 MED ORDER — HEPARIN SOD (PORK) LOCK FLUSH 100 UNIT/ML IV SOLN
500.0000 [IU] | Freq: Once | INTRAVENOUS | Status: AC | PRN
  Administered 2018-11-12: 500 [IU]
  Filled 2018-11-12: qty 5

## 2018-11-12 MED ORDER — DIPHENHYDRAMINE HCL 50 MG/ML IJ SOLN
25.0000 mg | Freq: Once | INTRAMUSCULAR | Status: AC
  Administered 2018-11-12: 25 mg via INTRAVENOUS

## 2018-11-12 MED ORDER — ACETAMINOPHEN 325 MG PO TABS
650.0000 mg | ORAL_TABLET | Freq: Once | ORAL | Status: AC
  Administered 2018-11-12: 650 mg via ORAL

## 2018-11-12 MED ORDER — SODIUM CHLORIDE 0.9 % IV SOLN
80.0000 mg/m2 | Freq: Once | INTRAVENOUS | Status: AC
  Administered 2018-11-12: 150 mg via INTRAVENOUS
  Filled 2018-11-12: qty 25

## 2018-11-12 MED ORDER — SODIUM CHLORIDE 0.9 % IV SOLN
Freq: Once | INTRAVENOUS | Status: AC
  Administered 2018-11-12: 10:00:00 via INTRAVENOUS
  Filled 2018-11-12: qty 250

## 2018-11-12 MED ORDER — DIPHENHYDRAMINE HCL 50 MG/ML IJ SOLN
INTRAMUSCULAR | Status: AC
  Filled 2018-11-12: qty 1

## 2018-11-12 MED ORDER — SODIUM CHLORIDE 0.9% FLUSH
10.0000 mL | Freq: Once | INTRAVENOUS | Status: AC
  Administered 2018-11-12: 10 mL
  Filled 2018-11-12: qty 10

## 2018-11-12 MED ORDER — DEXAMETHASONE SODIUM PHOSPHATE 10 MG/ML IJ SOLN
INTRAMUSCULAR | Status: AC
  Filled 2018-11-12: qty 1

## 2018-11-12 MED ORDER — SODIUM CHLORIDE 0.9% FLUSH
10.0000 mL | INTRAVENOUS | Status: DC | PRN
  Administered 2018-11-12: 10 mL
  Filled 2018-11-12: qty 10

## 2018-11-12 MED ORDER — DIPHENHYDRAMINE HCL 25 MG PO CAPS
ORAL_CAPSULE | ORAL | Status: AC
  Filled 2018-11-12: qty 1

## 2018-11-12 MED ORDER — TRASTUZUMAB CHEMO 150 MG IV SOLR
150.0000 mg | Freq: Once | INTRAVENOUS | Status: AC
  Administered 2018-11-12: 150 mg via INTRAVENOUS
  Filled 2018-11-12: qty 7.14

## 2018-11-12 MED ORDER — DEXAMETHASONE SODIUM PHOSPHATE 10 MG/ML IJ SOLN
10.0000 mg | Freq: Once | INTRAMUSCULAR | Status: AC
  Administered 2018-11-12: 10 mg via INTRAVENOUS

## 2018-11-12 MED ORDER — ACETAMINOPHEN 325 MG PO TABS
ORAL_TABLET | ORAL | Status: AC
  Filled 2018-11-12: qty 2

## 2018-11-12 MED ORDER — FAMOTIDINE IN NACL 20-0.9 MG/50ML-% IV SOLN
20.0000 mg | Freq: Once | INTRAVENOUS | Status: AC
  Administered 2018-11-12: 20 mg via INTRAVENOUS

## 2018-11-12 NOTE — Patient Instructions (Signed)
[facility] Discharge Instructions for Patients Receiving Chemotherapy  Today you received the following chemotherapy agent:  Taxol & immunotherapy:  Herceptin  To help prevent nausea and vomiting after your treatment, we encourage you to take your nausea medication as needed.    If you develop nausea and vomiting that is not controlled by your nausea medication, call the clinic.   BELOW ARE SYMPTOMS THAT SHOULD BE REPORTED IMMEDIATELY:  *FEVER GREATER THAN 100.5 F  *CHILLS WITH OR WITHOUT FEVER  NAUSEA AND VOMITING THAT IS NOT CONTROLLED WITH YOUR NAUSEA MEDICATION  *UNUSUAL SHORTNESS OF BREATH  *UNUSUAL BRUISING OR BLEEDING  TENDERNESS IN MOUTH AND THROAT WITH OR WITHOUT PRESENCE OF ULCERS  *URINARY PROBLEMS  *BOWEL PROBLEMS  UNUSUAL RASH Items with * indicate a potential emergency and should be followed up as soon as possible.  Feel free to call the clinic should you have any questions or concerns. The clinic phone number is [phone].  Please show the [facility] [facility] at check-in to the Emergency Department and triage nurse.

## 2018-11-12 NOTE — Telephone Encounter (Signed)
Spoke to pt to assess needs during chemo. Denies needs or questions at this time. Encourage pt to call with concerns. Received verbal understanding.

## 2018-11-12 NOTE — Progress Notes (Signed)
Pt tol. Cryotherapy to head via digna-cap through herceptin & Taxol-pre-during-& post & ice to fingers & toes throughout taxol.

## 2018-11-16 ENCOUNTER — Encounter: Payer: Self-pay | Admitting: Genetic Counselor

## 2018-11-16 ENCOUNTER — Telehealth: Payer: Self-pay | Admitting: Genetic Counselor

## 2018-11-16 DIAGNOSIS — Z1379 Encounter for other screening for genetic and chromosomal anomalies: Secondary | ICD-10-CM | POA: Insufficient documentation

## 2018-11-16 NOTE — Telephone Encounter (Signed)
LM on VM on mobile number, and with spouse at home, that results are back and to please call.

## 2018-11-17 ENCOUNTER — Ambulatory Visit (HOSPITAL_BASED_OUTPATIENT_CLINIC_OR_DEPARTMENT_OTHER): Payer: BC Managed Care – PPO | Admitting: Genetic Counselor

## 2018-11-17 DIAGNOSIS — Z1379 Encounter for other screening for genetic and chromosomal anomalies: Secondary | ICD-10-CM

## 2018-11-17 NOTE — Telephone Encounter (Signed)
Spoke with patient and let her know that there was a pathogenic variant identified in PMS2.  This diagnoses her with Lynch syndrome. We will discuss this further today at 2 PM on a webex appointment.

## 2018-11-17 NOTE — Progress Notes (Addendum)
REFERRING PROVIDER: Milus Height, PA-C [address] [place],  [place] [address]  PRIMARY PROVIDER:  Milus Height, PA-C  PRIMARY REASON FOR VISIT:  1. Genetic testing     GENETIC TEST RESULTS   Patient Name: [name] Patient Age: 38 y.o. Encounter Date: 11/17/2018  I connected with [name] on 11/17/2018 at 2 PM EDT by Webex video conference and verified that I am speaking with the correct person using two identifiers.   Patient location: Home Provider location: Work  HPI: [name] was previously seen in the  Cancer Genetics clinic due to a family of cancer and concerns regarding a hereditary predisposition to cancer. Please refer to our prior cancer genetics clinic note for more information regarding [name] medical, social and family histories, and our assessment and recommendations, at the time. [name] recent genetic test results were disclosed to her, as were recommendations warranted by these results. These results and recommendations are discussed in more detail below.   FAMILY HISTORY:  We obtained a detailed, 4-generation family history.  Significant diagnoses are listed below: Family History  Problem Relation Age of Onset   Breast cancer Paternal Grandmother    Breast cancer Other    Breast cancer Other    Hashimoto's thyroiditis Son    Ulcerative colitis Niece     The patient has two sons who are cancer free. She has a maternal half sister, and paternal half brother who are cancer free.  Both parents are living.   The patient's mother is living at 26.  She has two brothers who are cancer free.  The maternal grandparents are both deceased from non cancer related issues.   The patient's father is living at 92.  He had a brother and sister who are cancer free.  His parents are both deceased.  The paternal grandmother had two sisters who each had breast cancer, one at age 75 and the other at 67.  The paternal great grandmother also  had breast cancer in her 90's.   Ms. Croghan is unaware of previous family history of genetic testing for hereditary cancer risks. Patient's maternal ancestors are of [place] descent, and paternal ancestors are of Native American descent. There is no reported Ashkenazi Jewish ancestry. There is no known consanguinity.      GENETIC TESTING:  At the time of [name]'s visit, we recommended she pursue genetic testing of the common hereditary cancer panel. The genetic testing was reported out on November 12, 2018 through the Common Cancer Panel offered by [facility] identified a single, heterozygous pathogenic gene mutation called PMS2, Deletion (Exons 5-9) confirming the diagnosis of Lynch syndrome.There were no deleterious mutations in APC, ATM, AXIN2, BARD1, BMPR1A, BRCA1, BRCA2, BRIP1, CDH1, CDK4, CDKN2A (p14ARF), CDKN2A (p16INK4a), CHEK2, CTNNA1, DICER1, EPCAM (Deletion/duplication testing only), GREM1 (promoter region deletion/duplication testing only), KIT, MEN1, MLH1, MSH2, MSH3, MSH6, MUTYH, NBN, NF1, NHTL1, PALB2, PDGFRA, POLD1, POLE, PTEN, RAD50, RAD51C, RAD51D, RNF43, SDHB, SDHC, SDHD, SMAD4, SMARCA4. STK11, TP53, TSC1, TSC2, and VHL.  The following genes were evaluated for sequence changes only: SDHA and HOXB13 c.251G>A variant only. .  Genetic testing did identify two Variants of uncertain significance (VUS) - one in the ATM gene called c.133C>T, and a second in the POLE gene called c.1280C>T.  At this time, it is unknown if these variants are associated with increased cancer risk or if they are normal findings, but most variants such as these get reclassified to being inconsequential. They should not  be used to make medical management decisions. With time, we suspect the lab will determine the significance of these variants, if any. If we do learn more about them, we will try to contact [name] to discuss it further. However, it is important to stay in touch with [place] periodically and keep the address and  phone number up to date.  UPDATE:ATM c.133C>T  VUS has been reclassified as Benign.  The amended report is November 10, 2022.  A copy of the test report has been scanned into Epic for review.   SCREENING RECOMMENDATIONS: We discussed the implications of Lynch syndrome for [name], and discussed who else in the family should have genetic testing. We recommended [name] follow management guidelines for Lynch syndrome; all of which are outlined below. These can be coordinated by [name]'s GI doctor or her primary provider.   1. Annual colonoscopy.   2. While there is no clear evidence to support screening for stomach and small bowel cancer, an upper endoscopy can be considered at 3-5 year intervals beginning at age 31-35. However, whether to have this screening is best determined by the gastroenterologist.   3. Annual urinalysis.   4. For patients with colorectal adenocarinoma, segmental or extended colectomy may be considered.  For women with Lynch syndrome, unlike the effective surveillance plan for colorectal cancer risk, there is no professional agreement regarding management for the increased risk of uterine and ovarian cancer. For endometrial cancer, women are encouraged to be aware of and immediately report dysfunctional or post-menopausal bleeding, which should then be followed up by an endometrial biopsy. In terms of surveillance, transvaginal ultrasound and endometrial biopsies have not been shown to be effective screening tools; however, they may be considered at the clinician's discretion. Importantly, transvaginal ultrasound is not recommended in pre-menopausal women due to variable presentations throughout a normal menstrual cycle. Endometrial biopsy can be considered every 1-2 years, but does not have proven benefit of reducing mortality in women with Lynch syndrome given the typically early presenting symptoms. Finally, while hysterectomy has not been shown to reduce endometrial cancer  mortality, it does reduce incidence, and therefore, can be considered as a risk-reducing option.  Unfortunately, symptoms of early stage ovarian cancer are not as obvious as endometrial cancer; however, women are encouraged to be aware of symptoms, such as pelvic or abdominal pain, bloating, increased abdominal girth, difficulty eating, feeling full from eating quickly, as well as increased urinary frequency and urgency. In terms of surveillance, transvaginal ultrasound examination and serum CA-125 have not been shown to be sufficiently sensitive or specific to support for routine screening. In terms of risk-reducing surgery, historically, women have been recommended to undergo a bilateral salpingo-oophorectomy (BSO) regardless of gene mutation; however, with the collection of gene-specific data, this recommendation has evolved. The most recent NCCN guidelines (v2.2019) notes the limited data to make specific recommendations for BSO in MSH6 and PMS2 mutation carriers. It is important to note that these guidelines do not cite the most recent article published with gene-specific risks (Dominguez-Valentin et al., 2019), which does quote a 3% ovarian cancer risk by age 70. Therefore, the decision to undergo a BSO, especially with this low-penetrance gene, should be individualized based on personal and family history.  However, we are available to help women and their providers establish an individualized surveillance plan. It is also important for women to understand the following:   1. Women should seek medical attention if they experience abnormal vaginal bleeding.  2. Some providers  may still recommend vaginal ultrasounds, uterine biopsies (for uterine cancer risk) and/or CA-125 analysis ( for ovarian cancer risk), even though these have not been shown to be effective.  3. A hysterectomy with removal of the ovaries and fallopian tubes could be considered once childbearing is completed (if planned).  FAMILY  MEMBERS: Since we now know the mutation in Ms. Riccardi, we can test at-risk relatives to determine whether or not they have inherited the mutation and are at increased risk for cancer.  It is important that all of Ms. Tarazon's relatives (both men and women) know of the presence of this gene mutation. Site-specific genetic testing can sort out who in the family is at risk and who is not. We will be happy to meet with any of the family members or refer them to a genetic counselor in their local area. To locate genetic counselors in other cities, individuals can visit the website of the [employer] ([email or web address]) and Financial controller for a Veterinary surgeon by zip code.   Ms. Shockey children are have a 50% chance to have inherited this mutation. However, they are relatively young and this will not be of any consequence to them for several years. We do not test children because there is no risk to them until they are adults. We recommend they have genetic counseling and testing by the time they are in their early 20s.    Ms. Kolander has a maternal half sister and paternal half brother.  Depending on which parent Ms. Barella inherited the PMS2 mutation from, will depend her siblings risk.  That sibling would have a 50% chance to have inherited this mutation. We recommend they have genetic testing for this same mutation, as identifying the presence of this mutation would allow them to also take advantage of risk-reducing measures.   Children who inherit two mutations in the same Lynch gene, one mutation from each parent, are at-risk of a rare recessive condition called constitutional mismatch repair deficiency (CMMR-D) syndrome. If family members have this mutation, they may wish to have their partner tested if they are planning on having children.  PLAN: Ms. Guadagnoli will need to be followed as high risk based on her diagnosis of Lynch syndrome.    Ms. Bonkoski does not have a gastroenterologist to perform follow up  for the diagnosis of Lynch syndrome.  We have asked that she be seen at [facility].  This note will be copied to that practice in order to set up the appropriate follow up.  Ms. Burchell does not have a gynecologist to perform follow up for the diagnosis of Lynch syndrome.  She has indicated that she will set up an appointment with the practice where she had her children and then notify us who that is.  This note will be copied to Ms. Nair's referring physician.  RESOURCES:  Ms. Desarro was given patient information about Lynch syndrome that was written by the national support group "it takes guts".    We strongly encouraged Ms. Halvorsen to remain in contact with [place] in cancer genetics on an annual basis so we can update Ms. Goeden's personal and family histories, and inform her of advances in cancer genetics that may be of benefit for the entire family. Ms. Garretson knows she is also welcome to call with any questions or concerns, at any time.   Shelia Magallon P. Lowell Guitar, MS, [facility]  Certified Genetic Counselor  [phone]  The patient was seen for a total of 30 minutes  in face-to-face genetic counseling.

## 2018-11-18 ENCOUNTER — Other Ambulatory Visit: Payer: BC Managed Care – PPO

## 2018-11-18 ENCOUNTER — Inpatient Hospital Stay: Payer: BC Managed Care – PPO

## 2018-11-18 ENCOUNTER — Other Ambulatory Visit: Payer: Self-pay | Admitting: *Deleted

## 2018-11-18 ENCOUNTER — Encounter: Payer: Self-pay | Admitting: Gastroenterology

## 2018-11-18 ENCOUNTER — Ambulatory Visit: Payer: BC Managed Care – PPO

## 2018-11-18 DIAGNOSIS — R05 Cough: Secondary | ICD-10-CM

## 2018-11-18 DIAGNOSIS — R059 Cough, unspecified: Secondary | ICD-10-CM

## 2018-11-18 MED ORDER — AZITHROMYCIN 1 G PO PACK: 1.0000 g | PACK | Freq: Once | ORAL | 0 refills | Status: AC

## 2018-11-18 MED ORDER — AZITHROMYCIN 250 MG PO TABS: ORAL_TABLET | ORAL | 0 refills | Status: DC

## 2018-11-18 NOTE — Progress Notes (Signed)
Received call from pt stating she is experiencing a cough and nasal congestion.  Pt states that she does not have a fever and denies any recent sick contacts.  Per Dr. Lindi Adie, hold treatment today so pt can be covid tested.  Orders placed for covid testing at [facility] and apt made.  Also per Dr. Lindi Adie, pt to receive z-pack.  Script sent to pharmacy.  Instructed pt to call office if there are any changes in her symptoms.  Pt verbalized understanding.

## 2018-11-18 NOTE — Progress Notes (Signed)
z

## 2018-11-18 NOTE — Progress Notes (Signed)
lmom for patient to call back 

## 2018-11-19 ENCOUNTER — Telehealth: Payer: Self-pay | Admitting: Hematology and Oncology

## 2018-11-19 NOTE — Progress Notes (Signed)
I spoke to patient. Appointment has been to see Dr Ardis Hughs in August

## 2018-11-19 NOTE — Telephone Encounter (Signed)
Scheduled appt per 7/16 sch message - pt to get an updated schedule next visit.

## 2018-11-19 NOTE — Assessment & Plan Note (Signed)
10/11/2018:Patient palpated a right breast mass, mammogram revealed 1.6 cm mass, biopsy revealed grade 3 invasive ductal carcinoma ER 0%, PR 0%, Ki-67 20%, HER-2 3+ positive, T1CN0 stage IA  Treatment plan: 1. Neoadjuvant Taxol and Herceptin weekly x12. Followed by Herceptin maintenance therapy every 3 weeks to complete 1 year. 2.Breast conserving surgery with sentinel lymph node biopsy 3.Adjuvant radiation therapy Upbeat clinical trial: No adverse effects from participating in the trial ---------------------------------------------------------------------------------------------------------------------------------------- Current treatment: Cycle 3 day 1 Taxol Herceptin Echocardiogram: 10/22/2018: EF 55 to 60%  Chemo toxicities: Mild nausea She is using cold cap as well as icing her hands and feet. Constipation  URI: Covid tested. Z-Pak  Return to clinic weekly for chemo and every other week for follow-up with me.

## 2018-11-22 LAB — NOVEL CORONAVIRUS, NAA: SARS-CoV-2, NAA: NOT DETECTED

## 2018-11-24 NOTE — Progress Notes (Signed)
Patient Care Team: Red[place], Barth Kirks, PA-C as PCP - General (Nurse Practitioner)  DIAGNOSIS:    ICD-10-CM   1. Malignant neoplasm of upper-outer quadrant of right breast in female, estrogen receptor negative ([place])  C50.411 LORazepam (ATIVAN) 0.5 MG tablet   Z17.1     SUMMARY OF ONCOLOGIC HISTORY: Oncology History  Malignant neoplasm of upper-outer quadrant of right breast in female, estrogen receptor negative ([place])  10/11/2018 Initial Diagnosis   Patient palpated a right breast mass, mammogram revealed 1.6 cm mass, biopsy revealed grade 3 invasive ductal carcinoma ER 0%, PR 0%, Ki-67 20%, HER-2 3+ positive, T1CN0 stage IA   10/19/2018 Cancer Staging   Staging form: Breast, AJCC 8th Edition - Clinical stage from 10/19/2018: Stage IA (cT1c, cN0, cM0, G3, ER-, PR-, HER2+) - Signed by [name], MD on 10/19/2018   10/29/2018 -  Chemotherapy   The patient had trastuzumab (HERCEPTIN) 300 mg in sodium chloride 0.9 % 250 mL chemo infusion, 294 mg, Intravenous,  Once, 1 of 3 cycles Dose modification: 6 mg/kg (original dose 2 mg/kg, Cycle 3, Reason: Other (see comments), Comment: in prep for maintenance Herceptin q3 weeks) Administration: 300 mg (10/29/2018), 150 mg (11/03/2018), 150 mg (11/12/2018) PACLitaxel (TAXOL) 150 mg in sodium chloride 0.9 % 250 mL chemo infusion (</= 64m/m2), 80 mg/m2 = 150 mg, Intravenous,  Once, 1 of 3 cycles Administration: 150 mg (10/29/2018), 150 mg (11/03/2018), 150 mg (11/12/2018)  for chemotherapy treatment.    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  The Common Hereditary Gene Panel offered by [facility] includes sequencing and/or deletion duplication testing of the following 48 genes: APC, ATM, AXIN2, BARD1, BMPR1A, BRCA1, BRCA2, BRIP1, CDH1, CDK4, CDKN2A (p14ARF), CDKN2A (p16INK4a), CHEK2, CTNNA1, DICER1, EPCAM (Deletion/duplication testing only), GREM1 (promoter region deletion/duplication  testing only), KIT, MEN1, MLH1, MSH2, MSH3, MSH6, MUTYH, NBN, NF1, NHTL1, PALB2, PDGFRA, PMS2, POLD1, POLE, PTEN, RAD50, RAD51C, RAD51D, RNF43, SDHB, SDHC, SDHD, SMAD4, SMARCA4. STK11, TP53, TSC1, TSC2, and VHL.  The following genes were evaluated for sequence changes only: SDHA and HOXB13 c.251G>A variant only.   Two VUS's also found - one in ATM c.133C>T and the other in POLE c.1280C>T.  These will not affect medical management. The report date is November 12, 2018.     CHIEF COMPLIANT: Cycle 4 Taxol Herceptin  INTERVAL HISTORY: Teresa WISLERis a 38y.o. with above-mentioned history of right breast cancer currently on neoadjuvant chemotherapy with weekly Taxol and Herceptin. COVID-19 testing on 11/18/18 was negative. She presents to the clinic today for cycle 4.  She had upper respiratory infection last week and chemo was held.  She is feeling much better and we will resume her chemotherapy today.  REVIEW OF SYSTEMS:   Constitutional: Denies fevers, chills or abnormal weight loss Eyes: Denies blurriness of vision Ears, nose, mouth, throat, and face: Denies mucositis or sore throat Respiratory: Denies cough, dyspnea or wheezes Cardiovascular: Denies palpitation, chest discomfort Gastrointestinal: Denies nausea, heartburn or change in bowel habits Skin: Denies abnormal skin rashes Lymphatics: Denies new lymphadenopathy or easy bruising Neurological: Denies numbness, tingling or new weaknesses Behavioral/Psych: Mood is stable, no new changes  Extremities: No lower extremity edema Breast: denies any pain or lumps or nodules in either breasts All other systems were reviewed with the patient and are negative.  I have reviewed the past medical history, past surgical history, social history and family history with the patient and they are unchanged from previous  note.  ALLERGIES:  has No Known Allergies.  MEDICATIONS:  Current Outpatient Medications  Medication Sig Dispense Refill  .  lidocaine-prilocaine (EMLA) cream Apply to affected area once 30 g 3  . LORazepam (ATIVAN) 0.5 MG tablet Take 1 tablet (0.5 mg total) by mouth at bedtime as needed for sleep. 30 tablet 0  . ondansetron (ZOFRAN) 8 MG tablet Take 1 tablet (8 mg total) by mouth 2 (two) times daily as needed (Nausea or vomiting). 30 tablet 1  . prochlorperazine (COMPAZINE) 10 MG tablet Take 1 tablet (10 mg total) by mouth every 6 (six) hours as needed (Nausea or vomiting). 30 tablet 1   No current facility-administered medications for this visit.     PHYSICAL EXAMINATION: ECOG PERFORMANCE STATUS: 1 - Symptomatic but completely ambulatory  Vitals:   11/25/18 1141  BP: 138/83  Pulse: 72  Resp: 20  Temp: 97.7 F (36.5 C)  SpO2: 100%   Filed Weights   11/25/18 1141  Weight: 170 lb 9.6 oz (77.4 kg)    GENERAL: alert, no distress and comfortable SKIN: skin color, texture, turgor are normal, no rashes or significant lesions EYES: normal, Conjunctiva are pink and non-injected, sclera clear OROPHARYNX: no exudate, no erythema and lips, buccal mucosa, and tongue normal  NECK: supple, thyroid normal size, non-tender, without nodularity LYMPH: no palpable lymphadenopathy in the cervical, axillary or inguinal LUNGS: clear to auscultation and percussion with normal breathing effort HEART: regular rate & rhythm and no murmurs and no lower extremity edema ABDOMEN: abdomen soft, non-tender and normal bowel sounds MUSCULOSKELETAL: no cyanosis of digits and no clubbing  NEURO: alert & oriented x 3 with fluent speech, no focal motor/sensory deficits EXTREMITIES: No lower extremity edema  LABORATORY DATA:  I have reviewed the data as listed CMP Latest Ref Rng & Units 11/12/2018 11/03/2018 10/29/2018  Glucose 70 - 99 mg/dL 95 86 100(H)  BUN 6 - 20 mg/dL '18 13 8  ' Creatinine 0.44 - 1.00 mg/dL 0.78 0.89 0.77  Sodium 135 - 145 mmol/L 140 140 141  Potassium 3.5 - 5.1 mmol/L 4.3 4.3 4.0  Chloride 98 - 111 mmol/L 107 107  108  CO2 22 - 32 mmol/L '26 24 23  ' Calcium 8.9 - 10.3 mg/dL 9.0 8.2(L) 8.4(L)  Total Protein 6.5 - 8.1 g/dL 7.0 6.9 6.7  Total Bilirubin 0.3 - 1.2 mg/dL 0.4 0.5 0.2(L)  Alkaline Phos 38 - 126 U/L 64 63 66  AST 15 - 41 U/L 11(L) 12(L) 13(L)  ALT 0 - 44 U/L '12 17 17    ' Lab Results  Component Value Date   WBC 7.0 11/25/2018   HGB 11.5 (L) 11/25/2018   HCT 34.8 (L) 11/25/2018   MCV 92.6 11/25/2018   PLT 244 11/25/2018   NEUTROABS 5.0 11/25/2018    ASSESSMENT & PLAN:  Malignant neoplasm of upper-outer quadrant of right breast in female, estrogen receptor negative ([place]) 10/11/2018:Patient palpated a right breast mass, mammogram revealed 1.6 cm mass, biopsy revealed grade 3 invasive ductal carcinoma ER 0%, PR 0%, Ki-67 20%, HER-2 3+ positive, T1CN0 stage IA  Treatment plan: 1. Neoadjuvant Taxol and Herceptin weekly x12. Followed by Herceptin maintenance therapy every 3 weeks to complete 1 year. 2.Breast conserving surgery with sentinel lymph node biopsy 3.Adjuvant radiation therapy Upbeat clinical trial: No adverse effects from participating in the trial ---------------------------------------------------------------------------------------------------------------------------------------- Current treatment: Cycle 4 day 1 Taxol Herceptin Echocardiogram: 10/22/2018: EF 55 to 60%  Chemo toxicities: Mild nausea She is using cold cap as well as icing  her hands and feet. Constipation  URI: Covid tested. Z-Pak We had to hold chemo last week because of upper respiratory tract infection.  It is much better and therefore we can resume her treatment today.  Return to clinic weekly for chemo and every other week for follow-up with me.    No orders of the defined types were placed in this encounter.  The patient has a good understanding of the overall plan. she agrees with it. she will call with any problems that may develop before the next visit here.  [name], MD 11/25/2018   [name] am acting as scribe for Dr. [name].  I have reviewed the above documentation for accuracy and completeness, and I agree with the above.

## 2018-11-25 ENCOUNTER — Other Ambulatory Visit: Payer: Self-pay

## 2018-11-25 ENCOUNTER — Inpatient Hospital Stay: Payer: BC Managed Care – PPO

## 2018-11-25 ENCOUNTER — Inpatient Hospital Stay (HOSPITAL_BASED_OUTPATIENT_CLINIC_OR_DEPARTMENT_OTHER): Payer: BC Managed Care – PPO | Admitting: Hematology and Oncology

## 2018-11-25 ENCOUNTER — Ambulatory Visit: Payer: BC Managed Care – PPO

## 2018-11-25 DIAGNOSIS — Z5111 Encounter for antineoplastic chemotherapy: Secondary | ICD-10-CM | POA: Diagnosis not present

## 2018-11-25 DIAGNOSIS — Z79899 Other long term (current) drug therapy: Secondary | ICD-10-CM | POA: Diagnosis not present

## 2018-11-25 DIAGNOSIS — Z171 Estrogen receptor negative status [ER-]: Secondary | ICD-10-CM

## 2018-11-25 DIAGNOSIS — C50411 Malignant neoplasm of upper-outer quadrant of right female breast: Secondary | ICD-10-CM

## 2018-11-25 DIAGNOSIS — L659 Nonscarring hair loss, unspecified: Secondary | ICD-10-CM | POA: Diagnosis not present

## 2018-11-25 DIAGNOSIS — Z95828 Presence of other vascular implants and grafts: Secondary | ICD-10-CM

## 2018-11-25 DIAGNOSIS — C969 Malignant neoplasm of lymphoid, hematopoietic and related tissue, unspecified: Secondary | ICD-10-CM | POA: Diagnosis not present

## 2018-11-25 LAB — CMP (CANCER CENTER ONLY)
ALT: 17 U/L (ref 0–44)
AST: 12 U/L — ABNORMAL LOW (ref 15–41)
Albumin: 3.5 g/dL (ref 3.5–5.0)
Alkaline Phosphatase: 69 U/L (ref 38–126)
Anion gap: 7 (ref 5–15)
BUN: 10 mg/dL (ref 6–20)
CO2: 26 mmol/L (ref 22–32)
Calcium: 8.8 mg/dL — ABNORMAL LOW (ref 8.9–10.3)
Chloride: 108 mmol/L (ref 98–111)
Creatinine: 0.71 mg/dL (ref 0.44–1.00)
GFR, Est AFR Am: >60 mL/min (ref >60)
GFR, Estimated: >60 mL/min (ref >60)
Glucose, Bld: 94 mg/dL (ref 70–99)
Potassium: 3.9 mmol/L (ref 3.5–5.1)
Sodium: 141 mmol/L (ref 135–145)
Total Bilirubin: 0.2 mg/dL — ABNORMAL LOW (ref 0.3–1.2)
Total Protein: 6.6 g/dL (ref 6.5–8.1)

## 2018-11-25 LAB — CBC WITH DIFFERENTIAL (CANCER CENTER ONLY)
Abs Immature Granulocytes: 0.03 10*3/uL (ref 0.00–0.07)
Basophils Absolute: 0 10*3/uL (ref 0.0–0.1)
Basophils Relative: 0 %
Eosinophils Absolute: 0.1 10*3/uL (ref 0.0–0.5)
Eosinophils Relative: 1 %
HCT: 34.8 % — ABNORMAL LOW (ref 36.0–46.0)
Hemoglobin: 11.5 g/dL — ABNORMAL LOW (ref 12.0–15.0)
Immature Granulocytes: 0 %
Lymphocytes Relative: 23 %
Lymphs Abs: 1.6 10*3/uL (ref 0.7–4.0)
MCH: 30.6 pg (ref 26.0–34.0)
MCHC: 33 g/dL (ref 30.0–36.0)
MCV: 92.6 fL (ref 80.0–100.0)
Monocytes Absolute: 0.3 10*3/uL (ref 0.1–1.0)
Monocytes Relative: 5 %
Neutro Abs: 5 10*3/uL (ref 1.7–7.7)
Neutrophils Relative %: 71 %
Platelet Count: 244 10*3/uL (ref 150–400)
RBC: 3.76 MIL/uL — ABNORMAL LOW (ref 3.87–5.11)
RDW: 13 % (ref 11.5–15.5)
WBC Count: 7 10*3/uL (ref 4.0–10.5)
nRBC: 0 % (ref 0.0–0.2)

## 2018-11-25 LAB — RESEARCH LABS

## 2018-11-25 MED ORDER — HEPARIN SOD (PORK) LOCK FLUSH 100 UNIT/ML IV SOLN
500.0000 [IU] | Freq: Once | INTRAVENOUS | Status: AC | PRN
  Administered 2018-11-25: 500 [IU]
  Filled 2018-11-25: qty 5

## 2018-11-25 MED ORDER — SODIUM CHLORIDE 0.9 % IV SOLN
Freq: Once | INTRAVENOUS | Status: AC
  Administered 2018-11-25: 13:00:00 via INTRAVENOUS
  Filled 2018-11-25: qty 250

## 2018-11-25 MED ORDER — DEXAMETHASONE SODIUM PHOSPHATE 10 MG/ML IJ SOLN
INTRAMUSCULAR | Status: AC
  Filled 2018-11-25: qty 1

## 2018-11-25 MED ORDER — FAMOTIDINE IN NACL 20-0.9 MG/50ML-% IV SOLN
INTRAVENOUS | Status: AC
  Filled 2018-11-25: qty 50

## 2018-11-25 MED ORDER — TRASTUZUMAB CHEMO 150 MG IV SOLR
150.0000 mg | Freq: Once | INTRAVENOUS | Status: AC
  Administered 2018-11-25: 150 mg via INTRAVENOUS
  Filled 2018-11-25: qty 7.14

## 2018-11-25 MED ORDER — SODIUM CHLORIDE 0.9 % IV SOLN
80.0000 mg/m2 | Freq: Once | INTRAVENOUS | Status: AC
  Administered 2018-11-25: 150 mg via INTRAVENOUS
  Filled 2018-11-25: qty 25

## 2018-11-25 MED ORDER — ACETAMINOPHEN 325 MG PO TABS
ORAL_TABLET | ORAL | Status: AC
  Filled 2018-11-25: qty 2

## 2018-11-25 MED ORDER — SODIUM CHLORIDE 0.9% FLUSH
10.0000 mL | Freq: Once | INTRAVENOUS | Status: AC
  Administered 2018-11-25: 10 mL
  Filled 2018-11-25: qty 10

## 2018-11-25 MED ORDER — LORAZEPAM 0.5 MG PO TABS: 0.5000 mg | ORAL_TABLET | Freq: Every evening | ORAL | 0 refills | Status: DC | PRN

## 2018-11-25 MED ORDER — FAMOTIDINE IN NACL 20-0.9 MG/50ML-% IV SOLN
20.0000 mg | Freq: Once | INTRAVENOUS | Status: AC
  Administered 2018-11-25: 20 mg via INTRAVENOUS

## 2018-11-25 MED ORDER — DIPHENHYDRAMINE HCL 50 MG/ML IJ SOLN
25.0000 mg | Freq: Once | INTRAMUSCULAR | Status: AC
  Administered 2018-11-25: 25 mg via INTRAVENOUS

## 2018-11-25 MED ORDER — SODIUM CHLORIDE 0.9% FLUSH
10.0000 mL | INTRAVENOUS | Status: DC | PRN
  Administered 2018-11-25: 10 mL
  Filled 2018-11-25: qty 10

## 2018-11-25 MED ORDER — DIPHENHYDRAMINE HCL 50 MG/ML IJ SOLN
INTRAMUSCULAR | Status: AC
  Filled 2018-11-25: qty 1

## 2018-11-25 MED ORDER — DEXAMETHASONE SODIUM PHOSPHATE 10 MG/ML IJ SOLN
10.0000 mg | Freq: Once | INTRAMUSCULAR | Status: AC
  Administered 2018-11-25: 10 mg via INTRAVENOUS

## 2018-11-25 MED ORDER — ACETAMINOPHEN 325 MG PO TABS
650.0000 mg | ORAL_TABLET | Freq: Once | ORAL | Status: AC
  Administered 2018-11-25: 650 mg via ORAL

## 2018-11-25 NOTE — Progress Notes (Signed)
Per Dr. Lindi Adie, okay to release and administer pre-meds while waiting on CMP to result.

## 2018-11-25 NOTE — Patient Instructions (Signed)

## 2018-11-25 NOTE — Patient Instructions (Signed)
[facility] Discharge Instructions for Patients Receiving Chemotherapy  Today you received the following chemotherapy agents:  Herceptin and Taxol.  To help prevent nausea and vomiting after your treatment, we encourage you to take your nausea medication as directed.   If you develop nausea and vomiting that is not controlled by your nausea medication, call the clinic.   BELOW ARE SYMPTOMS THAT SHOULD BE REPORTED IMMEDIATELY:  *FEVER GREATER THAN 100.5 F  *CHILLS WITH OR WITHOUT FEVER  NAUSEA AND VOMITING THAT IS NOT CONTROLLED WITH YOUR NAUSEA MEDICATION  *UNUSUAL SHORTNESS OF BREATH  *UNUSUAL BRUISING OR BLEEDING  TENDERNESS IN MOUTH AND THROAT WITH OR WITHOUT PRESENCE OF ULCERS  *URINARY PROBLEMS  *BOWEL PROBLEMS  UNUSUAL RASH Items with * indicate a potential emergency and should be followed up as soon as possible.  Feel free to call the clinic should you have any questions or concerns. The clinic phone number is [phone].  Please show the CHEMO ALERT CARD at check-in to the Emergency Department and triage nurse.   

## 2018-11-29 DIAGNOSIS — C50411 Malignant neoplasm of upper-outer quadrant of right female breast: Secondary | ICD-10-CM | POA: Diagnosis not present

## 2018-12-02 ENCOUNTER — Inpatient Hospital Stay: Payer: BC Managed Care – PPO

## 2018-12-02 ENCOUNTER — Other Ambulatory Visit: Payer: Self-pay

## 2018-12-02 ENCOUNTER — Ambulatory Visit: Payer: BC Managed Care – PPO

## 2018-12-02 VITALS — BP 133/87 | HR 77 | Temp 97.7°F | Resp 20

## 2018-12-02 DIAGNOSIS — Z171 Estrogen receptor negative status [ER-]: Secondary | ICD-10-CM

## 2018-12-02 DIAGNOSIS — C50411 Malignant neoplasm of upper-outer quadrant of right female breast: Secondary | ICD-10-CM

## 2018-12-02 DIAGNOSIS — Z95828 Presence of other vascular implants and grafts: Secondary | ICD-10-CM

## 2018-12-02 DIAGNOSIS — Z5111 Encounter for antineoplastic chemotherapy: Secondary | ICD-10-CM | POA: Diagnosis not present

## 2018-12-02 DIAGNOSIS — Z79899 Other long term (current) drug therapy: Secondary | ICD-10-CM | POA: Diagnosis not present

## 2018-12-02 DIAGNOSIS — L659 Nonscarring hair loss, unspecified: Secondary | ICD-10-CM | POA: Diagnosis not present

## 2018-12-02 DIAGNOSIS — C969 Malignant neoplasm of lymphoid, hematopoietic and related tissue, unspecified: Secondary | ICD-10-CM | POA: Diagnosis not present

## 2018-12-02 LAB — CBC WITH DIFFERENTIAL (CANCER CENTER ONLY)
Abs Immature Granulocytes: 0.05 10*3/uL (ref 0.00–0.07)
Basophils Absolute: 0 10*3/uL (ref 0.0–0.1)
Basophils Relative: 0 %
Eosinophils Absolute: 0.1 10*3/uL (ref 0.0–0.5)
Eosinophils Relative: 2 %
HCT: 35.4 % — ABNORMAL LOW (ref 36.0–46.0)
Hemoglobin: 11.8 g/dL — ABNORMAL LOW (ref 12.0–15.0)
Immature Granulocytes: 1 %
Lymphocytes Relative: 22 %
Lymphs Abs: 1.5 10*3/uL (ref 0.7–4.0)
MCH: 31 pg (ref 26.0–34.0)
MCHC: 33.3 g/dL (ref 30.0–36.0)
MCV: 92.9 fL (ref 80.0–100.0)
Monocytes Absolute: 0.3 10*3/uL (ref 0.1–1.0)
Monocytes Relative: 4 %
Neutro Abs: 4.8 10*3/uL (ref 1.7–7.7)
Neutrophils Relative %: 71 %
Platelet Count: 273 10*3/uL (ref 150–400)
RBC: 3.81 MIL/uL — ABNORMAL LOW (ref 3.87–5.11)
RDW: 13.3 % (ref 11.5–15.5)
WBC Count: 6.8 10*3/uL (ref 4.0–10.5)
nRBC: 0 % (ref 0.0–0.2)

## 2018-12-02 LAB — CMP (CANCER CENTER ONLY)
ALT: 19 U/L (ref 0–44)
AST: 14 U/L — ABNORMAL LOW (ref 15–41)
Albumin: 3.7 g/dL (ref 3.5–5.0)
Alkaline Phosphatase: 67 U/L (ref 38–126)
Anion gap: 6 (ref 5–15)
BUN: 15 mg/dL (ref 6–20)
CO2: 26 mmol/L (ref 22–32)
Calcium: 9.1 mg/dL (ref 8.9–10.3)
Chloride: 109 mmol/L (ref 98–111)
Creatinine: 0.73 mg/dL (ref 0.44–1.00)
GFR, Est AFR Am: >60 mL/min (ref >60)
GFR, Estimated: >60 mL/min (ref >60)
Glucose, Bld: 87 mg/dL (ref 70–99)
Potassium: 4.2 mmol/L (ref 3.5–5.1)
Sodium: 141 mmol/L (ref 135–145)
Total Bilirubin: 0.3 mg/dL (ref 0.3–1.2)
Total Protein: 6.7 g/dL (ref 6.5–8.1)

## 2018-12-02 MED ORDER — SODIUM CHLORIDE 0.9 % IV SOLN
Freq: Once | INTRAVENOUS | Status: AC
  Administered 2018-12-02: 13:00:00 via INTRAVENOUS
  Filled 2018-12-02: qty 250

## 2018-12-02 MED ORDER — DEXAMETHASONE SODIUM PHOSPHATE 10 MG/ML IJ SOLN
INTRAMUSCULAR | Status: AC
  Filled 2018-12-02: qty 1

## 2018-12-02 MED ORDER — ACETAMINOPHEN 325 MG PO TABS
ORAL_TABLET | ORAL | Status: AC
  Filled 2018-12-02: qty 2

## 2018-12-02 MED ORDER — HEPARIN SOD (PORK) LOCK FLUSH 100 UNIT/ML IV SOLN
500.0000 [IU] | Freq: Once | INTRAVENOUS | Status: DC | PRN
  Filled 2018-12-02: qty 5

## 2018-12-02 MED ORDER — DEXAMETHASONE SODIUM PHOSPHATE 10 MG/ML IJ SOLN
10.0000 mg | Freq: Once | INTRAMUSCULAR | Status: AC
  Administered 2018-12-02: 10 mg via INTRAVENOUS

## 2018-12-02 MED ORDER — DIPHENHYDRAMINE HCL 50 MG/ML IJ SOLN
INTRAMUSCULAR | Status: AC
  Filled 2018-12-02: qty 1

## 2018-12-02 MED ORDER — FAMOTIDINE IN NACL 20-0.9 MG/50ML-% IV SOLN
INTRAVENOUS | Status: AC
  Filled 2018-12-02: qty 50

## 2018-12-02 MED ORDER — SODIUM CHLORIDE 0.9% FLUSH
10.0000 mL | INTRAVENOUS | Status: DC | PRN
  Filled 2018-12-02: qty 10

## 2018-12-02 MED ORDER — SODIUM CHLORIDE 0.9% FLUSH
10.0000 mL | Freq: Once | INTRAVENOUS | Status: AC
  Administered 2018-12-02: 10 mL
  Filled 2018-12-02: qty 10

## 2018-12-02 MED ORDER — FAMOTIDINE IN NACL 20-0.9 MG/50ML-% IV SOLN
20.0000 mg | Freq: Once | INTRAVENOUS | Status: AC
  Administered 2018-12-02: 20 mg via INTRAVENOUS

## 2018-12-02 MED ORDER — SODIUM CHLORIDE 0.9 % IV SOLN
80.0000 mg/m2 | Freq: Once | INTRAVENOUS | Status: AC
  Administered 2018-12-02: 150 mg via INTRAVENOUS
  Filled 2018-12-02: qty 25

## 2018-12-02 MED ORDER — DIPHENHYDRAMINE HCL 50 MG/ML IJ SOLN
25.0000 mg | Freq: Once | INTRAMUSCULAR | Status: AC
  Administered 2018-12-02: 25 mg via INTRAVENOUS

## 2018-12-02 MED ORDER — TRASTUZUMAB CHEMO 150 MG IV SOLR
150.0000 mg | Freq: Once | INTRAVENOUS | Status: AC
  Administered 2018-12-02: 150 mg via INTRAVENOUS
  Filled 2018-12-02: qty 7.14

## 2018-12-02 MED ORDER — ACETAMINOPHEN 325 MG PO TABS
650.0000 mg | ORAL_TABLET | Freq: Once | ORAL | Status: AC
  Administered 2018-12-02: 650 mg via ORAL

## 2018-12-02 NOTE — Assessment & Plan Note (Signed)
10/11/2018:Patient palpated a right breast mass, mammogram revealed 1.6 cm mass, biopsy revealed grade 3 invasive ductal carcinoma ER 0%, PR 0%, Ki-67 20%, HER-2 3+ positive, T1CN0 stage IA  Treatment plan: 1. Neoadjuvant Taxol and Herceptin weekly x12. Followed by Herceptin maintenance therapy every 3 weeks to complete 1 year. 2.Breast conserving surgery with sentinel lymph node biopsy 3.Adjuvant radiation therapy Upbeat clinical trial: No adverse effects from participating in the trial ---------------------------------------------------------------------------------------------------------------------------------------- Current treatment: Cycle6day 1 Taxol Herceptin Echocardiogram: 10/22/2018: EF 55 to 60%  Chemo toxicities: Mild nausea She is using cold cap as well as icing her hands and feet. Constipation  URI: Covid tested Negative,  Resolved with Z-Pak

## 2018-12-02 NOTE — Patient Instructions (Signed)
[facility] Discharge Instructions for Patients Receiving Chemotherapy  Today you received the following chemotherapy agents:  Herceptin and Taxol.  To help prevent nausea and vomiting after your treatment, we encourage you to take your nausea medication as directed.   If you develop nausea and vomiting that is not controlled by your nausea medication, call the clinic.   BELOW ARE SYMPTOMS THAT SHOULD BE REPORTED IMMEDIATELY:  *FEVER GREATER THAN 100.5 F  *CHILLS WITH OR WITHOUT FEVER  NAUSEA AND VOMITING THAT IS NOT CONTROLLED WITH YOUR NAUSEA MEDICATION  *UNUSUAL SHORTNESS OF BREATH  *UNUSUAL BRUISING OR BLEEDING  TENDERNESS IN MOUTH AND THROAT WITH OR WITHOUT PRESENCE OF ULCERS  *URINARY PROBLEMS  *BOWEL PROBLEMS  UNUSUAL RASH Items with * indicate a potential emergency and should be followed up as soon as possible.  Feel free to call the clinic should you have any questions or concerns. The clinic phone number is [phone].  Please show the CHEMO ALERT CARD at check-in to the Emergency Department and triage nurse.   

## 2018-12-02 NOTE — Progress Notes (Signed)
Cryotherapy applied to hands & feet today with taxol infusion.

## 2018-12-07 NOTE — Progress Notes (Signed)
The following biosimilar Ogivri (trastuzumab-dkst) has been selected for use in this patient.  Kennith Center, Pharm.D., CPP 12/07/2018@4 :25 PM

## 2018-12-08 NOTE — Progress Notes (Signed)
Patient Care Team: Redmon, Barth Kirks, PA-C as PCP - General (Nurse Practitioner)  DIAGNOSIS:    ICD-10-CM   1. Malignant neoplasm of upper-outer quadrant of right breast in female, estrogen receptor negative ([place])  C50.411    Z17.1     SUMMARY OF ONCOLOGIC HISTORY: Oncology History  Malignant neoplasm of upper-outer quadrant of right breast in female, estrogen receptor negative ([place])  10/11/2018 Initial Diagnosis   Patient palpated a right breast mass, mammogram revealed 1.6 cm mass, biopsy revealed grade 3 invasive ductal carcinoma ER 0%, PR 0%, Ki-67 20%, HER-2 3+ positive, T1CN0 stage IA   10/19/2018 Cancer Staging   Staging form: Breast, AJCC 8th Edition - Clinical stage from 10/19/2018: Stage IA (cT1c, cN0, cM0, G3, ER-, PR-, HER2+) - Signed by [name], MD on 10/19/2018   10/29/2018 -  Chemotherapy   The patient had trastuzumab (HERCEPTIN) 300 mg in sodium chloride 0.9 % 250 mL chemo infusion, 294 mg, Intravenous,  Once, 2 of 2 cycles Administration: 300 mg (10/29/2018), 150 mg (11/03/2018), 150 mg (12/02/2018), 150 mg (11/12/2018), 150 mg (11/25/2018) PACLitaxel (TAXOL) 150 mg in sodium chloride 0.9 % 250 mL chemo infusion (</= 23m/m2), 80 mg/m2 = 150 mg, Intravenous,  Once, 2 of 3 cycles Administration: 150 mg (10/29/2018), 150 mg (11/03/2018), 150 mg (12/02/2018), 150 mg (11/12/2018), 150 mg (11/25/2018) trastuzumab-dkst (OGIVRI) 150 mg in sodium chloride 0.9 % 250 mL chemo infusion, 150 mg (100 % of original dose 150 mg), Intravenous,  Once, 1 of 2 cycles Dose modification: 150 mg (original dose 150 mg, Cycle 2, Reason: Other (see comments), Comment: Biosimilar Conversion), 450 mg (original dose 150 mg, Cycle 3, Reason: Other (see comments), Comment: Biosimilar Conversion)  for chemotherapy treatment.    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  The Common Hereditary Gene Panel offered by [facility] includes  sequencing and/or deletion duplication testing of the following 48 genes: APC, ATM, AXIN2, BARD1, BMPR1A, BRCA1, BRCA2, BRIP1, CDH1, CDK4, CDKN2A (p14ARF), CDKN2A (p16INK4a), CHEK2, CTNNA1, DICER1, EPCAM (Deletion/duplication testing only), GREM1 (promoter region deletion/duplication testing only), KIT, MEN1, MLH1, MSH2, MSH3, MSH6, MUTYH, NBN, NF1, NHTL1, PALB2, PDGFRA, PMS2, POLD1, POLE, PTEN, RAD50, RAD51C, RAD51D, RNF43, SDHB, SDHC, SDHD, SMAD4, SMARCA4. STK11, TP53, TSC1, TSC2, and VHL.  The following genes were evaluated for sequence changes only: SDHA and HOXB13 c.251G>A variant only.   Two VUS's also found - one in ATM c.133C>T and the other in POLE c.1280C>T.  These will not affect medical management. The report date is November 12, 2018.     CHIEF COMPLIANT: Cycle 6 Taxol Herceptin  INTERVAL HISTORY: Teresa MADLis a 38y.o. with above-mentioned history of right breast cancer currently on neoadjuvant chemotherapy with weekly Taxol and Herceptin.She is a participant in the UpBeat clinical trial. She presents to the clinic today for cycle 6.  She has had constipation which has been well controlled with Colace and intermittent MiraLAX.  On the day of treatment she just cannot sleep in spite of the sleeping medication.  She does not have neuropathy.  Does not have nausea or vomiting.  REVIEW OF SYSTEMS:   Constitutional: Denies fevers, chills or abnormal weight loss Eyes: Denies blurriness of vision Ears, nose, mouth, throat, and face: Denies mucositis or sore throat Respiratory: Denies cough, dyspnea or wheezes Cardiovascular: Denies palpitation, chest discomfort Gastrointestinal: Denies nausea, heartburn or change in bowel habits Skin: Denies abnormal skin rashes Lymphatics: Denies new lymphadenopathy or easy  bruising Neurological: Denies numbness, tingling or new weaknesses Behavioral/Psych: Mood is stable, no new changes  Extremities: No lower extremity edema Breast: denies any pain or  lumps or nodules in either breasts All other systems were reviewed with the patient and are negative.  I have reviewed the past medical history, past surgical history, social history and family history with the patient and they are unchanged from previous note.  ALLERGIES:  has No Known Allergies.  MEDICATIONS:  Current Outpatient Medications  Medication Sig Dispense Refill  . lidocaine-prilocaine (EMLA) cream Apply to affected area once 30 g 3  . LORazepam (ATIVAN) 0.5 MG tablet Take 1 tablet (0.5 mg total) by mouth at bedtime as needed for sleep. 30 tablet 0  . ondansetron (ZOFRAN) 8 MG tablet Take 1 tablet (8 mg total) by mouth 2 (two) times daily as needed (Nausea or vomiting). 30 tablet 1  . prochlorperazine (COMPAZINE) 10 MG tablet Take 1 tablet (10 mg total) by mouth every 6 (six) hours as needed (Nausea or vomiting). 30 tablet 1   No current facility-administered medications for this visit.     PHYSICAL EXAMINATION: ECOG PERFORMANCE STATUS: 1 - Symptomatic but completely ambulatory  There were no vitals filed for this visit. There were no vitals filed for this visit.  GENERAL: alert, no distress and comfortable SKIN: skin color, texture, turgor are normal, no rashes or significant lesions EYES: normal, Conjunctiva are pink and non-injected, sclera clear OROPHARYNX: no exudate, no erythema and lips, buccal mucosa, and tongue normal  NECK: supple, thyroid normal size, non-tender, without nodularity LYMPH: no palpable lymphadenopathy in the cervical, axillary or inguinal LUNGS: clear to auscultation and percussion with normal breathing effort HEART: regular rate & rhythm and no murmurs and no lower extremity edema ABDOMEN: abdomen soft, non-tender and normal bowel sounds MUSCULOSKELETAL: no cyanosis of digits and no clubbing  NEURO: alert & oriented x 3 with fluent speech, no focal motor/sensory deficits EXTREMITIES: No lower extremity edema  LABORATORY DATA:  I have  reviewed the data as listed CMP Latest Ref Rng & Units 12/02/2018 11/25/2018 11/12/2018  Glucose 70 - 99 mg/dL 87 94 95  BUN 6 - 20 mg/dL _0 Creatinine 0.44 - 1.00 mg/dL 0.73 0.71 0.78  Sodium 135 - 145 mmol/L 141 141 140  Potassium 3.5 - 5.1 mmol/L 4.2 3.9 4.3  Chloride 98 - 111 mmol/L 109 108 107  CO2 22 - 32 mmol/L _1 Calcium 8.9 - 10.3 mg/dL 9.1 8.8(L) 9.0  Total Protein 6.5 - 8.1 g/dL 6.7 6.6 7.0  Total Bilirubin 0.3 - 1.2 mg/dL 0.3 0.2(L) 0.4  Alkaline Phos 38 - 126 U/L 67 69 64  AST 15 - 41 U/L 14(L) 12(L) 11(L)  ALT 0 - 44 U/L _2 Lab Results  Component Value Date   WBC 6.8 12/02/2018   HGB 11.8 (L) 12/02/2018   HCT 35.4 (L) 12/02/2018   MCV 92.9 12/02/2018   PLT 273 12/02/2018   NEUTROABS 4.8 12/02/2018    ASSESSMENT & PLAN:  Malignant neoplasm of upper-outer quadrant of right breast in female, estrogen receptor negative ([place]) 10/11/2018:Patient palpated a right breast mass, mammogram revealed 1.6 cm mass, biopsy revealed grade 3 invasive ductal carcinoma ER 0%, PR 0%, Ki-67 20%, HER-2 3+ positive, T1CN0 stage IA  Treatment plan: 1. Neoadjuvant Taxol and Herceptin weekly x12. Followed by Herceptin maintenance therapy every 3 weeks to complete 1 year. 2.Breast conserving surgery with sentinel lymph node biopsy  3.Adjuvant radiation therapy Upbeat clinical trial: No adverse effects from participating in the trial ---------------------------------------------------------------------------------------------------------------------------------------- Current treatment: Cycle6day 1 Taxol Herceptin Echocardiogram: 10/22/2018: EF 55 to 60%  Chemo toxicities: Constipation She is using cold cap as well as icing her hands and feet. Monitoring closely for toxicities  Return to clinic weekly for chemo and every other week for follow-up with me No orders of the defined types were placed in this encounter.  The patient has a good understanding of the  overall plan. she agrees with it. she will call with any problems that may develop before the next visit here.  [name], MD 12/09/2018  Julious Oka Dorshimer am acting as scribe for Dr. [name].  I have reviewed the above documentation for accuracy and completeness, and I agree with the above.

## 2018-12-09 ENCOUNTER — Inpatient Hospital Stay: Payer: BC Managed Care – PPO

## 2018-12-09 ENCOUNTER — Other Ambulatory Visit: Payer: Self-pay

## 2018-12-09 ENCOUNTER — Encounter: Payer: Self-pay | Admitting: *Deleted

## 2018-12-09 ENCOUNTER — Inpatient Hospital Stay (HOSPITAL_BASED_OUTPATIENT_CLINIC_OR_DEPARTMENT_OTHER): Payer: BC Managed Care – PPO | Admitting: Hematology and Oncology

## 2018-12-09 ENCOUNTER — Inpatient Hospital Stay: Payer: BC Managed Care – PPO | Attending: Hematology and Oncology

## 2018-12-09 DIAGNOSIS — Z5111 Encounter for antineoplastic chemotherapy: Secondary | ICD-10-CM | POA: Diagnosis not present

## 2018-12-09 DIAGNOSIS — Z171 Estrogen receptor negative status [ER-]: Secondary | ICD-10-CM | POA: Diagnosis not present

## 2018-12-09 DIAGNOSIS — C50411 Malignant neoplasm of upper-outer quadrant of right female breast: Secondary | ICD-10-CM

## 2018-12-09 DIAGNOSIS — Z5112 Encounter for antineoplastic immunotherapy: Secondary | ICD-10-CM | POA: Insufficient documentation

## 2018-12-09 DIAGNOSIS — K5900 Constipation, unspecified: Secondary | ICD-10-CM | POA: Insufficient documentation

## 2018-12-09 DIAGNOSIS — Z79899 Other long term (current) drug therapy: Secondary | ICD-10-CM | POA: Diagnosis not present

## 2018-12-09 DIAGNOSIS — Z95828 Presence of other vascular implants and grafts: Secondary | ICD-10-CM

## 2018-12-09 DIAGNOSIS — C969 Malignant neoplasm of lymphoid, hematopoietic and related tissue, unspecified: Secondary | ICD-10-CM | POA: Diagnosis not present

## 2018-12-09 DIAGNOSIS — L659 Nonscarring hair loss, unspecified: Secondary | ICD-10-CM | POA: Diagnosis not present

## 2018-12-09 LAB — CBC WITH DIFFERENTIAL (CANCER CENTER ONLY)
Abs Immature Granulocytes: 0.02 10*3/uL (ref 0.00–0.07)
Basophils Absolute: 0 10*3/uL (ref 0.0–0.1)
Basophils Relative: 0 %
Eosinophils Absolute: 0.1 10*3/uL (ref 0.0–0.5)
Eosinophils Relative: 2 %
HCT: 35.1 % — ABNORMAL LOW (ref 36.0–46.0)
Hemoglobin: 11.6 g/dL — ABNORMAL LOW (ref 12.0–15.0)
Immature Granulocytes: 0 %
Lymphocytes Relative: 29 %
Lymphs Abs: 1.5 10*3/uL (ref 0.7–4.0)
MCH: 31 pg (ref 26.0–34.0)
MCHC: 33 g/dL (ref 30.0–36.0)
MCV: 93.9 fL (ref 80.0–100.0)
Monocytes Absolute: 0.2 10*3/uL (ref 0.1–1.0)
Monocytes Relative: 5 %
Neutro Abs: 3.2 10*3/uL (ref 1.7–7.7)
Neutrophils Relative %: 64 %
Platelet Count: 221 10*3/uL (ref 150–400)
RBC: 3.74 MIL/uL — ABNORMAL LOW (ref 3.87–5.11)
RDW: 13.8 % (ref 11.5–15.5)
WBC Count: 5 10*3/uL (ref 4.0–10.5)
nRBC: 0 % (ref 0.0–0.2)

## 2018-12-09 LAB — CMP (CANCER CENTER ONLY)
ALT: 13 U/L (ref 0–44)
AST: 10 U/L — ABNORMAL LOW (ref 15–41)
Albumin: 3.8 g/dL (ref 3.5–5.0)
Alkaline Phosphatase: 65 U/L (ref 38–126)
Anion gap: 9 (ref 5–15)
BUN: 13 mg/dL (ref 6–20)
CO2: 24 mmol/L (ref 22–32)
Calcium: 9.1 mg/dL (ref 8.9–10.3)
Chloride: 107 mmol/L (ref 98–111)
Creatinine: 0.73 mg/dL (ref 0.44–1.00)
GFR, Est AFR Am: >60 mL/min (ref >60)
GFR, Estimated: >60 mL/min (ref >60)
Glucose, Bld: 95 mg/dL (ref 70–99)
Potassium: 3.9 mmol/L (ref 3.5–5.1)
Sodium: 140 mmol/L (ref 135–145)
Total Bilirubin: 0.3 mg/dL (ref 0.3–1.2)
Total Protein: 6.6 g/dL (ref 6.5–8.1)

## 2018-12-09 MED ORDER — FAMOTIDINE IN NACL 20-0.9 MG/50ML-% IV SOLN
INTRAVENOUS | Status: AC
  Filled 2018-12-09: qty 50

## 2018-12-09 MED ORDER — TRASTUZUMAB CHEMO 150 MG IV SOLR
150.0000 mg | Freq: Once | INTRAVENOUS | Status: AC
  Administered 2018-12-09: 150 mg via INTRAVENOUS
  Filled 2018-12-09: qty 7.14

## 2018-12-09 MED ORDER — SODIUM CHLORIDE 0.9% FLUSH
10.0000 mL | Freq: Once | INTRAVENOUS | Status: AC
  Administered 2018-12-09: 10 mL
  Filled 2018-12-09: qty 10

## 2018-12-09 MED ORDER — SODIUM CHLORIDE 0.9 % IV SOLN
Freq: Once | INTRAVENOUS | Status: AC
  Administered 2018-12-09: 09:00:00 via INTRAVENOUS
  Filled 2018-12-09: qty 250

## 2018-12-09 MED ORDER — SODIUM CHLORIDE 0.9 % IV SOLN
80.0000 mg/m2 | Freq: Once | INTRAVENOUS | Status: AC
  Administered 2018-12-09: 150 mg via INTRAVENOUS
  Filled 2018-12-09: qty 25

## 2018-12-09 MED ORDER — ACETAMINOPHEN 325 MG PO TABS
650.0000 mg | ORAL_TABLET | Freq: Once | ORAL | Status: AC
  Administered 2018-12-09: 650 mg via ORAL

## 2018-12-09 MED ORDER — DEXAMETHASONE SODIUM PHOSPHATE 10 MG/ML IJ SOLN
10.0000 mg | Freq: Once | INTRAMUSCULAR | Status: AC
  Administered 2018-12-09: 10 mg via INTRAVENOUS

## 2018-12-09 MED ORDER — SODIUM CHLORIDE 0.9% FLUSH
10.0000 mL | INTRAVENOUS | Status: DC | PRN
  Administered 2018-12-09: 10 mL
  Filled 2018-12-09: qty 10

## 2018-12-09 MED ORDER — HEPARIN SOD (PORK) LOCK FLUSH 100 UNIT/ML IV SOLN
500.0000 [IU] | Freq: Once | INTRAVENOUS | Status: AC | PRN
  Administered 2018-12-09: 500 [IU]
  Filled 2018-12-09: qty 5

## 2018-12-09 MED ORDER — DIPHENHYDRAMINE HCL 50 MG/ML IJ SOLN
25.0000 mg | Freq: Once | INTRAMUSCULAR | Status: AC
  Administered 2018-12-09: 25 mg via INTRAVENOUS

## 2018-12-09 MED ORDER — DIPHENHYDRAMINE HCL 50 MG/ML IJ SOLN
INTRAMUSCULAR | Status: AC
  Filled 2018-12-09: qty 1

## 2018-12-09 MED ORDER — DEXAMETHASONE SODIUM PHOSPHATE 10 MG/ML IJ SOLN
INTRAMUSCULAR | Status: AC
  Filled 2018-12-09: qty 1

## 2018-12-09 MED ORDER — FAMOTIDINE IN NACL 20-0.9 MG/50ML-% IV SOLN
20.0000 mg | Freq: Once | INTRAVENOUS | Status: AC
  Administered 2018-12-09: 20 mg via INTRAVENOUS

## 2018-12-09 MED ORDER — ACETAMINOPHEN 325 MG PO TABS
ORAL_TABLET | ORAL | Status: AC
  Filled 2018-12-09: qty 2

## 2018-12-09 NOTE — Patient Instructions (Signed)
Coronavirus (COVID-19) Are you at risk?  Are you at risk for the Coronavirus (COVID-19)?  To be considered HIGH RISK for Coronavirus (COVID-19), you have to meet the following criteria:  . Traveled to [place], [place], [place], [place] or [place]; or in the [place] to [place], [place], [place], or [place]; and have fever, cough, and shortness of breath within the last 2 weeks of travel OR . Been in close contact with a person diagnosed with COVID-19 within the last 2 weeks and have fever, cough, and shortness of breath . IF YOU DO NOT MEET THESE CRITERIA, YOU ARE CONSIDERED LOW RISK FOR COVID-19.  What to do if you are HIGH RISK for COVID-19?  . If you are having a medical emergency, call 911. . Seek medical care right away. Before you go to a doctor's office, urgent care or emergency department, call ahead and tell them about your recent travel, contact with someone diagnosed with COVID-19, and your symptoms. You should receive instructions from your physician's office regarding next steps of care.  . When you arrive at healthcare provider, tell the healthcare staff immediately you have returned from visiting [place], [place], [place], [place] or [place]; or traveled in the [place] to [place], [place], [place], or [place]; in the last two weeks or you have been in close contact with a person diagnosed with COVID-19 in the last 2 weeks.   . Tell the health care staff about your symptoms: fever, cough and shortness of breath. . After you have been seen by a medical provider, you will be either: o Tested for (COVID-19) and discharged home on quarantine except to seek medical care if symptoms worsen, and asked to  - Stay home and avoid contact with others until you get your results (4-5 days)  - Avoid travel on public transportation if possible (such as bus, train, or airplane) or o Sent to the Emergency Department by EMS for evaluation, COVID-19 testing, and possible  admission depending on your condition and test results.  What to do if you are LOW RISK for COVID-19?  Reduce your risk of any infection by using the same precautions used for avoiding the common cold or flu:  . Wash your hands often with soap and warm water for at least 20 seconds.  If soap and water are not readily available, use an alcohol-based hand sanitizer with at least 60% alcohol.  . If coughing or sneezing, cover your mouth and nose by coughing or sneezing into the elbow areas of your shirt or coat, into a tissue or into your sleeve (not your hands). . Avoid shaking hands with others and consider head nods or verbal greetings only. . Avoid touching your eyes, nose, or mouth with unwashed hands.  . Avoid close contact with people who are sick. . Avoid places or events with large numbers of people in one location, like concerts or sporting events. . Carefully consider travel plans you have or are making. . If you are planning any travel outside or inside the [place], visit the CDC's Travelers' Health webpage for the latest health notices. . If you have some symptoms but not all symptoms, continue to monitor at home and seek medical attention if your symptoms worsen. . If you are having a medical emergency, call 911.   ADDITIONAL HEALTHCARE OPTIONS FOR PATIENTS  [facility] / e-Visit: [email or web address]         [facility]: [phone]  Onset   Urgent Care: [facility]: [place] Discharge Instructions for Patients Receiving Chemotherapy  Today you received the following chemotherapy agents Herceptin and Taxol  To help prevent nausea and vomiting after your treatment, we encourage you to take your nausea medication as directed.   If you develop nausea and vomiting that is not controlled by your nausea medication, call the clinic.    BELOW ARE SYMPTOMS THAT SHOULD BE REPORTED IMMEDIATELY:  *FEVER GREATER THAN 100.5 F  *CHILLS WITH OR WITHOUT FEVER  NAUSEA AND VOMITING THAT IS NOT CONTROLLED WITH YOUR NAUSEA MEDICATION  *UNUSUAL SHORTNESS OF BREATH  *UNUSUAL BRUISING OR BLEEDING  TENDERNESS IN MOUTH AND THROAT WITH OR WITHOUT PRESENCE OF ULCERS  *URINARY PROBLEMS  *BOWEL PROBLEMS  UNUSUAL RASH Items with * indicate a potential emergency and should be followed up as soon as possible.  Feel free to call the clinic should you have any questions or concerns. The clinic phone number is [phone].  Please show the [place] at check-in to the Emergency Department and triage nurse.

## 2018-12-16 ENCOUNTER — Inpatient Hospital Stay: Payer: BC Managed Care – PPO

## 2018-12-16 ENCOUNTER — Other Ambulatory Visit: Payer: Self-pay

## 2018-12-16 VITALS — BP 127/74 | HR 65 | Temp 98.3°F | Resp 18

## 2018-12-16 DIAGNOSIS — K5900 Constipation, unspecified: Secondary | ICD-10-CM | POA: Diagnosis not present

## 2018-12-16 DIAGNOSIS — Z5111 Encounter for antineoplastic chemotherapy: Secondary | ICD-10-CM | POA: Diagnosis not present

## 2018-12-16 DIAGNOSIS — C50411 Malignant neoplasm of upper-outer quadrant of right female breast: Secondary | ICD-10-CM | POA: Diagnosis not present

## 2018-12-16 DIAGNOSIS — Z171 Estrogen receptor negative status [ER-]: Secondary | ICD-10-CM

## 2018-12-16 DIAGNOSIS — Z5112 Encounter for antineoplastic immunotherapy: Secondary | ICD-10-CM | POA: Diagnosis not present

## 2018-12-16 DIAGNOSIS — C969 Malignant neoplasm of lymphoid, hematopoietic and related tissue, unspecified: Secondary | ICD-10-CM | POA: Diagnosis not present

## 2018-12-16 DIAGNOSIS — Z95828 Presence of other vascular implants and grafts: Secondary | ICD-10-CM

## 2018-12-16 DIAGNOSIS — L659 Nonscarring hair loss, unspecified: Secondary | ICD-10-CM | POA: Diagnosis not present

## 2018-12-16 DIAGNOSIS — Z79899 Other long term (current) drug therapy: Secondary | ICD-10-CM | POA: Diagnosis not present

## 2018-12-16 LAB — CBC WITH DIFFERENTIAL (CANCER CENTER ONLY)
Abs Immature Granulocytes: 0.03 10*3/uL (ref 0.00–0.07)
Basophils Absolute: 0 10*3/uL (ref 0.0–0.1)
Basophils Relative: 0 %
Eosinophils Absolute: 0.1 10*3/uL (ref 0.0–0.5)
Eosinophils Relative: 2 %
HCT: 33.9 % — ABNORMAL LOW (ref 36.0–46.0)
Hemoglobin: 11.4 g/dL — ABNORMAL LOW (ref 12.0–15.0)
Immature Granulocytes: 1 %
Lymphocytes Relative: 27 %
Lymphs Abs: 1.5 10*3/uL (ref 0.7–4.0)
MCH: 31.8 pg (ref 26.0–34.0)
MCHC: 33.6 g/dL (ref 30.0–36.0)
MCV: 94.4 fL (ref 80.0–100.0)
Monocytes Absolute: 0.2 10*3/uL (ref 0.1–1.0)
Monocytes Relative: 4 %
Neutro Abs: 3.6 10*3/uL (ref 1.7–7.7)
Neutrophils Relative %: 66 %
Platelet Count: 227 10*3/uL (ref 150–400)
RBC: 3.59 MIL/uL — ABNORMAL LOW (ref 3.87–5.11)
RDW: 14.1 % (ref 11.5–15.5)
WBC Count: 5.4 10*3/uL (ref 4.0–10.5)
nRBC: 0 % (ref 0.0–0.2)

## 2018-12-16 LAB — CMP (CANCER CENTER ONLY)
ALT: 14 U/L (ref 0–44)
AST: 11 U/L — ABNORMAL LOW (ref 15–41)
Albumin: 3.7 g/dL (ref 3.5–5.0)
Alkaline Phosphatase: 64 U/L (ref 38–126)
Anion gap: 10 (ref 5–15)
BUN: 14 mg/dL (ref 6–20)
CO2: 24 mmol/L (ref 22–32)
Calcium: 9 mg/dL (ref 8.9–10.3)
Chloride: 107 mmol/L (ref 98–111)
Creatinine: 0.78 mg/dL (ref 0.44–1.00)
GFR, Est AFR Am: >60 mL/min (ref >60)
GFR, Estimated: >60 mL/min (ref >60)
Glucose, Bld: 110 mg/dL — ABNORMAL HIGH (ref 70–99)
Potassium: 4 mmol/L (ref 3.5–5.1)
Sodium: 141 mmol/L (ref 135–145)
Total Bilirubin: 0.3 mg/dL (ref 0.3–1.2)
Total Protein: 6.5 g/dL (ref 6.5–8.1)

## 2018-12-16 MED ORDER — SODIUM CHLORIDE 0.9 % IV SOLN
80.0000 mg/m2 | Freq: Once | INTRAVENOUS | Status: AC
  Administered 2018-12-16: 11:00:00 150 mg via INTRAVENOUS
  Filled 2018-12-16: qty 25

## 2018-12-16 MED ORDER — HEPARIN SOD (PORK) LOCK FLUSH 100 UNIT/ML IV SOLN
500.0000 [IU] | Freq: Once | INTRAVENOUS | Status: AC | PRN
  Administered 2018-12-16: 12:00:00 500 [IU]
  Filled 2018-12-16: qty 5

## 2018-12-16 MED ORDER — DIPHENHYDRAMINE HCL 50 MG/ML IJ SOLN
25.0000 mg | Freq: Once | INTRAMUSCULAR | Status: AC
  Administered 2018-12-16: 09:00:00 25 mg via INTRAVENOUS

## 2018-12-16 MED ORDER — DIPHENHYDRAMINE HCL 50 MG/ML IJ SOLN
INTRAMUSCULAR | Status: AC
  Filled 2018-12-16: qty 1

## 2018-12-16 MED ORDER — DEXAMETHASONE SODIUM PHOSPHATE 10 MG/ML IJ SOLN
INTRAMUSCULAR | Status: AC
  Filled 2018-12-16: qty 1

## 2018-12-16 MED ORDER — ACETAMINOPHEN 325 MG PO TABS
ORAL_TABLET | ORAL | Status: AC
  Filled 2018-12-16: qty 2

## 2018-12-16 MED ORDER — DIPHENHYDRAMINE HCL 25 MG PO CAPS
ORAL_CAPSULE | ORAL | Status: AC
  Filled 2018-12-16: qty 1

## 2018-12-16 MED ORDER — FAMOTIDINE IN NACL 20-0.9 MG/50ML-% IV SOLN
20.0000 mg | Freq: Once | INTRAVENOUS | Status: AC
  Administered 2018-12-16: 20 mg via INTRAVENOUS

## 2018-12-16 MED ORDER — SODIUM CHLORIDE 0.9% FLUSH
10.0000 mL | INTRAVENOUS | Status: DC | PRN
  Administered 2018-12-16: 10 mL
  Filled 2018-12-16: qty 10

## 2018-12-16 MED ORDER — ACETAMINOPHEN 325 MG PO TABS
650.0000 mg | ORAL_TABLET | Freq: Once | ORAL | Status: AC
  Administered 2018-12-16: 09:00:00 650 mg via ORAL

## 2018-12-16 MED ORDER — SODIUM CHLORIDE 0.9 % IV SOLN
Freq: Once | INTRAVENOUS | Status: AC
  Administered 2018-12-16: 09:00:00 via INTRAVENOUS
  Filled 2018-12-16: qty 250

## 2018-12-16 MED ORDER — TRASTUZUMAB-DKST CHEMO 150 MG IV SOLR
150.0000 mg | Freq: Once | INTRAVENOUS | Status: AC
  Administered 2018-12-16: 10:00:00 150 mg via INTRAVENOUS
  Filled 2018-12-16: qty 7.14

## 2018-12-16 MED ORDER — FAMOTIDINE IN NACL 20-0.9 MG/50ML-% IV SOLN
INTRAVENOUS | Status: AC
  Filled 2018-12-16: qty 50

## 2018-12-16 MED ORDER — DEXAMETHASONE SODIUM PHOSPHATE 10 MG/ML IJ SOLN
10.0000 mg | Freq: Once | INTRAMUSCULAR | Status: AC
  Administered 2018-12-16: 10 mg via INTRAVENOUS

## 2018-12-16 MED ORDER — SODIUM CHLORIDE 0.9% FLUSH
10.0000 mL | Freq: Once | INTRAVENOUS | Status: AC
  Administered 2018-12-16: 10 mL
  Filled 2018-12-16: qty 10

## 2018-12-16 NOTE — Assessment & Plan Note (Signed)
10/11/2018:Patient palpated a right breast mass, mammogram revealed 1.6 cm mass, biopsy revealed grade 3 invasive ductal carcinoma ER 0%, PR 0%, Ki-67 20%, HER-2 3+ positive, T1CN0 stage IA  Treatment plan: 1. Neoadjuvant Taxol and Herceptin weekly x12. Followed by Herceptin maintenance therapy every 3 weeks to complete 1 year. 2.Breast conserving surgery with sentinel lymph node biopsy 3.Adjuvant radiation therapy Upbeat clinical trial: No adverse effects from participating in the trial ---------------------------------------------------------------------------------------------------------------------------------------- Current treatment: Cycle8day 1 Taxol Herceptin Echocardiogram: 10/22/2018: EF 55 to 60%  Chemo toxicities: Constipation She is using cold cap as well as icing her hands and feet. Monitoring closely for toxicities

## 2018-12-16 NOTE — Patient Instructions (Signed)
[facility] Discharge Instructions for Patients Receiving Chemotherapy  Today you received the following chemotherapy agents:  Herceptin and Taxol.  To help prevent nausea and vomiting after your treatment, we encourage you to take your nausea medication as directed.   If you develop nausea and vomiting that is not controlled by your nausea medication, call the clinic.   BELOW ARE SYMPTOMS THAT SHOULD BE REPORTED IMMEDIATELY:  *FEVER GREATER THAN 100.5 F  *CHILLS WITH OR WITHOUT FEVER  NAUSEA AND VOMITING THAT IS NOT CONTROLLED WITH YOUR NAUSEA MEDICATION  *UNUSUAL SHORTNESS OF BREATH  *UNUSUAL BRUISING OR BLEEDING  TENDERNESS IN MOUTH AND THROAT WITH OR WITHOUT PRESENCE OF ULCERS  *URINARY PROBLEMS  *BOWEL PROBLEMS  UNUSUAL RASH Items with * indicate a potential emergency and should be followed up as soon as possible.  Feel free to call the clinic should you have any questions or concerns. The clinic phone number is [phone].  Please show the CHEMO ALERT CARD at check-in to the Emergency Department and triage nurse.   

## 2018-12-16 NOTE — Progress Notes (Signed)
Pt stayed for 90 minutes post cooling period with the dignicap. No issues. Pt tolerated treatment well.

## 2018-12-21 ENCOUNTER — Other Ambulatory Visit (HOSPITAL_COMMUNITY): Payer: Self-pay | Admitting: General Surgery

## 2018-12-21 ENCOUNTER — Other Ambulatory Visit: Payer: Self-pay | Admitting: General Surgery

## 2018-12-22 ENCOUNTER — Other Ambulatory Visit: Payer: Self-pay | Admitting: General Surgery

## 2018-12-22 DIAGNOSIS — C50011 Malignant neoplasm of nipple and areola, right female breast: Secondary | ICD-10-CM

## 2018-12-22 NOTE — Progress Notes (Signed)
Patient Care Team: Redmon, Barth Kirks, PA-C as PCP - General (Nurse Practitioner)  DIAGNOSIS:    ICD-10-CM   1. Malignant neoplasm of upper-outer quadrant of right breast in female, estrogen receptor negative ([place])  C50.411    Z17.1     SUMMARY OF ONCOLOGIC HISTORY: Oncology History  Malignant neoplasm of upper-outer quadrant of right breast in female, estrogen receptor negative ([place])  10/11/2018 Initial Diagnosis   Patient palpated a right breast mass, mammogram revealed 1.6 cm mass, biopsy revealed grade 3 invasive ductal carcinoma ER 0%, PR 0%, Ki-67 20%, HER-2 3+ positive, T1CN0 stage IA   10/19/2018 Cancer Staging   Staging form: Breast, AJCC 8th Edition - Clinical stage from 10/19/2018: Stage IA (cT1c, cN0, cM0, G3, ER-, PR-, HER2+) - Signed by [name], MD on 10/19/2018   10/29/2018 -  Chemotherapy   The patient had trastuzumab (HERCEPTIN) 300 mg in sodium chloride 0.9 % 250 mL chemo infusion, 294 mg, Intravenous,  Once, 2 of 2 cycles Administration: 300 mg (10/29/2018), 150 mg (11/03/2018), 150 mg (12/02/2018), 150 mg (11/12/2018), 150 mg (11/25/2018), 150 mg (12/09/2018) PACLitaxel (TAXOL) 150 mg in sodium chloride 0.9 % 250 mL chemo infusion (</= 22m/m2), 80 mg/m2 = 150 mg, Intravenous,  Once, 2 of 3 cycles Administration: 150 mg (10/29/2018), 150 mg (11/03/2018), 150 mg (12/02/2018), 150 mg (11/12/2018), 150 mg (11/25/2018), 150 mg (12/09/2018), 150 mg (12/16/2018) trastuzumab-dkst (OGIVRI) 150 mg in sodium chloride 0.9 % 250 mL chemo infusion, 150 mg (100 % of original dose 150 mg), Intravenous,  Once, 1 of 2 cycles Dose modification: 150 mg (original dose 150 mg, Cycle 2, Reason: Other (see comments), Comment: Biosimilar Conversion), 450 mg (original dose 150 mg, Cycle 3, Reason: Other (see comments), Comment: Biosimilar Conversion) Administration: 150 mg (12/16/2018)  for chemotherapy treatment.    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  The Common Hereditary Gene Panel offered by [facility] includes sequencing and/or deletion duplication testing of the following 48 genes: APC, ATM, AXIN2, BARD1, BMPR1A, BRCA1, BRCA2, BRIP1, CDH1, CDK4, CDKN2A (p14ARF), CDKN2A (p16INK4a), CHEK2, CTNNA1, DICER1, EPCAM (Deletion/duplication testing only), GREM1 (promoter region deletion/duplication testing only), KIT, MEN1, MLH1, MSH2, MSH3, MSH6, MUTYH, NBN, NF1, NHTL1, PALB2, PDGFRA, PMS2, POLD1, POLE, PTEN, RAD50, RAD51C, RAD51D, RNF43, SDHB, SDHC, SDHD, SMAD4, SMARCA4. STK11, TP53, TSC1, TSC2, and VHL.  The following genes were evaluated for sequence changes only: SDHA and HOXB13 c.251G>A variant only.   Two VUS's also found - one in ATM c.133C>T and the other in POLE c.1280C>T.  These will not affect medical management. The report date is November 12, 2018.     CHIEF COMPLIANT: Cycle 8 Taxol Herceptin  INTERVAL HISTORY: [name] a 38y.o. with above-mentioned history of right breast cancer currently on neoadjuvant chemotherapy with weekly Taxol and Herceptin.She is a participant in the UpBeat clinical trial. She presents to the clinic today for cycle 8.  REVIEW OF SYSTEMS:   Constitutional: Denies fevers, chills or abnormal weight loss Eyes: Denies blurriness of vision Ears, nose, mouth, throat, and face: Denies mucositis or sore throat Respiratory: Denies cough, dyspnea or wheezes Cardiovascular: Denies palpitation, chest discomfort Gastrointestinal: Denies nausea, heartburn or change in bowel habits Skin: Denies abnormal skin rashes Lymphatics: Denies new lymphadenopathy or easy bruising Neurological: Denies numbness, tingling or new weaknesses Behavioral/Psych: Mood is stable, no new changes  Extremities: No lower extremity edema Breast: denies any pain or lumps or nodules in either  breasts All other systems were reviewed with the patient and are negative.  I have reviewed the past medical history,  past surgical history, social history and family history with the patient and they are unchanged from previous note.  ALLERGIES:  has No Known Allergies.  MEDICATIONS:  Current Outpatient Medications  Medication Sig Dispense Refill  . lidocaine-prilocaine (EMLA) cream Apply to affected area once 30 g 3  . LORazepam (ATIVAN) 0.5 MG tablet Take 1 tablet (0.5 mg total) by mouth at bedtime as needed for sleep. 30 tablet 0  . ondansetron (ZOFRAN) 8 MG tablet Take 1 tablet (8 mg total) by mouth 2 (two) times daily as needed (Nausea or vomiting). 30 tablet 1  . prochlorperazine (COMPAZINE) 10 MG tablet Take 1 tablet (10 mg total) by mouth every 6 (six) hours as needed (Nausea or vomiting). 30 tablet 1   No current facility-administered medications for this visit.     PHYSICAL EXAMINATION: ECOG PERFORMANCE STATUS: 1 - Symptomatic but completely ambulatory  There were no vitals filed for this visit. There were no vitals filed for this visit.  GENERAL: alert, no distress and comfortable SKIN: skin color, texture, turgor are normal, no rashes or significant lesions EYES: normal, Conjunctiva are pink and non-injected, sclera clear OROPHARYNX: no exudate, no erythema and lips, buccal mucosa, and tongue normal  NECK: supple, thyroid normal size, non-tender, without nodularity LYMPH: no palpable lymphadenopathy in the cervical, axillary or inguinal LUNGS: clear to auscultation and percussion with normal breathing effort HEART: regular rate & rhythm and no murmurs and no lower extremity edema ABDOMEN: abdomen soft, non-tender and normal bowel sounds MUSCULOSKELETAL: no cyanosis of digits and no clubbing  NEURO: alert & oriented x 3 with fluent speech, no focal motor/sensory deficits EXTREMITIES: No lower extremity edema  LABORATORY DATA:  I have reviewed the data as listed CMP Latest Ref Rng & Units 12/16/2018 12/09/2018 12/02/2018  Glucose 70 - 99 mg/dL 110(H) 95 87  BUN 6 - 20 mg/dL _0 Creatinine 0.44 - 1.00 mg/dL 0.78 0.73 0.73  Sodium 135 - 145 mmol/L 141 140 141  Potassium 3.5 - 5.1 mmol/L 4.0 3.9 4.2  Chloride 98 - 111 mmol/L 107 107 109  CO2 22 - 32 mmol/L _1 Calcium 8.9 - 10.3 mg/dL 9.0 9.1 9.1  Total Protein 6.5 - 8.1 g/dL 6.5 6.6 6.7  Total Bilirubin 0.3 - 1.2 mg/dL 0.3 0.3 0.3  Alkaline Phos 38 - 126 U/L 64 65 67  AST 15 - 41 U/L 11(L) 10(L) 14(L)  ALT 0 - 44 U/L _2 Lab Results  Component Value Date   WBC 5.4 12/16/2018   HGB 11.4 (L) 12/16/2018   HCT 33.9 (L) 12/16/2018   MCV 94.4 12/16/2018   PLT 227 12/16/2018   NEUTROABS 3.6 12/16/2018    ASSESSMENT & PLAN:  Malignant neoplasm of upper-outer quadrant of right breast in female, estrogen receptor negative ([place]) 10/11/2018:Patient palpated a right breast mass, mammogram revealed 1.6 cm mass, biopsy revealed grade 3 invasive ductal carcinoma ER 0%, PR 0%, Ki-67 20%, HER-2 3+ positive, T1CN0 stage IA  Treatment plan: 1. Neoadjuvant Taxol and Herceptin weekly x12. Followed by Herceptin maintenance therapy every 3 weeks to complete 1 year. 2.Breast conserving surgery with sentinel lymph node biopsy 3.Adjuvant radiation therapy Upbeat clinical trial: No adverse effects from participating in the trial Lynch syndrome positive on genetic testing (I discussed with her about aspirin 600 mg for prevention of  colon cancer risk: Study published in Lancet dose) ---------------------------------------------------------------------------------------------------------------------------------------- Current treatment: Cycle8day 1 Taxol Herceptin Echocardiogram: 10/22/2018: EF 55 to 60%  Chemo toxicities: Constipation She is using cold cap as well as icing her hands and feet. So far she does not have neuropathy. Fatigue is her biggest symptom  Depression/anxiety/panic attacks: Sent a prescription for Wellbutrin. Monitoring closely for toxicities  No orders of the defined types were placed  in this encounter.  The patient has a good understanding of the overall plan. she agrees with it. she will call with any problems that may develop before the next visit here.  [name], MD 12/23/2018  [name] am acting as scribe for Dr. [name].  I have reviewed the above documentation for accuracy and completeness, and I agree with the above.

## 2018-12-23 ENCOUNTER — Inpatient Hospital Stay: Payer: BC Managed Care – PPO

## 2018-12-23 ENCOUNTER — Other Ambulatory Visit: Payer: Self-pay

## 2018-12-23 ENCOUNTER — Encounter: Payer: Self-pay | Admitting: *Deleted

## 2018-12-23 ENCOUNTER — Inpatient Hospital Stay (HOSPITAL_BASED_OUTPATIENT_CLINIC_OR_DEPARTMENT_OTHER): Payer: BC Managed Care – PPO | Admitting: Hematology and Oncology

## 2018-12-23 DIAGNOSIS — C50411 Malignant neoplasm of upper-outer quadrant of right female breast: Secondary | ICD-10-CM

## 2018-12-23 DIAGNOSIS — L659 Nonscarring hair loss, unspecified: Secondary | ICD-10-CM | POA: Diagnosis not present

## 2018-12-23 DIAGNOSIS — Z5112 Encounter for antineoplastic immunotherapy: Secondary | ICD-10-CM | POA: Diagnosis not present

## 2018-12-23 DIAGNOSIS — Z95828 Presence of other vascular implants and grafts: Secondary | ICD-10-CM

## 2018-12-23 DIAGNOSIS — Z79899 Other long term (current) drug therapy: Secondary | ICD-10-CM | POA: Diagnosis not present

## 2018-12-23 DIAGNOSIS — Z171 Estrogen receptor negative status [ER-]: Secondary | ICD-10-CM

## 2018-12-23 DIAGNOSIS — C969 Malignant neoplasm of lymphoid, hematopoietic and related tissue, unspecified: Secondary | ICD-10-CM | POA: Diagnosis not present

## 2018-12-23 DIAGNOSIS — Z5111 Encounter for antineoplastic chemotherapy: Secondary | ICD-10-CM | POA: Diagnosis not present

## 2018-12-23 DIAGNOSIS — K5900 Constipation, unspecified: Secondary | ICD-10-CM | POA: Diagnosis not present

## 2018-12-23 LAB — CMP (CANCER CENTER ONLY)
ALT: 13 U/L (ref 0–44)
AST: 10 U/L — ABNORMAL LOW (ref 15–41)
Albumin: 3.9 g/dL (ref 3.5–5.0)
Alkaline Phosphatase: 63 U/L (ref 38–126)
Anion gap: 9 (ref 5–15)
BUN: 11 mg/dL (ref 6–20)
CO2: 25 mmol/L (ref 22–32)
Calcium: 8.9 mg/dL (ref 8.9–10.3)
Chloride: 107 mmol/L (ref 98–111)
Creatinine: 0.75 mg/dL (ref 0.44–1.00)
GFR, Est AFR Am: >60 mL/min (ref >60)
GFR, Estimated: >60 mL/min (ref >60)
Glucose, Bld: 96 mg/dL (ref 70–99)
Potassium: 4.5 mmol/L (ref 3.5–5.1)
Sodium: 141 mmol/L (ref 135–145)
Total Bilirubin: 0.2 mg/dL — ABNORMAL LOW (ref 0.3–1.2)
Total Protein: 6.7 g/dL (ref 6.5–8.1)

## 2018-12-23 LAB — CBC WITH DIFFERENTIAL (CANCER CENTER ONLY)
Abs Immature Granulocytes: 0.02 10*3/uL (ref 0.00–0.07)
Basophils Absolute: 0 10*3/uL (ref 0.0–0.1)
Basophils Relative: 1 %
Eosinophils Absolute: 0.1 10*3/uL (ref 0.0–0.5)
Eosinophils Relative: 1 %
HCT: 35.2 % — ABNORMAL LOW (ref 36.0–46.0)
Hemoglobin: 11.8 g/dL — ABNORMAL LOW (ref 12.0–15.0)
Immature Granulocytes: 0 %
Lymphocytes Relative: 26 %
Lymphs Abs: 1.4 10*3/uL (ref 0.7–4.0)
MCH: 31.5 pg (ref 26.0–34.0)
MCHC: 33.5 g/dL (ref 30.0–36.0)
MCV: 93.9 fL (ref 80.0–100.0)
Monocytes Absolute: 0.2 10*3/uL (ref 0.1–1.0)
Monocytes Relative: 4 %
Neutro Abs: 3.6 10*3/uL (ref 1.7–7.7)
Neutrophils Relative %: 68 %
Platelet Count: 229 10*3/uL (ref 150–400)
RBC: 3.75 MIL/uL — ABNORMAL LOW (ref 3.87–5.11)
RDW: 14.3 % (ref 11.5–15.5)
WBC Count: 5.3 10*3/uL (ref 4.0–10.5)
nRBC: 0 % (ref 0.0–0.2)

## 2018-12-23 MED ORDER — DEXAMETHASONE SODIUM PHOSPHATE 10 MG/ML IJ SOLN
INTRAMUSCULAR | Status: AC
  Filled 2018-12-23: qty 1

## 2018-12-23 MED ORDER — HEPARIN SOD (PORK) LOCK FLUSH 100 UNIT/ML IV SOLN
500.0000 [IU] | Freq: Once | INTRAVENOUS | Status: AC | PRN
  Administered 2018-12-23: 500 [IU]
  Filled 2018-12-23: qty 5

## 2018-12-23 MED ORDER — DIPHENHYDRAMINE HCL 50 MG/ML IJ SOLN
INTRAMUSCULAR | Status: AC
  Filled 2018-12-23: qty 1

## 2018-12-23 MED ORDER — ACETAMINOPHEN 325 MG PO TABS
650.0000 mg | ORAL_TABLET | Freq: Once | ORAL | Status: AC
  Administered 2018-12-23: 650 mg via ORAL

## 2018-12-23 MED ORDER — FAMOTIDINE IN NACL 20-0.9 MG/50ML-% IV SOLN
INTRAVENOUS | Status: AC
  Filled 2018-12-23: qty 50

## 2018-12-23 MED ORDER — ACETAMINOPHEN 325 MG PO TABS
ORAL_TABLET | ORAL | Status: AC
  Filled 2018-12-23: qty 2

## 2018-12-23 MED ORDER — SODIUM CHLORIDE 0.9% FLUSH
10.0000 mL | INTRAVENOUS | Status: DC | PRN
  Administered 2018-12-23: 10 mL
  Filled 2018-12-23: qty 10

## 2018-12-23 MED ORDER — TRASTUZUMAB-DKST CHEMO 150 MG IV SOLR
150.0000 mg | Freq: Once | INTRAVENOUS | Status: AC
  Administered 2018-12-23: 150 mg via INTRAVENOUS
  Filled 2018-12-23: qty 7.14

## 2018-12-23 MED ORDER — SODIUM CHLORIDE 0.9 % IV SOLN
80.0000 mg/m2 | Freq: Once | INTRAVENOUS | Status: AC
  Administered 2018-12-23: 150 mg via INTRAVENOUS
  Filled 2018-12-23: qty 25

## 2018-12-23 MED ORDER — SODIUM CHLORIDE 0.9 % IV SOLN
Freq: Once | INTRAVENOUS | Status: AC
  Administered 2018-12-23: 11:00:00 via INTRAVENOUS
  Filled 2018-12-23: qty 250

## 2018-12-23 MED ORDER — DIPHENHYDRAMINE HCL 50 MG/ML IJ SOLN
25.0000 mg | Freq: Once | INTRAMUSCULAR | Status: AC
  Administered 2018-12-23: 25 mg via INTRAVENOUS

## 2018-12-23 MED ORDER — FAMOTIDINE IN NACL 20-0.9 MG/50ML-% IV SOLN
20.0000 mg | Freq: Once | INTRAVENOUS | Status: AC
  Administered 2018-12-23: 20 mg via INTRAVENOUS

## 2018-12-23 MED ORDER — BUPROPION HCL ER (XL) 150 MG PO TB24: 150.0000 mg | ORAL_TABLET | Freq: Every day | ORAL | 3 refills | Status: DC

## 2018-12-23 MED ORDER — SODIUM CHLORIDE 0.9% FLUSH
10.0000 mL | Freq: Once | INTRAVENOUS | Status: AC
  Administered 2018-12-23: 10 mL
  Filled 2018-12-23: qty 10

## 2018-12-23 MED ORDER — DEXAMETHASONE SODIUM PHOSPHATE 10 MG/ML IJ SOLN
10.0000 mg | Freq: Once | INTRAMUSCULAR | Status: AC
  Administered 2018-12-23: 10 mg via INTRAVENOUS

## 2018-12-23 NOTE — Patient Instructions (Signed)
[facility] Discharge Instructions for Patients Receiving Chemotherapy  Today you received the following chemotherapy agents: Trastuzumab-dkst (Ogivri) and Paclitaxel (Taxol)  To help prevent nausea and vomiting after your treatment, we encourage you to take your nausea medication as directed.   If you develop nausea and vomiting that is not controlled by your nausea medication, call the clinic.   BELOW ARE SYMPTOMS THAT SHOULD BE REPORTED IMMEDIATELY:  *FEVER GREATER THAN 100.5 F  *CHILLS WITH OR WITHOUT FEVER  NAUSEA AND VOMITING THAT IS NOT CONTROLLED WITH YOUR NAUSEA MEDICATION  *UNUSUAL SHORTNESS OF BREATH  *UNUSUAL BRUISING OR BLEEDING  TENDERNESS IN MOUTH AND THROAT WITH OR WITHOUT PRESENCE OF ULCERS  *URINARY PROBLEMS  *BOWEL PROBLEMS  UNUSUAL RASH Items with * indicate a potential emergency and should be followed up as soon as possible.  Feel free to call the clinic should you have any questions or concerns. The clinic phone number is [phone].  Please show the [facility] at check-in to the Emergency Department and triage nurse.  Coronavirus (COVID-19) Are you at risk?  Are you at risk for the Coronavirus (COVID-19)?  To be considered HIGH RISK for Coronavirus (COVID-19), you have to meet the following criteria:  . Traveled to [place], [place], [place], [place] or [place]; or in the [place] to [place], [place], [place], or [place]; and have fever, cough, and shortness of breath within the last 2 weeks of travel OR . Been in close contact with a person diagnosed with COVID-19 within the last 2 weeks and have fever, cough, and shortness of breath . IF YOU DO NOT MEET THESE CRITERIA, YOU ARE CONSIDERED LOW RISK FOR COVID-19.  What to do if you are HIGH RISK for COVID-19?  Marland Kitchen If you are having a medical emergency, call 911. . Seek medical care right away. Before you go to a doctor's office, urgent care or emergency department,  call ahead and tell them about your recent travel, contact with someone diagnosed with COVID-19, and your symptoms. You should receive instructions from your physician's office regarding next steps of care.  . When you arrive at healthcare provider, tell the healthcare staff immediately you have returned from visiting [place], [place], [place], [place] or [place]; or traveled in the [place] to [place], [place], [place], or [place]; in the last two weeks or you have been in close contact with a person diagnosed with COVID-19 in the last 2 weeks.   . Tell the health care staff about your symptoms: fever, cough and shortness of breath. . After you have been seen by a medical provider, you will be either: o Tested for (COVID-19) and discharged home on quarantine except to seek medical care if symptoms worsen, and asked to  - Stay home and avoid contact with others until you get your results (4-5 days)  - Avoid travel on public transportation if possible (such as bus, train, or airplane) or o Sent to the Emergency Department by EMS for evaluation, COVID-19 testing, and possible admission depending on your condition and test results.  What to do if you are LOW RISK for COVID-19?  Reduce your risk of any infection by using the same precautions used for avoiding the common cold or flu:  Marland Kitchen Wash your hands often with soap and warm water for at least 20 seconds.  If soap and water are not readily available, use an alcohol-based hand sanitizer with at least 60% alcohol.  Marland Kitchen  If coughing or sneezing, cover your mouth and nose by coughing or sneezing into the elbow areas of your shirt or coat, into a tissue or into your sleeve (not your hands). . Avoid shaking hands with others and consider head nods or verbal greetings only. . Avoid touching your eyes, nose, or mouth with unwashed hands.  . Avoid close contact with people who are sick. . Avoid places or events with large numbers of people in one  location, like concerts or sporting events. . Carefully consider travel plans you have or are making. . If you are planning any travel outside or inside the [place], visit the CDC's Travelers' Health webpage for the latest health notices. . If you have some symptoms but not all symptoms, continue to monitor at home and seek medical attention if your symptoms worsen. . If you are having a medical emergency, call 911.   ADDITIONAL HEALTHCARE OPTIONS FOR PATIENTS  [facility] / e-Visit: [email or web address]         [facility]: [phone]  [facility]: [phone]                   [facility]: [phone]   

## 2018-12-23 NOTE — Patient Instructions (Signed)

## 2018-12-24 ENCOUNTER — Ambulatory Visit: Payer: BC Managed Care – PPO | Admitting: Gastroenterology

## 2018-12-24 ENCOUNTER — Other Ambulatory Visit: Payer: Self-pay | Admitting: Gastroenterology

## 2018-12-24 ENCOUNTER — Encounter: Payer: Self-pay | Admitting: Gastroenterology

## 2018-12-24 VITALS — BP 122/84 | HR 76 | Temp 98.5°F | Ht 63.25 in | Wt 172.5 lb

## 2018-12-24 DIAGNOSIS — Z1509 Genetic susceptibility to other malignant neoplasm: Secondary | ICD-10-CM

## 2018-12-24 MED ORDER — PEG 3350-KCL-NA BICARB-NACL 420 G PO SOLR: 4000.0000 mL | ORAL | 0 refills | Status: DC

## 2018-12-24 NOTE — Progress Notes (Signed)
HPI: This is a very pleasant 38 year old woman who was referred to me by [name], PA-C  to evaluate Lynch syndrome.    Chief complaint is newly diagnosed Lynch syndrome  She has cyclical constipation around the time of her chemotherapy which is once weekly.  She will be completing chemotherapy with her 12th out of 12 doses mid-September.  She is planning for double mastectomy October 20.  She has no real GI issues other than that, no serious abdominal pains, no overt bleeding, no nausea or vomiting.  Her father has had colon polyps but no colon cancer in the family.    Old Data Reviewed: She was recently found to have what seems like an early stage invasive ductal carcinoma of her breast.  Genetic testing was performed and she was found to have a mono allelic PMS2 deletion.  This is a pathogenic mutation consistent with Lynch syndrome.  She has had appropriate genetic counseling.   Review of systems: Pertinent positive and negative review of systems were noted in the above HPI section. All other review negative.   Past Medical History:  Diagnosis Date  . Anxiety    over cancer diagnosis  . Arthritis   . Breast cancer ([place])   . Family history of breast cancer   . Lynch syndrome   . Scleroderma [facility])     Past Surgical History:  Procedure Laterality Date  . CERVICAL FUSION  2019  . CESAREAN SECTION  2005  . CESAREAN SECTION  02/27/2011   Procedure: CESAREAN SECTION;  Surgeon: Shon Millet II;  Location: [facility];  Service: Gynecology;  Laterality: N/A;  repeat  . PORTACATH PLACEMENT Right 10/20/2018   Procedure: INSERTION PORT-A-CATH WITH ULTRASOUND;  Surgeon: Rolm Bookbinder, MD;  Location: [facility];  Service: General;  Laterality: Right;    Current Outpatient Medications  Medication Sig Dispense Refill  . buPROPion (WELLBUTRIN XL) 150 MG 24 hr tablet Take 1 tablet (150 mg total) by mouth daily. 30 tablet 3  . lidocaine-prilocaine (EMLA) cream  Apply to affected area once 30 g 3  . LORazepam (ATIVAN) 0.5 MG tablet Take 1 tablet (0.5 mg total) by mouth at bedtime as needed for sleep. 30 tablet 0  . ondansetron (ZOFRAN) 8 MG tablet Take 1 tablet (8 mg total) by mouth 2 (two) times daily as needed (Nausea or vomiting). (Patient taking differently: Take 8 mg by mouth as needed (Nausea or vomiting). ) 30 tablet 1   No current facility-administered medications for this visit.     Allergies as of 12/24/2018  . (No Known Allergies)    Family History  Problem Relation Age of Onset  . Breast cancer Other   . Breast cancer Other   . Hashimoto's thyroiditis Son   . Ulcerative colitis Niece   . Ulcerative colitis Mother   . Diabetes Mother   . Colon polyps Father   . Diabetes Father   . Rheum arthritis Maternal Grandmother   . Heart disease Maternal Grandfather   . Breast cancer Other        paternal great grandmother  . Colon cancer Cousin        _0  cousin    Social History   Socioeconomic History  . Marital status: Married    Spouse name: Not on file  . Number of children: 2  . Years of education: Not on file  . Highest education level: Not on file  Occupational History  . Occupation: Engineer, structural  .  Occupation: treatment coordinator  Social Needs  . Financial resource strain: Not on file  . Food insecurity    Worry: Not on file    Inability: Not on file  . Transportation needs    Medical: No    Non-medical: No  Tobacco Use  . Smoking status: Never Smoker  . Smokeless tobacco: Never Used  Substance and Sexual Activity  . Alcohol use: No  . Drug use: No  . Sexual activity: Yes    Birth control/protection: Surgical    Comment: husband has had vasectomy  Lifestyle  . Physical activity    Days per week: Not on file    Minutes per session: Not on file  . Stress: Not on file  Relationships  . Social Herbalist on phone: Not on file    Gets together: Not on file    Attends religious service:  Not on file    Active member of club or organization: Not on file    Attends meetings of clubs or organizations: Not on file    Relationship status: Not on file  . Intimate partner violence    Fear of current or ex partner: Not on file    Emotionally abused: Not on file    Physically abused: Not on file    Forced sexual activity: Not on file  Other Topics Concern  . Not on file  Social History Narrative  . Not on file     Physical Exam: BP 122/84 (BP Location: Left Arm, Patient Position: Sitting, Cuff Size: Normal)   Pulse 76   Temp 98.5 F (36.9 C)   Ht 5' 3.25" (1.607 m) Comment: height measured without shoes  Wt 172 lb 8 oz (78.2 kg)   LMP 11/12/2018   Breastfeeding No   BMI 30.32 kg/m  Constitutional: generally well-appearing Psychiatric: alert and oriented x3 Eyes: extraocular movements intact Mouth: oral pharynx moist, no lesions Neck: supple no lymphadenopathy Cardiovascular: heart regular rate and rhythm Lungs: clear to auscultation bilaterally Abdomen: soft, nontender, nondistended, no obvious ascites, no peritoneal signs, normal bowel sounds Extremities: no lower extremity edema bilaterally Skin: no lesions on visible extremities   Assessment and plan: 38 y.o. female with newly diagnosed Lynch syndrome, early stage breast cancer  We discussed Lynch syndrome and its implications.  She was already quite well versed with this already.  I recommended that we go ahead with a colonoscopy and upper endoscopy about 2 weeks after her final chemotherapy which will be in very early October.  That would give her 2 to 3 weeks prior to her double mastectomy as well.  I see no reason for any further blood tests or imaging studies prior to that.   Please see the "Patient Instructions" section for addition details about the plan.   Owens Loffler, MD [facility] 12/24/2018, 11:33 AM  Cc: [name], PA-C

## 2018-12-24 NOTE — Patient Instructions (Signed)
We will contact you to schedule a colonoscopy/upper endoscopy for the 1st week in October just as soon as the schedule comes out  Thank you for entrusting me with your care and choosing [facility].  Dr Ardis Hughs

## 2018-12-29 ENCOUNTER — Other Ambulatory Visit: Payer: Self-pay | Admitting: Hematology and Oncology

## 2018-12-29 ENCOUNTER — Telehealth: Payer: Self-pay | Admitting: *Deleted

## 2018-12-29 NOTE — Progress Notes (Signed)
Patient is starting to develop neuropathy.  Because of this we will reduce the dosage of Taxol to 65 mg/m.

## 2018-12-29 NOTE — Telephone Encounter (Signed)
RN received call from pt stating she started to experience neuropathy yesterday 12/28/2018.  Pt reports slight numbness/tingling in her right hand that is constant.  Pt denies interference with ADL's but states if she is writing for a prolonged amount of time, it does bother her.  Pt wanted to make Dr. Lindi Adie aware considering she will be starting cycle 9 of Taxol tomorrow.  RN will review with MD.

## 2018-12-30 ENCOUNTER — Other Ambulatory Visit: Payer: Self-pay

## 2018-12-30 ENCOUNTER — Inpatient Hospital Stay: Payer: BC Managed Care – PPO

## 2018-12-30 ENCOUNTER — Encounter: Payer: Self-pay | Admitting: *Deleted

## 2018-12-30 VITALS — BP 132/90 | HR 84 | Temp 98.3°F | Resp 17 | Ht 63.0 in | Wt 168.5 lb

## 2018-12-30 DIAGNOSIS — Z95828 Presence of other vascular implants and grafts: Secondary | ICD-10-CM

## 2018-12-30 DIAGNOSIS — Z171 Estrogen receptor negative status [ER-]: Secondary | ICD-10-CM | POA: Diagnosis not present

## 2018-12-30 DIAGNOSIS — C969 Malignant neoplasm of lymphoid, hematopoietic and related tissue, unspecified: Secondary | ICD-10-CM | POA: Diagnosis not present

## 2018-12-30 DIAGNOSIS — K5900 Constipation, unspecified: Secondary | ICD-10-CM | POA: Diagnosis not present

## 2018-12-30 DIAGNOSIS — C50411 Malignant neoplasm of upper-outer quadrant of right female breast: Secondary | ICD-10-CM

## 2018-12-30 DIAGNOSIS — L659 Nonscarring hair loss, unspecified: Secondary | ICD-10-CM | POA: Diagnosis not present

## 2018-12-30 DIAGNOSIS — Z5112 Encounter for antineoplastic immunotherapy: Secondary | ICD-10-CM | POA: Diagnosis not present

## 2018-12-30 DIAGNOSIS — Z79899 Other long term (current) drug therapy: Secondary | ICD-10-CM | POA: Diagnosis not present

## 2018-12-30 DIAGNOSIS — Z5111 Encounter for antineoplastic chemotherapy: Secondary | ICD-10-CM | POA: Diagnosis not present

## 2018-12-30 LAB — CBC WITH DIFFERENTIAL (CANCER CENTER ONLY)
Abs Immature Granulocytes: 0.02 10*3/uL (ref 0.00–0.07)
Basophils Absolute: 0 10*3/uL (ref 0.0–0.1)
Basophils Relative: 0 %
Eosinophils Absolute: 0.1 10*3/uL (ref 0.0–0.5)
Eosinophils Relative: 1 %
HCT: 37.8 % (ref 36.0–46.0)
Hemoglobin: 12.7 g/dL (ref 12.0–15.0)
Immature Granulocytes: 0 %
Lymphocytes Relative: 27 %
Lymphs Abs: 1.4 10*3/uL (ref 0.7–4.0)
MCH: 31.8 pg (ref 26.0–34.0)
MCHC: 33.6 g/dL (ref 30.0–36.0)
MCV: 94.5 fL (ref 80.0–100.0)
Monocytes Absolute: 0.2 10*3/uL (ref 0.1–1.0)
Monocytes Relative: 5 %
Neutro Abs: 3.6 10*3/uL (ref 1.7–7.7)
Neutrophils Relative %: 67 %
Platelet Count: 246 10*3/uL (ref 150–400)
RBC: 4 MIL/uL (ref 3.87–5.11)
RDW: 14 % (ref 11.5–15.5)
WBC Count: 5.4 10*3/uL (ref 4.0–10.5)
nRBC: 0 % (ref 0.0–0.2)

## 2018-12-30 LAB — CMP (CANCER CENTER ONLY)
ALT: 12 U/L (ref 0–44)
AST: 10 U/L — ABNORMAL LOW (ref 15–41)
Albumin: 4.1 g/dL (ref 3.5–5.0)
Alkaline Phosphatase: 59 U/L (ref 38–126)
Anion gap: 8 (ref 5–15)
BUN: 14 mg/dL (ref 6–20)
CO2: 26 mmol/L (ref 22–32)
Calcium: 9.3 mg/dL (ref 8.9–10.3)
Chloride: 107 mmol/L (ref 98–111)
Creatinine: 0.78 mg/dL (ref 0.44–1.00)
GFR, Est AFR Am: >60 mL/min (ref >60)
GFR, Estimated: >60 mL/min (ref >60)
Glucose, Bld: 109 mg/dL — ABNORMAL HIGH (ref 70–99)
Potassium: 4.2 mmol/L (ref 3.5–5.1)
Sodium: 141 mmol/L (ref 135–145)
Total Bilirubin: 0.4 mg/dL (ref 0.3–1.2)
Total Protein: 7 g/dL (ref 6.5–8.1)

## 2018-12-30 MED ORDER — SODIUM CHLORIDE 0.9% FLUSH
10.0000 mL | INTRAVENOUS | Status: DC | PRN
  Administered 2018-12-30: 10 mL
  Filled 2018-12-30: qty 10

## 2018-12-30 MED ORDER — DEXAMETHASONE SODIUM PHOSPHATE 10 MG/ML IJ SOLN
10.0000 mg | Freq: Once | INTRAMUSCULAR | Status: AC
  Administered 2018-12-30: 10 mg via INTRAVENOUS

## 2018-12-30 MED ORDER — DIPHENHYDRAMINE HCL 50 MG/ML IJ SOLN
INTRAMUSCULAR | Status: AC
  Filled 2018-12-30: qty 1

## 2018-12-30 MED ORDER — ACETAMINOPHEN 325 MG PO TABS
650.0000 mg | ORAL_TABLET | Freq: Once | ORAL | Status: AC
  Administered 2018-12-30: 650 mg via ORAL

## 2018-12-30 MED ORDER — ACETAMINOPHEN 325 MG PO TABS
ORAL_TABLET | ORAL | Status: AC
  Filled 2018-12-30: qty 2

## 2018-12-30 MED ORDER — SODIUM CHLORIDE 0.9% FLUSH
10.0000 mL | Freq: Once | INTRAVENOUS | Status: AC
  Administered 2018-12-30: 10 mL
  Filled 2018-12-30: qty 10

## 2018-12-30 MED ORDER — DIPHENHYDRAMINE HCL 50 MG/ML IJ SOLN
25.0000 mg | Freq: Once | INTRAMUSCULAR | Status: AC
  Administered 2018-12-30: 25 mg via INTRAVENOUS

## 2018-12-30 MED ORDER — DEXAMETHASONE SODIUM PHOSPHATE 10 MG/ML IJ SOLN
INTRAMUSCULAR | Status: AC
  Filled 2018-12-30: qty 1

## 2018-12-30 MED ORDER — SODIUM CHLORIDE 0.9 % IV SOLN
Freq: Once | INTRAVENOUS | Status: AC
  Administered 2018-12-30: 10:00:00 via INTRAVENOUS
  Filled 2018-12-30: qty 250

## 2018-12-30 MED ORDER — FAMOTIDINE IN NACL 20-0.9 MG/50ML-% IV SOLN
INTRAVENOUS | Status: AC
  Filled 2018-12-30: qty 50

## 2018-12-30 MED ORDER — HEPARIN SOD (PORK) LOCK FLUSH 100 UNIT/ML IV SOLN
500.0000 [IU] | Freq: Once | INTRAVENOUS | Status: AC | PRN
  Administered 2018-12-30: 500 [IU]
  Filled 2018-12-30: qty 5

## 2018-12-30 MED ORDER — TRASTUZUMAB-DKST CHEMO 150 MG IV SOLR
150.0000 mg | Freq: Once | INTRAVENOUS | Status: AC
  Administered 2018-12-30: 150 mg via INTRAVENOUS
  Filled 2018-12-30: qty 7.14

## 2018-12-30 MED ORDER — SODIUM CHLORIDE 0.9 % IV SOLN
65.0000 mg/m2 | Freq: Once | INTRAVENOUS | Status: AC
  Administered 2018-12-30: 120 mg via INTRAVENOUS
  Filled 2018-12-30: qty 20

## 2018-12-30 MED ORDER — FAMOTIDINE IN NACL 20-0.9 MG/50ML-% IV SOLN
20.0000 mg | Freq: Once | INTRAVENOUS | Status: AC
  Administered 2018-12-30: 20 mg via INTRAVENOUS

## 2018-12-30 NOTE — Assessment & Plan Note (Signed)
10/11/2018:Patient palpated a right breast mass, mammogram revealed 1.6 cm mass, biopsy revealed grade 3 invasive ductal carcinoma ER 0%, PR 0%, Ki-67 20%, HER-2 3+ positive, T1CN0 stage IA  Treatment plan: 1. Neoadjuvant Taxol and Herceptin weekly x12. Followed by Herceptin maintenance therapy every 3 weeks to complete 1 year. 2.Breast conserving surgery with sentinel lymph node biopsy 3.Adjuvant radiation therapy Upbeat clinical trial: No adverse effects from participating in the trial Lynch syndrome positive on genetic testing (I discussed with her about aspirin 600 mg for prevention of colon cancer risk: Study published in Lancet dose) ---------------------------------------------------------------------------------------------------------------------------------------- Current treatment: Cycle10day 1 Taxol Herceptin Echocardiogram: 10/22/2018: EF 55 to 60%  Chemo toxicities: Constipation She is using cold cap as well as icing her hands and feet. Fatigue is her biggest symptom  Chemo-induced peripheral neuropathy: Reduce the dosage of cycle 9 Taxol  Depression/anxiety/panic attacks: Sent a prescription for Wellbutrin. Monitoring closely for toxicities

## 2018-12-30 NOTE — Patient Instructions (Signed)
Cancer Center Discharge Instructions for Patients Receiving Chemotherapy  Today you received the following chemotherapy agents: Trastuzumab-dkst (Ogivri) and Paclitaxel (Taxol)  To help prevent nausea and vomiting after your treatment, we encourage you to take your nausea medication as directed.   If you develop nausea and vomiting that is not controlled by your nausea medication, call the clinic.   BELOW ARE SYMPTOMS THAT SHOULD BE REPORTED IMMEDIATELY:  *FEVER GREATER THAN 100.5 F  *CHILLS WITH OR WITHOUT FEVER  NAUSEA AND VOMITING THAT IS NOT CONTROLLED WITH YOUR NAUSEA MEDICATION  *UNUSUAL SHORTNESS OF BREATH  *UNUSUAL BRUISING OR BLEEDING  TENDERNESS IN MOUTH AND THROAT WITH OR WITHOUT PRESENCE OF ULCERS  *URINARY PROBLEMS  *BOWEL PROBLEMS  UNUSUAL RASH Items with * indicate a potential emergency and should be followed up as soon as possible.  Feel free to call the clinic should you have any questions or concerns. The clinic phone number is [phone].  Please show the CHEMO ALERT CARD at check-in to the Emergency Department and triage nurse.  Coronavirus (COVID-19) Are you at risk?  Are you at risk for the Coronavirus (COVID-19)?  To be considered HIGH RISK for Coronavirus (COVID-19), you have to meet the following criteria:  . Traveled to [place], [place], [place], [place] or [place]; or in the [place] to [place], [place], [place], or [place]; and have fever, cough, and shortness of breath within the last 2 weeks of travel OR . Been in close contact with a person diagnosed with COVID-19 within the last 2 weeks and have fever, cough, and shortness of breath . IF YOU DO NOT MEET THESE CRITERIA, YOU ARE CONSIDERED LOW RISK FOR COVID-19.  What to do if you are HIGH RISK for COVID-19?  . If you are having a medical emergency, call 911. . Seek medical care right away. Before you go to a doctor's office, urgent care or emergency department,  call ahead and tell them about your recent travel, contact with someone diagnosed with COVID-19, and your symptoms. You should receive instructions from your physician's office regarding next steps of care.  . When you arrive at healthcare provider, tell the healthcare staff immediately you have returned from visiting [place], [place], [place], [place] or [place]; or traveled in the [place] to [place], [place], [place], or [place]; in the last two weeks or you have been in close contact with a person diagnosed with COVID-19 in the last 2 weeks.   . Tell the health care staff about your symptoms: fever, cough and shortness of breath. . After you have been seen by a medical provider, you will be either: o Tested for (COVID-19) and discharged home on quarantine except to seek medical care if symptoms worsen, and asked to  - Stay home and avoid contact with others until you get your results (4-5 days)  - Avoid travel on public transportation if possible (such as bus, train, or airplane) or o Sent to the Emergency Department by EMS for evaluation, COVID-19 testing, and possible admission depending on your condition and test results.  What to do if you are LOW RISK for COVID-19?  Reduce your risk of any infection by using the same precautions used for avoiding the common cold or flu:  . Wash your hands often with soap and warm water for at least 20 seconds.  If soap and water are not readily available, use an alcohol-based hand sanitizer with at least 60% alcohol.  .   If coughing or sneezing, cover your mouth and nose by coughing or sneezing into the elbow areas of your shirt or coat, into a tissue or into your sleeve (not your hands). . Avoid shaking hands with others and consider head nods or verbal greetings only. . Avoid touching your eyes, nose, or mouth with unwashed hands.  . Avoid close contact with people who are sick. . Avoid places or events with large numbers of people in one  location, like concerts or sporting events. . Carefully consider travel plans you have or are making. . If you are planning any travel outside or inside the [place], visit the CDC's Travelers' Health webpage for the latest health notices. . If you have some symptoms but not all symptoms, continue to monitor at home and seek medical attention if your symptoms worsen. . If you are having a medical emergency, call 911.   ADDITIONAL HEALTHCARE OPTIONS FOR PATIENTS  [facility] / e-Visit: [email or web address]         [facility]: [phone]  [facility]: [phone]                   [facility]: [phone]   

## 2018-12-30 NOTE — Patient Instructions (Signed)

## 2019-01-05 DIAGNOSIS — Z1509 Genetic susceptibility to other malignant neoplasm: Secondary | ICD-10-CM | POA: Diagnosis not present

## 2019-01-05 DIAGNOSIS — C50411 Malignant neoplasm of upper-outer quadrant of right female breast: Secondary | ICD-10-CM | POA: Diagnosis not present

## 2019-01-05 DIAGNOSIS — Z171 Estrogen receptor negative status [ER-]: Secondary | ICD-10-CM | POA: Diagnosis not present

## 2019-01-05 NOTE — Progress Notes (Signed)
Patient Care Team: Redmon, Barth Kirks, PA-C as PCP - General (Nurse Practitioner)  DIAGNOSIS:    ICD-10-CM   1. Malignant neoplasm of upper-outer quadrant of right breast in female, estrogen receptor negative ([place])  C50.411    Z17.1     SUMMARY OF ONCOLOGIC HISTORY: Oncology History  Malignant neoplasm of upper-outer quadrant of right breast in female, estrogen receptor negative ([place])  10/11/2018 Initial Diagnosis   Patient palpated a right breast mass, mammogram revealed 1.6 cm mass, biopsy revealed grade 3 invasive ductal carcinoma ER 0%, PR 0%, Ki-67 20%, HER-2 3+ positive, T1CN0 stage IA   10/19/2018 Cancer Staging   Staging form: Breast, AJCC 8th Edition - Clinical stage from 10/19/2018: Stage IA (cT1c, cN0, cM0, G3, ER-, PR-, HER2+) - Signed by [name], MD on 10/19/2018   10/29/2018 -  Chemotherapy   The patient had trastuzumab (HERCEPTIN) 300 mg in sodium chloride 0.9 % 250 mL chemo infusion, 294 mg, Intravenous,  Once, 2 of 2 cycles Administration: 300 mg (10/29/2018), 150 mg (11/03/2018), 150 mg (12/02/2018), 150 mg (11/12/2018), 150 mg (11/25/2018), 150 mg (12/09/2018) PACLitaxel (TAXOL) 150 mg in sodium chloride 0.9 % 250 mL chemo infusion (</= 28m/m2), 80 mg/m2 = 150 mg, Intravenous,  Once, 3 of 3 cycles Dose modification: 65 mg/m2 (original dose 80 mg/m2, Cycle 3, Reason: Dose not tolerated) Administration: 150 mg (10/29/2018), 150 mg (11/03/2018), 150 mg (12/02/2018), 150 mg (11/12/2018), 150 mg (11/25/2018), 150 mg (12/09/2018), 150 mg (12/16/2018), 150 mg (12/23/2018), 120 mg (12/30/2018) trastuzumab-dkst (OGIVRI) 150 mg in sodium chloride 0.9 % 250 mL chemo infusion, 150 mg (100 % of original dose 150 mg), Intravenous,  Once, 2 of 2 cycles Dose modification: 150 mg (original dose 150 mg, Cycle 2, Reason: Other (see comments), Comment: Biosimilar Conversion), 450 mg (original dose 150 mg, Cycle 3, Reason: Other (see comments), Comment: Biosimilar Conversion) Administration: 150 mg  (12/16/2018), 150 mg (12/23/2018), 150 mg (12/30/2018)  for chemotherapy treatment.    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  The Common Hereditary Gene Panel offered by [facility] includes sequencing and/or deletion duplication testing of the following 48 genes: APC, ATM, AXIN2, BARD1, BMPR1A, BRCA1, BRCA2, BRIP1, CDH1, CDK4, CDKN2A (p14ARF), CDKN2A (p16INK4a), CHEK2, CTNNA1, DICER1, EPCAM (Deletion/duplication testing only), GREM1 (promoter region deletion/duplication testing only), KIT, MEN1, MLH1, MSH2, MSH3, MSH6, MUTYH, NBN, NF1, NHTL1, PALB2, PDGFRA, PMS2, POLD1, POLE, PTEN, RAD50, RAD51C, RAD51D, RNF43, SDHB, SDHC, SDHD, SMAD4, SMARCA4. STK11, TP53, TSC1, TSC2, and VHL.  The following genes were evaluated for sequence changes only: SDHA and HOXB13 c.251G>A variant only.   Two VUS's also found - one in ATM c.133C>T and the other in POLE c.1280C>T.  These will not affect medical management. The report date is November 12, 2018.     CHIEF COMPLIANT: Cycle10Taxol Herceptin  INTERVAL HISTORY: Teresa HEIDENis a 38y.o. with above-mentioned history of right breast cancer currently on neoadjuvant chemotherapy with weekly Taxol and Herceptin.She is a participant in the UpBeat clinical trial.She presents to the clinic todayfor cycle10.  She complained of her mild neuropathy which got better after reduce the dosage of treatment.  She continues to have constipation.  REVIEW OF SYSTEMS:   Constitutional: Denies fevers, chills or abnormal weight loss Eyes: Denies blurriness of vision Ears, nose, mouth, throat, and face: Denies mucositis or sore throat Respiratory: Denies cough, dyspnea or wheezes Cardiovascular: Denies palpitation, chest discomfort Gastrointestinal: Constipation Skin: Denies abnormal skin rashes Lymphatics:  Denies new lymphadenopathy or easy bruising Neurological: Neuropathy has improved after  reducing the dosage of chemo. Behavioral/Psych: Mood is stable, no new changes  Extremities: No lower extremity edema Breast: denies any pain or lumps or nodules in either breasts All other systems were reviewed with the patient and are negative.  I have reviewed the past medical history, past surgical history, social history and family history with the patient and they are unchanged from previous note.  ALLERGIES:  has No Known Allergies.  MEDICATIONS:  Current Outpatient Medications  Medication Sig Dispense Refill  . buPROPion (WELLBUTRIN XL) 150 MG 24 hr tablet Take 1 tablet (150 mg total) by mouth daily. 30 tablet 3  . lidocaine-prilocaine (EMLA) cream Apply to affected area once 30 g 3  . LORazepam (ATIVAN) 0.5 MG tablet Take 1 tablet (0.5 mg total) by mouth at bedtime as needed for sleep. 30 tablet 0  . ondansetron (ZOFRAN) 8 MG tablet Take 1 tablet (8 mg total) by mouth 2 (two) times daily as needed (Nausea or vomiting). (Patient taking differently: Take 8 mg by mouth as needed (Nausea or vomiting). ) 30 tablet 1  . polyethylene glycol-electrolytes (NULYTELY/GOLYTELY) 420 g solution Take 4,000 mLs by mouth as directed. 4000 mL 0   No current facility-administered medications for this visit.     PHYSICAL EXAMINATION: ECOG PERFORMANCE STATUS: 1 - Symptomatic but completely ambulatory  Vitals:   01/06/19 1110  BP: (!) 137/95  Pulse: 72  Resp: 18  Temp: 98.3 F (36.8 C)  SpO2: 99%   Filed Weights   01/06/19 1110  Weight: 168 lb 1.6 oz (76.2 kg)    GENERAL: alert, no distress and comfortable SKIN: skin color, texture, turgor are normal, no rashes or significant lesions EYES: normal, Conjunctiva are pink and non-injected, sclera clear OROPHARYNX: no exudate, no erythema and lips, buccal mucosa, and tongue normal  NECK: supple, thyroid normal size, non-tender, without nodularity LYMPH: no palpable lymphadenopathy in the cervical, axillary or inguinal LUNGS: clear to  auscultation and percussion with normal breathing effort HEART: regular rate & rhythm and no murmurs and no lower extremity edema ABDOMEN: abdomen soft, non-tender and normal bowel sounds MUSCULOSKELETAL: no cyanosis of digits and no clubbing  NEURO: alert & oriented x 3 with fluent speech, mild peripheral neuropathy EXTREMITIES: No lower extremity edema  LABORATORY DATA:  I have reviewed the data as listed CMP Latest Ref Rng & Units 12/30/2018 12/23/2018 12/16/2018  Glucose 70 - 99 mg/dL 109(H) 96 110(H)  BUN 6 - 20 mg/dL '14 11 14  ' Creatinine 0.44 - 1.00 mg/dL 0.78 0.75 0.78  Sodium 135 - 145 mmol/L 141 141 141  Potassium 3.5 - 5.1 mmol/L 4.2 4.5 4.0  Chloride 98 - 111 mmol/L 107 107 107  CO2 22 - 32 mmol/L '26 25 24  ' Calcium 8.9 - 10.3 mg/dL 9.3 8.9 9.0  Total Protein 6.5 - 8.1 g/dL 7.0 6.7 6.5  Total Bilirubin 0.3 - 1.2 mg/dL 0.4 0.2(L) 0.3  Alkaline Phos 38 - 126 U/L 59 63 64  AST 15 - 41 U/L 10(L) 10(L) 11(L)  ALT 0 - 44 U/L '12 13 14    ' Lab Results  Component Value Date   WBC 4.8 01/06/2019   HGB 11.9 (L) 01/06/2019   HCT 35.8 (L) 01/06/2019   MCV 93.0 01/06/2019   PLT 233 01/06/2019   NEUTROABS 3.3 01/06/2019    ASSESSMENT & PLAN:  Malignant neoplasm of upper-outer quadrant of right breast in female, estrogen receptor negative ([place])  10/11/2018:Patient palpated a right breast mass, mammogram revealed 1.6 cm mass, biopsy revealed grade 3 invasive ductal carcinoma ER 0%, PR 0%, Ki-67 20%, HER-2 3+ positive, T1CN0 stage IA  Treatment plan: 1. Neoadjuvant Taxol and Herceptin weekly x12. Followed by Herceptin maintenance therapy every 3 weeks to complete 1 year. 2.Breast conserving surgery with sentinel lymph node biopsy 3.Adjuvant radiation therapy Upbeat clinical trial: No adverse effects from participating in the trial Lynch syndrome positive on genetic testing (I discussed with her about aspirin 600 mg for prevention of colon cancer risk: Study published in Lancet  dose) ---------------------------------------------------------------------------------------------------------------------------------------- Current treatment: Cycle10day 1 Taxol Herceptin Echocardiogram: 10/22/2018: EF 55 to 60%  Chemo toxicities: Constipation She is using cold cap as well as icing her hands and feet. Fatigue is her biggest symptom  Chemo-induced peripheral neuropathy: Reduce the dosage of cycle 9 Taxol  Depression/anxiety/panic attacks: Sent a prescription for Wellbutrin. Monitoring closely for toxicities Patient will finish her chemotherapy on 01/20/2019. I ordered a breast MRI to be done on 01/21/2019. I requested Dr. [name] Dr. [place] Planas to see her after and plan her surgeries. She will be present in the tumor conference after the MRI.   No orders of the defined types were placed in this encounter.  The patient has a good understanding of the overall plan. she agrees with it. she will call with any problems that may develop before the next visit here.  [name], MD 01/06/2019  Julious Oka Dorshimer am acting as scribe for Dr. [name].  I have reviewed the above documentation for accuracy and completeness, and I agree with the above.

## 2019-01-06 ENCOUNTER — Inpatient Hospital Stay: Payer: BC Managed Care – PPO

## 2019-01-06 ENCOUNTER — Other Ambulatory Visit: Payer: Self-pay

## 2019-01-06 ENCOUNTER — Other Ambulatory Visit: Payer: Self-pay | Admitting: *Deleted

## 2019-01-06 ENCOUNTER — Inpatient Hospital Stay: Payer: BC Managed Care – PPO | Admitting: Hematology and Oncology

## 2019-01-06 ENCOUNTER — Inpatient Hospital Stay: Payer: BC Managed Care – PPO | Attending: Hematology and Oncology

## 2019-01-06 DIAGNOSIS — Z171 Estrogen receptor negative status [ER-]: Secondary | ICD-10-CM

## 2019-01-06 DIAGNOSIS — T451X5A Adverse effect of antineoplastic and immunosuppressive drugs, initial encounter: Secondary | ICD-10-CM | POA: Diagnosis not present

## 2019-01-06 DIAGNOSIS — G620 Drug-induced polyneuropathy: Secondary | ICD-10-CM | POA: Insufficient documentation

## 2019-01-06 DIAGNOSIS — Z5112 Encounter for antineoplastic immunotherapy: Secondary | ICD-10-CM | POA: Diagnosis not present

## 2019-01-06 DIAGNOSIS — Z5111 Encounter for antineoplastic chemotherapy: Secondary | ICD-10-CM | POA: Diagnosis not present

## 2019-01-06 DIAGNOSIS — C50411 Malignant neoplasm of upper-outer quadrant of right female breast: Secondary | ICD-10-CM

## 2019-01-06 DIAGNOSIS — Z95828 Presence of other vascular implants and grafts: Secondary | ICD-10-CM

## 2019-01-06 DIAGNOSIS — Z79899 Other long term (current) drug therapy: Secondary | ICD-10-CM | POA: Diagnosis not present

## 2019-01-06 LAB — CMP (CANCER CENTER ONLY)
ALT: 12 U/L (ref 0–44)
AST: 11 U/L — ABNORMAL LOW (ref 15–41)
Albumin: 4.1 g/dL (ref 3.5–5.0)
Alkaline Phosphatase: 59 U/L (ref 38–126)
Anion gap: 9 (ref 5–15)
BUN: 13 mg/dL (ref 6–20)
CO2: 26 mmol/L (ref 22–32)
Calcium: 9.1 mg/dL (ref 8.9–10.3)
Chloride: 107 mmol/L (ref 98–111)
Creatinine: 0.79 mg/dL (ref 0.44–1.00)
GFR, Est AFR Am: >60 mL/min (ref >60)
GFR, Estimated: >60 mL/min (ref >60)
Glucose, Bld: 97 mg/dL (ref 70–99)
Potassium: 4.6 mmol/L (ref 3.5–5.1)
Sodium: 142 mmol/L (ref 135–145)
Total Bilirubin: 0.3 mg/dL (ref 0.3–1.2)
Total Protein: 6.9 g/dL (ref 6.5–8.1)

## 2019-01-06 LAB — CBC WITH DIFFERENTIAL (CANCER CENTER ONLY)
Abs Immature Granulocytes: 0.01 10*3/uL (ref 0.00–0.07)
Basophils Absolute: 0 10*3/uL (ref 0.0–0.1)
Basophils Relative: 1 %
Eosinophils Absolute: 0.1 10*3/uL (ref 0.0–0.5)
Eosinophils Relative: 2 %
HCT: 35.8 % — ABNORMAL LOW (ref 36.0–46.0)
Hemoglobin: 11.9 g/dL — ABNORMAL LOW (ref 12.0–15.0)
Immature Granulocytes: 0 %
Lymphocytes Relative: 25 %
Lymphs Abs: 1.2 10*3/uL (ref 0.7–4.0)
MCH: 30.9 pg (ref 26.0–34.0)
MCHC: 33.2 g/dL (ref 30.0–36.0)
MCV: 93 fL (ref 80.0–100.0)
Monocytes Absolute: 0.2 10*3/uL (ref 0.1–1.0)
Monocytes Relative: 5 %
Neutro Abs: 3.3 10*3/uL (ref 1.7–7.7)
Neutrophils Relative %: 67 %
Platelet Count: 233 10*3/uL (ref 150–400)
RBC: 3.85 MIL/uL — ABNORMAL LOW (ref 3.87–5.11)
RDW: 13.5 % (ref 11.5–15.5)
WBC Count: 4.8 10*3/uL (ref 4.0–10.5)
nRBC: 0 % (ref 0.0–0.2)

## 2019-01-06 MED ORDER — FAMOTIDINE IN NACL 20-0.9 MG/50ML-% IV SOLN
20.0000 mg | Freq: Once | INTRAVENOUS | Status: AC
  Administered 2019-01-06: 20 mg via INTRAVENOUS

## 2019-01-06 MED ORDER — HEPARIN SOD (PORK) LOCK FLUSH 100 UNIT/ML IV SOLN
500.0000 [IU] | Freq: Once | INTRAVENOUS | Status: AC | PRN
  Administered 2019-01-06: 500 [IU]
  Filled 2019-01-06: qty 5

## 2019-01-06 MED ORDER — DEXAMETHASONE SODIUM PHOSPHATE 10 MG/ML IJ SOLN
10.0000 mg | Freq: Once | INTRAMUSCULAR | Status: AC
  Administered 2019-01-06: 12:00:00 10 mg via INTRAVENOUS

## 2019-01-06 MED ORDER — SODIUM CHLORIDE 0.9 % IV SOLN
Freq: Once | INTRAVENOUS | Status: AC
  Administered 2019-01-06: 12:00:00 via INTRAVENOUS
  Filled 2019-01-06: qty 250

## 2019-01-06 MED ORDER — ACETAMINOPHEN 325 MG PO TABS
650.0000 mg | ORAL_TABLET | Freq: Once | ORAL | Status: AC
  Administered 2019-01-06: 650 mg via ORAL

## 2019-01-06 MED ORDER — DEXAMETHASONE SODIUM PHOSPHATE 10 MG/ML IJ SOLN
INTRAMUSCULAR | Status: AC
  Filled 2019-01-06: qty 1

## 2019-01-06 MED ORDER — DIPHENHYDRAMINE HCL 50 MG/ML IJ SOLN
INTRAMUSCULAR | Status: AC
  Filled 2019-01-06: qty 1

## 2019-01-06 MED ORDER — FAMOTIDINE IN NACL 20-0.9 MG/50ML-% IV SOLN
INTRAVENOUS | Status: AC
  Filled 2019-01-06: qty 50

## 2019-01-06 MED ORDER — SODIUM CHLORIDE 0.9% FLUSH
10.0000 mL | Freq: Once | INTRAVENOUS | Status: AC
  Administered 2019-01-06: 10 mL
  Filled 2019-01-06: qty 10

## 2019-01-06 MED ORDER — TRASTUZUMAB-DKST CHEMO 150 MG IV SOLR
150.0000 mg | Freq: Once | INTRAVENOUS | Status: AC
  Administered 2019-01-06: 150 mg via INTRAVENOUS
  Filled 2019-01-06: qty 7.14

## 2019-01-06 MED ORDER — DIPHENHYDRAMINE HCL 50 MG/ML IJ SOLN
25.0000 mg | Freq: Once | INTRAMUSCULAR | Status: AC
  Administered 2019-01-06: 25 mg via INTRAVENOUS

## 2019-01-06 MED ORDER — ACETAMINOPHEN 325 MG PO TABS
ORAL_TABLET | ORAL | Status: AC
  Filled 2019-01-06: qty 2

## 2019-01-06 MED ORDER — SODIUM CHLORIDE 0.9 % IV SOLN
65.0000 mg/m2 | Freq: Once | INTRAVENOUS | Status: AC
  Administered 2019-01-06: 120 mg via INTRAVENOUS
  Filled 2019-01-06: qty 20

## 2019-01-06 MED ORDER — SODIUM CHLORIDE 0.9% FLUSH
10.0000 mL | INTRAVENOUS | Status: DC | PRN
  Administered 2019-01-06: 10 mL
  Filled 2019-01-06: qty 10

## 2019-01-06 NOTE — Patient Instructions (Signed)
[facility] Discharge Instructions for Patients Receiving Chemotherapy  Today you received the following chemotherapy agents: Trastuzumab, Taxol  To help prevent nausea and vomiting after your treatment, we encourage you to take your nausea medication as directed.   If you develop nausea and vomiting that is not controlled by your nausea medication, call the clinic.   BELOW ARE SYMPTOMS THAT SHOULD BE REPORTED IMMEDIATELY:  *FEVER GREATER THAN 100.5 F  *CHILLS WITH OR WITHOUT FEVER  NAUSEA AND VOMITING THAT IS NOT CONTROLLED WITH YOUR NAUSEA MEDICATION  *UNUSUAL SHORTNESS OF BREATH  *UNUSUAL BRUISING OR BLEEDING  TENDERNESS IN MOUTH AND THROAT WITH OR WITHOUT PRESENCE OF ULCERS  *URINARY PROBLEMS  *BOWEL PROBLEMS  UNUSUAL RASH Items with * indicate a potential emergency and should be followed up as soon as possible.  Feel free to call the clinic should you have any questions or concerns. The clinic phone number is [phone].  Please show the CHEMO ALERT CARD at check-in to the Emergency Department and triage nurse.   

## 2019-01-13 ENCOUNTER — Inpatient Hospital Stay: Payer: BC Managed Care – PPO

## 2019-01-13 ENCOUNTER — Other Ambulatory Visit: Payer: Self-pay

## 2019-01-13 ENCOUNTER — Other Ambulatory Visit: Payer: Self-pay | Admitting: *Deleted

## 2019-01-13 VITALS — BP 116/85 | HR 85 | Temp 99.1°F | Resp 16

## 2019-01-13 DIAGNOSIS — Z171 Estrogen receptor negative status [ER-]: Secondary | ICD-10-CM

## 2019-01-13 DIAGNOSIS — C50411 Malignant neoplasm of upper-outer quadrant of right female breast: Secondary | ICD-10-CM

## 2019-01-13 DIAGNOSIS — Z5111 Encounter for antineoplastic chemotherapy: Secondary | ICD-10-CM | POA: Diagnosis not present

## 2019-01-13 DIAGNOSIS — G620 Drug-induced polyneuropathy: Secondary | ICD-10-CM | POA: Diagnosis not present

## 2019-01-13 DIAGNOSIS — Z79899 Other long term (current) drug therapy: Secondary | ICD-10-CM | POA: Diagnosis not present

## 2019-01-13 DIAGNOSIS — Z5112 Encounter for antineoplastic immunotherapy: Secondary | ICD-10-CM | POA: Diagnosis not present

## 2019-01-13 DIAGNOSIS — Z95828 Presence of other vascular implants and grafts: Secondary | ICD-10-CM

## 2019-01-13 LAB — CMP (CANCER CENTER ONLY)
ALT: 10 U/L (ref 0–44)
AST: 11 U/L — ABNORMAL LOW (ref 15–41)
Albumin: 3.9 g/dL (ref 3.5–5.0)
Alkaline Phosphatase: 68 U/L (ref 38–126)
Anion gap: 9 (ref 5–15)
BUN: 10 mg/dL (ref 6–20)
CO2: 27 mmol/L (ref 22–32)
Calcium: 9 mg/dL (ref 8.9–10.3)
Chloride: 108 mmol/L (ref 98–111)
Creatinine: 0.8 mg/dL (ref 0.44–1.00)
GFR, Est AFR Am: >60 mL/min (ref >60)
GFR, Estimated: >60 mL/min (ref >60)
Glucose, Bld: 105 mg/dL — ABNORMAL HIGH (ref 70–99)
Potassium: 4.2 mmol/L (ref 3.5–5.1)
Sodium: 144 mmol/L (ref 135–145)
Total Bilirubin: <0.2 mg/dL — ABNORMAL LOW (ref 0.3–1.2)
Total Protein: 6.6 g/dL (ref 6.5–8.1)

## 2019-01-13 LAB — CBC WITH DIFFERENTIAL (CANCER CENTER ONLY)
Abs Immature Granulocytes: 0.03 10*3/uL (ref 0.00–0.07)
Basophils Absolute: 0 10*3/uL (ref 0.0–0.1)
Basophils Relative: 0 %
Eosinophils Absolute: 0.1 10*3/uL (ref 0.0–0.5)
Eosinophils Relative: 2 %
HCT: 33.2 % — ABNORMAL LOW (ref 36.0–46.0)
Hemoglobin: 11.4 g/dL — ABNORMAL LOW (ref 12.0–15.0)
Immature Granulocytes: 1 %
Lymphocytes Relative: 21 %
Lymphs Abs: 1.2 10*3/uL (ref 0.7–4.0)
MCH: 31.8 pg (ref 26.0–34.0)
MCHC: 34.3 g/dL (ref 30.0–36.0)
MCV: 92.7 fL (ref 80.0–100.0)
Monocytes Absolute: 0.3 10*3/uL (ref 0.1–1.0)
Monocytes Relative: 5 %
Neutro Abs: 3.9 10*3/uL (ref 1.7–7.7)
Neutrophils Relative %: 71 %
Platelet Count: 214 10*3/uL (ref 150–400)
RBC: 3.58 MIL/uL — ABNORMAL LOW (ref 3.87–5.11)
RDW: 13.5 % (ref 11.5–15.5)
WBC Count: 5.5 10*3/uL (ref 4.0–10.5)
nRBC: 0 % (ref 0.0–0.2)

## 2019-01-13 MED ORDER — TRASTUZUMAB-DKST CHEMO 150 MG IV SOLR
150.0000 mg | Freq: Once | INTRAVENOUS | Status: AC
  Administered 2019-01-13: 150 mg via INTRAVENOUS
  Filled 2019-01-13: qty 7.14

## 2019-01-13 MED ORDER — ACETAMINOPHEN 325 MG PO TABS
ORAL_TABLET | ORAL | Status: AC
  Filled 2019-01-13: qty 2

## 2019-01-13 MED ORDER — FAMOTIDINE IN NACL 20-0.9 MG/50ML-% IV SOLN
INTRAVENOUS | Status: AC
  Filled 2019-01-13: qty 50

## 2019-01-13 MED ORDER — DEXAMETHASONE SODIUM PHOSPHATE 10 MG/ML IJ SOLN
INTRAMUSCULAR | Status: AC
  Filled 2019-01-13: qty 1

## 2019-01-13 MED ORDER — ACETAMINOPHEN 325 MG PO TABS
650.0000 mg | ORAL_TABLET | Freq: Once | ORAL | Status: AC
  Administered 2019-01-13: 650 mg via ORAL

## 2019-01-13 MED ORDER — SODIUM CHLORIDE 0.9 % IV SOLN
Freq: Once | INTRAVENOUS | Status: AC
  Administered 2019-01-13: 10:00:00 via INTRAVENOUS
  Filled 2019-01-13: qty 250

## 2019-01-13 MED ORDER — SODIUM CHLORIDE 0.9% FLUSH
10.0000 mL | INTRAVENOUS | Status: DC | PRN
  Administered 2019-01-13: 10 mL
  Filled 2019-01-13: qty 10

## 2019-01-13 MED ORDER — FAMOTIDINE IN NACL 20-0.9 MG/50ML-% IV SOLN
20.0000 mg | Freq: Once | INTRAVENOUS | Status: AC
  Administered 2019-01-13: 20 mg via INTRAVENOUS

## 2019-01-13 MED ORDER — LORAZEPAM 0.5 MG PO TABS: 0.5000 mg | ORAL_TABLET | Freq: Every evening | ORAL | 0 refills | Status: DC | PRN

## 2019-01-13 MED ORDER — HEPARIN SOD (PORK) LOCK FLUSH 100 UNIT/ML IV SOLN
500.0000 [IU] | Freq: Once | INTRAVENOUS | Status: AC | PRN
  Administered 2019-01-13: 500 [IU]
  Filled 2019-01-13: qty 5

## 2019-01-13 MED ORDER — SODIUM CHLORIDE 0.9 % IV SOLN
65.0000 mg/m2 | Freq: Once | INTRAVENOUS | Status: AC
  Administered 2019-01-13: 120 mg via INTRAVENOUS
  Filled 2019-01-13: qty 20

## 2019-01-13 MED ORDER — DEXAMETHASONE SODIUM PHOSPHATE 10 MG/ML IJ SOLN
10.0000 mg | Freq: Once | INTRAMUSCULAR | Status: AC
  Administered 2019-01-13: 10 mg via INTRAVENOUS

## 2019-01-13 MED ORDER — DIPHENHYDRAMINE HCL 50 MG/ML IJ SOLN
25.0000 mg | Freq: Once | INTRAMUSCULAR | Status: AC
  Administered 2019-01-13: 25 mg via INTRAVENOUS

## 2019-01-13 MED ORDER — DIPHENHYDRAMINE HCL 50 MG/ML IJ SOLN
INTRAMUSCULAR | Status: AC
  Filled 2019-01-13: qty 1

## 2019-01-13 MED ORDER — SODIUM CHLORIDE 0.9% FLUSH
10.0000 mL | Freq: Once | INTRAVENOUS | Status: AC
  Administered 2019-01-13: 10 mL
  Filled 2019-01-13: qty 10

## 2019-01-13 NOTE — Patient Instructions (Signed)

## 2019-01-13 NOTE — Patient Instructions (Signed)
[facility] Discharge Instructions for Patients Receiving Chemotherapy  Today you received the following chemotherapy agents: Trastuzumab, Taxol  To help prevent nausea and vomiting after your treatment, we encourage you to take your nausea medication as directed.   If you develop nausea and vomiting that is not controlled by your nausea medication, call the clinic.   BELOW ARE SYMPTOMS THAT SHOULD BE REPORTED IMMEDIATELY:  *FEVER GREATER THAN 100.5 F  *CHILLS WITH OR WITHOUT FEVER  NAUSEA AND VOMITING THAT IS NOT CONTROLLED WITH YOUR NAUSEA MEDICATION  *UNUSUAL SHORTNESS OF BREATH  *UNUSUAL BRUISING OR BLEEDING  TENDERNESS IN MOUTH AND THROAT WITH OR WITHOUT PRESENCE OF ULCERS  *URINARY PROBLEMS  *BOWEL PROBLEMS  UNUSUAL RASH Items with * indicate a potential emergency and should be followed up as soon as possible.  Feel free to call the clinic should you have any questions or concerns. The clinic phone number is [phone].  Please show the CHEMO ALERT CARD at check-in to the Emergency Department and triage nurse.   

## 2019-01-14 DIAGNOSIS — C50411 Malignant neoplasm of upper-outer quadrant of right female breast: Secondary | ICD-10-CM | POA: Diagnosis not present

## 2019-01-17 ENCOUNTER — Telehealth: Payer: Self-pay

## 2019-01-17 NOTE — Telephone Encounter (Signed)
Called to schedule the patient's 3 month UPBEAT visit. Pt says its ok to do labs on Thursday with her labs for chemo. Patient requested to come in 10/08 to complete the rest of the assessments and the cardiac MRI. Told patient this RN would call her back once the cardiac MRI is scheduled. Johney Maine RN, BSN Clinical Research  01/17/19 1:07 PM

## 2019-01-18 ENCOUNTER — Other Ambulatory Visit (HOSPITAL_COMMUNITY): Payer: Self-pay | Admitting: Hematology and Oncology

## 2019-01-18 ENCOUNTER — Telehealth: Payer: Self-pay

## 2019-01-18 DIAGNOSIS — Z006 Encounter for examination for normal comparison and control in clinical research program: Secondary | ICD-10-CM

## 2019-01-18 NOTE — Telephone Encounter (Signed)
Called patient to let her know that this RN scheduled her UPBEAT MRI for 10am on 02/10/19. Patient stated that worked. After cardiac MRI, pt will come to research office to complete the rest of her 3 month assessments. Johney Maine RN, BSN Clinical Research  01/18/19 10:31 AM

## 2019-01-19 NOTE — Progress Notes (Signed)
Patient Care Team: Redmon, Barth Kirks, PA-C as PCP - General (Nurse Practitioner)  DIAGNOSIS:    ICD-10-CM   1. Malignant neoplasm of upper-outer quadrant of right breast in female, estrogen receptor negative ([place])  C50.411    Z17.1     SUMMARY OF ONCOLOGIC HISTORY: Oncology History  Malignant neoplasm of upper-outer quadrant of right breast in female, estrogen receptor negative ([place])  10/11/2018 Initial Diagnosis   Patient palpated a right breast mass, mammogram revealed 1.6 cm mass, biopsy revealed grade 3 invasive ductal carcinoma ER 0%, PR 0%, Ki-67 20%, HER-2 3+ positive, T1CN0 stage IA   10/19/2018 Cancer Staging   Staging form: Breast, AJCC 8th Edition - Clinical stage from 10/19/2018: Stage IA (cT1c, cN0, cM0, G3, ER-, PR-, HER2+) - Signed by [name], MD on 10/19/2018   10/29/2018 -  Chemotherapy   The patient had trastuzumab (HERCEPTIN) 300 mg in sodium chloride 0.9 % 250 mL chemo infusion, 294 mg, Intravenous,  Once, 2 of 2 cycles Administration: 300 mg (10/29/2018), 150 mg (11/03/2018), 150 mg (12/02/2018), 150 mg (11/12/2018), 150 mg (11/25/2018), 150 mg (12/09/2018) PACLitaxel (TAXOL) 150 mg in sodium chloride 0.9 % 250 mL chemo infusion (</= 44m/m2), 80 mg/m2 = 150 mg, Intravenous,  Once, 3 of 3 cycles Dose modification: 65 mg/m2 (original dose 80 mg/m2, Cycle 3, Reason: Dose not tolerated) Administration: 150 mg (10/29/2018), 150 mg (11/03/2018), 150 mg (12/02/2018), 150 mg (11/12/2018), 150 mg (11/25/2018), 150 mg (12/09/2018), 150 mg (12/16/2018), 150 mg (12/23/2018), 120 mg (12/30/2018), 120 mg (01/06/2019), 120 mg (01/13/2019) trastuzumab-dkst (OGIVRI) 150 mg in sodium chloride 0.9 % 250 mL chemo infusion, 150 mg (100 % of original dose 150 mg), Intravenous,  Once, 2 of 2 cycles Dose modification: 150 mg (original dose 150 mg, Cycle 2, Reason: Other (see comments), Comment: Biosimilar Conversion), 450 mg (original dose 150 mg, Cycle 3, Reason: Other (see comments), Comment: Biosimilar  Conversion) Administration: 150 mg (12/16/2018), 150 mg (12/23/2018), 150 mg (12/30/2018), 150 mg (01/06/2019), 150 mg (01/13/2019)  for chemotherapy treatment.    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  The Common Hereditary Gene Panel offered by [facility] includes sequencing and/or deletion duplication testing of the following 48 genes: APC, ATM, AXIN2, BARD1, BMPR1A, BRCA1, BRCA2, BRIP1, CDH1, CDK4, CDKN2A (p14ARF), CDKN2A (p16INK4a), CHEK2, CTNNA1, DICER1, EPCAM (Deletion/duplication testing only), GREM1 (promoter region deletion/duplication testing only), KIT, MEN1, MLH1, MSH2, MSH3, MSH6, MUTYH, NBN, NF1, NHTL1, PALB2, PDGFRA, PMS2, POLD1, POLE, PTEN, RAD50, RAD51C, RAD51D, RNF43, SDHB, SDHC, SDHD, SMAD4, SMARCA4. STK11, TP53, TSC1, TSC2, and VHL.  The following genes were evaluated for sequence changes only: SDHA and HOXB13 c.251G>A variant only.   Two VUS's also found - one in ATM c.133C>T and the other in POLE c.1280C>T.  These will not affect medical management. The report date is November 12, 2018.     CHIEF COMPLIANT: Cycle12Taxol Herceptin  INTERVAL HISTORY: Teresa BARCLIFTis a 38y.o. with above-mentioned history of right breast cancer currently on neoadjuvant chemotherapy with weekly Taxol and Herceptin.She is a participant in the UpBeat clinical trial.She presents to the clinic todayfor cycle12.  She denies neuropathy.  REVIEW OF SYSTEMS:   Constitutional: Denies fevers, chills or abnormal weight loss Eyes: Denies blurriness of vision Ears, nose, mouth, throat, and face: Denies mucositis or sore throat Respiratory: Denies cough, dyspnea or wheezes Cardiovascular: Denies palpitation, chest discomfort Gastrointestinal: Denies nausea, heartburn or change in bowel habits Skin: Denies abnormal skin rashes  Lymphatics: Denies new lymphadenopathy or easy bruising Neurological: Denies numbness, tingling or  new weaknesses Behavioral/Psych: Mood is stable, no new changes  Extremities: No lower extremity edema Breast: denies any pain or lumps or nodules in either breasts All other systems were reviewed with the patient and are negative.  I have reviewed the past medical history, past surgical history, social history and family history with the patient and they are unchanged from previous note.  ALLERGIES:  has No Known Allergies.  MEDICATIONS:  Current Outpatient Medications  Medication Sig Dispense Refill  . buPROPion (WELLBUTRIN XL) 150 MG 24 hr tablet Take 1 tablet (150 mg total) by mouth daily. 30 tablet 3  . lidocaine-prilocaine (EMLA) cream Apply to affected area once 30 g 3  . LORazepam (ATIVAN) 0.5 MG tablet Take 1 tablet (0.5 mg total) by mouth at bedtime as needed for sleep. 30 tablet 0  . ondansetron (ZOFRAN) 8 MG tablet Take 1 tablet (8 mg total) by mouth 2 (two) times daily as needed (Nausea or vomiting). (Patient taking differently: Take 8 mg by mouth as needed (Nausea or vomiting). ) 30 tablet 1  . polyethylene glycol-electrolytes (NULYTELY/GOLYTELY) 420 g solution Take 4,000 mLs by mouth as directed. 4000 mL 0   No current facility-administered medications for this visit.     PHYSICAL EXAMINATION: ECOG PERFORMANCE STATUS: 1 - Symptomatic but completely ambulatory  Vitals:   01/20/19 1024  BP: 132/90  Pulse: 76  Resp: 20  Temp: 98.3 F (36.8 C)  SpO2: 100%   Filed Weights   01/20/19 1024  Weight: 168 lb 9.6 oz (76.5 kg)    GENERAL: alert, no distress and comfortable SKIN: skin color, texture, turgor are normal, no rashes or significant lesions EYES: normal, Conjunctiva are pink and non-injected, sclera clear OROPHARYNX: no exudate, no erythema and lips, buccal mucosa, and tongue normal  NECK: supple, thyroid normal size, non-tender, without nodularity LYMPH: no palpable lymphadenopathy in the cervical, axillary or inguinal LUNGS: clear to auscultation and  percussion with normal breathing effort HEART: regular rate & rhythm and no murmurs and no lower extremity edema ABDOMEN: abdomen soft, non-tender and normal bowel sounds MUSCULOSKELETAL: no cyanosis of digits and no clubbing  NEURO: alert & oriented x 3 with fluent speech, no focal motor/sensory deficits EXTREMITIES: No lower extremity edema  LABORATORY DATA:  I have reviewed the data as listed CMP Latest Ref Rng & Units 01/13/2019 01/06/2019 12/30/2018  Glucose 70 - 99 mg/dL 105(H) 97 109(H)  BUN 6 - 20 mg/dL '10 13 14  ' Creatinine 0.44 - 1.00 mg/dL 0.80 0.79 0.78  Sodium 135 - 145 mmol/L 144 142 141  Potassium 3.5 - 5.1 mmol/L 4.2 4.6 4.2  Chloride 98 - 111 mmol/L 108 107 107  CO2 22 - 32 mmol/L '27 26 26  ' Calcium 8.9 - 10.3 mg/dL 9.0 9.1 9.3  Total Protein 6.5 - 8.1 g/dL 6.6 6.9 7.0  Total Bilirubin 0.3 - 1.2 mg/dL <0.2(L) 0.3 0.4  Alkaline Phos 38 - 126 U/L 68 59 59  AST 15 - 41 U/L 11(L) 11(L) 10(L)  ALT 0 - 44 U/L '10 12 12    ' Lab Results  Component Value Date   WBC 5.0 01/20/2019   HGB 12.4 01/20/2019   HCT 36.4 01/20/2019   MCV 93.8 01/20/2019   PLT 258 01/20/2019   NEUTROABS 3.3 01/20/2019    ASSESSMENT & PLAN:  Malignant neoplasm of upper-outer quadrant of right breast in female, estrogen receptor negative ([place]) 10/11/2018:Patient palpated a right  breast mass, mammogram revealed 1.6 cm mass, biopsy revealed grade 3 invasive ductal carcinoma ER 0%, PR 0%, Ki-67 20%, HER-2 3+ positive, T1CN0 stage IA  Treatment plan: 1. Neoadjuvant Taxol and Herceptin weekly x12. Followed by Herceptin maintenance therapy every 3 weeks to complete 1 year. 2.Breast conserving surgery with sentinel lymph node biopsy 3.Adjuvant radiation therapy Upbeat clinical trial: No adverse effects from participating in the trial Lynch syndrome positive on genetic testing (I discussed with her about aspirin 600 mg for prevention of colon cancer risk: Study published in Lancet dose)  ---------------------------------------------------------------------------------------------------------------------------------------- Current treatment: Cycle12Taxol Herceptin Echocardiogram: 10/22/2018: EF 55 to 60%  Chemo toxicities: Constipation She is using cold cap as well as icing her hands and feet. Fatigue is her biggest symptom  Chemo-induced peripheral neuropathy: Reduced the dosage of cycle 9 Taxol, currently stable  Depression/anxiety/panic attacks: On Wellbutrin. Breast MRI 01/21/2019 Patient has appointments to see Dr. [name] to discuss surgery.  She will be presented at the multidisciplinary breast tumor board after the breast MRI.  We will initiate Herceptin maintenance therapy.  I discussed with her about the subcu Herceptin but is coming down the line.  Because she hates having the port it may be a good option for her.  Return to clinic after surgery to discuss the pathology report.    No orders of the defined types were placed in this encounter.  The patient has a good understanding of the overall plan. she agrees with it. she will call with any problems that may develop before the next visit here.  [name], MD 01/20/2019  [name] am acting as scribe for Dr. [name].  I have reviewed the above documentation for accuracy and completeness, and I agree with the above.

## 2019-01-20 ENCOUNTER — Other Ambulatory Visit: Payer: Self-pay

## 2019-01-20 ENCOUNTER — Inpatient Hospital Stay: Payer: BC Managed Care – PPO

## 2019-01-20 ENCOUNTER — Telehealth: Payer: Self-pay | Admitting: *Deleted

## 2019-01-20 ENCOUNTER — Inpatient Hospital Stay: Payer: BC Managed Care – PPO | Admitting: Hematology and Oncology

## 2019-01-20 DIAGNOSIS — G620 Drug-induced polyneuropathy: Secondary | ICD-10-CM | POA: Diagnosis not present

## 2019-01-20 DIAGNOSIS — Z171 Estrogen receptor negative status [ER-]: Secondary | ICD-10-CM

## 2019-01-20 DIAGNOSIS — C50411 Malignant neoplasm of upper-outer quadrant of right female breast: Secondary | ICD-10-CM

## 2019-01-20 DIAGNOSIS — Z95828 Presence of other vascular implants and grafts: Secondary | ICD-10-CM

## 2019-01-20 DIAGNOSIS — Z5112 Encounter for antineoplastic immunotherapy: Secondary | ICD-10-CM | POA: Diagnosis not present

## 2019-01-20 DIAGNOSIS — Z5111 Encounter for antineoplastic chemotherapy: Secondary | ICD-10-CM | POA: Diagnosis not present

## 2019-01-20 DIAGNOSIS — Z79899 Other long term (current) drug therapy: Secondary | ICD-10-CM | POA: Diagnosis not present

## 2019-01-20 LAB — CMP (CANCER CENTER ONLY)
ALT: 13 U/L (ref 0–44)
AST: 11 U/L — ABNORMAL LOW (ref 15–41)
Albumin: 4.2 g/dL (ref 3.5–5.0)
Alkaline Phosphatase: 68 U/L (ref 38–126)
Anion gap: 8 (ref 5–15)
BUN: 14 mg/dL (ref 6–20)
CO2: 26 mmol/L (ref 22–32)
Calcium: 9.4 mg/dL (ref 8.9–10.3)
Chloride: 107 mmol/L (ref 98–111)
Creatinine: 0.81 mg/dL (ref 0.44–1.00)
GFR, Est AFR Am: >60 mL/min (ref >60)
GFR, Estimated: >60 mL/min (ref >60)
Glucose, Bld: 99 mg/dL (ref 70–99)
Potassium: 4.3 mmol/L (ref 3.5–5.1)
Sodium: 141 mmol/L (ref 135–145)
Total Bilirubin: 0.3 mg/dL (ref 0.3–1.2)
Total Protein: 7 g/dL (ref 6.5–8.1)

## 2019-01-20 LAB — CBC WITH DIFFERENTIAL (CANCER CENTER ONLY)
Abs Immature Granulocytes: 0.02 10*3/uL (ref 0.00–0.07)
Basophils Absolute: 0 10*3/uL (ref 0.0–0.1)
Basophils Relative: 0 %
Eosinophils Absolute: 0.1 10*3/uL (ref 0.0–0.5)
Eosinophils Relative: 1 %
HCT: 36.4 % (ref 36.0–46.0)
Hemoglobin: 12.4 g/dL (ref 12.0–15.0)
Immature Granulocytes: 0 %
Lymphocytes Relative: 28 %
Lymphs Abs: 1.4 10*3/uL (ref 0.7–4.0)
MCH: 32 pg (ref 26.0–34.0)
MCHC: 34.1 g/dL (ref 30.0–36.0)
MCV: 93.8 fL (ref 80.0–100.0)
Monocytes Absolute: 0.3 10*3/uL (ref 0.1–1.0)
Monocytes Relative: 5 %
Neutro Abs: 3.3 10*3/uL (ref 1.7–7.7)
Neutrophils Relative %: 66 %
Platelet Count: 258 10*3/uL (ref 150–400)
RBC: 3.88 MIL/uL (ref 3.87–5.11)
RDW: 13.4 % (ref 11.5–15.5)
WBC Count: 5 10*3/uL (ref 4.0–10.5)
nRBC: 0 % (ref 0.0–0.2)

## 2019-01-20 LAB — RESEARCH LABS

## 2019-01-20 MED ORDER — DEXAMETHASONE SODIUM PHOSPHATE 10 MG/ML IJ SOLN
INTRAMUSCULAR | Status: AC
  Filled 2019-01-20: qty 1

## 2019-01-20 MED ORDER — DEXAMETHASONE SODIUM PHOSPHATE 10 MG/ML IJ SOLN
10.0000 mg | Freq: Once | INTRAMUSCULAR | Status: AC
  Administered 2019-01-20: 12:00:00 10 mg via INTRAVENOUS

## 2019-01-20 MED ORDER — SODIUM CHLORIDE 0.9% FLUSH
10.0000 mL | Freq: Once | INTRAVENOUS | Status: AC
  Administered 2019-01-20: 10 mL
  Filled 2019-01-20: qty 10

## 2019-01-20 MED ORDER — SODIUM CHLORIDE 0.9 % IV SOLN
65.0000 mg/m2 | Freq: Once | INTRAVENOUS | Status: AC
  Administered 2019-01-20: 120 mg via INTRAVENOUS
  Filled 2019-01-20: qty 20

## 2019-01-20 MED ORDER — ACETAMINOPHEN 325 MG PO TABS
ORAL_TABLET | ORAL | Status: AC
  Filled 2019-01-20: qty 2

## 2019-01-20 MED ORDER — FAMOTIDINE IN NACL 20-0.9 MG/50ML-% IV SOLN
INTRAVENOUS | Status: AC
  Filled 2019-01-20: qty 50

## 2019-01-20 MED ORDER — DIPHENHYDRAMINE HCL 50 MG/ML IJ SOLN
INTRAMUSCULAR | Status: AC
  Filled 2019-01-20: qty 1

## 2019-01-20 MED ORDER — ACETAMINOPHEN 325 MG PO TABS
650.0000 mg | ORAL_TABLET | Freq: Once | ORAL | Status: AC
  Administered 2019-01-20: 650 mg via ORAL

## 2019-01-20 MED ORDER — TRASTUZUMAB-DKST CHEMO 150 MG IV SOLR
450.0000 mg | Freq: Once | INTRAVENOUS | Status: AC
  Administered 2019-01-20: 450 mg via INTRAVENOUS
  Filled 2019-01-20: qty 21.43

## 2019-01-20 MED ORDER — SODIUM CHLORIDE 0.9% FLUSH
10.0000 mL | INTRAVENOUS | Status: DC | PRN
  Administered 2019-01-20: 10 mL
  Filled 2019-01-20: qty 10

## 2019-01-20 MED ORDER — SODIUM CHLORIDE 0.9 % IV SOLN
Freq: Once | INTRAVENOUS | Status: AC
  Administered 2019-01-20: 11:00:00 via INTRAVENOUS
  Filled 2019-01-20: qty 250

## 2019-01-20 MED ORDER — DIPHENHYDRAMINE HCL 50 MG/ML IJ SOLN
25.0000 mg | Freq: Once | INTRAMUSCULAR | Status: AC
  Administered 2019-01-20: 25 mg via INTRAVENOUS

## 2019-01-20 MED ORDER — FAMOTIDINE IN NACL 20-0.9 MG/50ML-% IV SOLN
20.0000 mg | Freq: Once | INTRAVENOUS | Status: AC
  Administered 2019-01-20: 20 mg via INTRAVENOUS

## 2019-01-20 MED ORDER — HEPARIN SOD (PORK) LOCK FLUSH 100 UNIT/ML IV SOLN
500.0000 [IU] | Freq: Once | INTRAVENOUS | Status: AC | PRN
  Administered 2019-01-20: 500 [IU]
  Filled 2019-01-20: qty 5

## 2019-01-20 NOTE — Assessment & Plan Note (Signed)
10/11/2018:Patient palpated a right breast mass, mammogram revealed 1.6 cm mass, biopsy revealed grade 3 invasive ductal carcinoma ER 0%, PR 0%, Ki-67 20%, HER-2 3+ positive, T1CN0 stage IA  Treatment plan: 1. Neoadjuvant Taxol and Herceptin weekly x12. Followed by Herceptin maintenance therapy every 3 weeks to complete 1 year. 2.Breast conserving surgery with sentinel lymph node biopsy 3.Adjuvant radiation therapy Upbeat clinical trial: No adverse effects from participating in the trial Lynch syndrome positive on genetic testing (I discussed with her about aspirin 600 mg for prevention of colon cancer risk: Study published in Lancet dose) ---------------------------------------------------------------------------------------------------------------------------------------- Current treatment: Cycle12Taxol Herceptin Echocardiogram: 10/22/2018: EF 55 to 60%  Chemo toxicities: Constipation She is using cold cap as well as icing her hands and feet. Fatigue is her biggest symptom  Chemo-induced peripheral neuropathy: Reduced the dosage of cycle 9 Taxol, currently stable  Depression/anxiety/panic attacks: On Wellbutrin. Breast MRI 01/21/2019 Patient has appointments to see Dr. [name] to discuss surgery.  She will be presented at the multidisciplinary breast tumor board after the breast MRI.  Return to clinic after surgery to discuss the pathology report.

## 2019-01-20 NOTE — Telephone Encounter (Signed)
Left vm congratulating pt on completing chemo tx today. Contact information provided for questions or needs.

## 2019-01-20 NOTE — Patient Instructions (Signed)

## 2019-01-21 ENCOUNTER — Ambulatory Visit
Admission: RE | Admit: 2019-01-21 | Discharge: 2019-01-21 | Disposition: A | Discharge status: Home / Self Care | Payer: BC Managed Care – PPO | Source: Ambulatory Visit | Attending: Hematology and Oncology | Admitting: Hematology and Oncology

## 2019-01-21 ENCOUNTER — Telehealth: Payer: Self-pay | Admitting: Hematology and Oncology

## 2019-01-21 DIAGNOSIS — C50411 Malignant neoplasm of upper-outer quadrant of right female breast: Secondary | ICD-10-CM

## 2019-01-21 DIAGNOSIS — Z171 Estrogen receptor negative status [ER-]: Secondary | ICD-10-CM

## 2019-01-21 DIAGNOSIS — D0511 Intraductal carcinoma in situ of right breast: Secondary | ICD-10-CM | POA: Diagnosis not present

## 2019-01-21 DIAGNOSIS — R922 Inconclusive mammogram: Secondary | ICD-10-CM | POA: Diagnosis not present

## 2019-01-21 MED ORDER — GADOBUTROL 1 MMOL/ML IV SOLN
7.0000 mL | Freq: Once | INTRAVENOUS | Status: AC | PRN
  Administered 2019-01-21: 7 mL via INTRAVENOUS

## 2019-01-21 NOTE — Telephone Encounter (Signed)
I talk with patient regarding schedule  

## 2019-01-27 ENCOUNTER — Encounter: Payer: Self-pay | Admitting: Gastroenterology

## 2019-01-27 ENCOUNTER — Encounter: Payer: Self-pay | Admitting: Hematology and Oncology

## 2019-02-02 ENCOUNTER — Encounter: Payer: Self-pay | Admitting: Hematology and Oncology

## 2019-02-02 ENCOUNTER — Telehealth: Payer: Self-pay

## 2019-02-02 DIAGNOSIS — Z6830 Body mass index (BMI) 30.0-30.9, adult: Secondary | ICD-10-CM | POA: Diagnosis not present

## 2019-02-02 DIAGNOSIS — Z1509 Genetic susceptibility to other malignant neoplasm: Secondary | ICD-10-CM | POA: Diagnosis not present

## 2019-02-02 NOTE — Telephone Encounter (Signed)
RN returned call. Pt requesting genetic testing results showing confirmation for Lynch Syndrome be faxed to Dr. Jerrilyn Cairo office.    RN faxed results successfully to [phone].  Pt aware via voicemail.

## 2019-02-03 ENCOUNTER — Telehealth: Payer: Self-pay

## 2019-02-03 NOTE — Telephone Encounter (Signed)
Covid-19 screening questions   Do you now or have you had a fever in the last 14 days?  Do you have any respiratory symptoms of shortness of breath or cough now or in the last 14 days?  Do you have any family members or close contacts with diagnosed or suspected Covid-19 in the past 14 days?  Have you been tested for Covid-19 and found to be positive?       

## 2019-02-03 NOTE — Telephone Encounter (Signed)
Pt responded "no" to all screening questions °

## 2019-02-04 ENCOUNTER — Encounter: Payer: Self-pay | Admitting: Gastroenterology

## 2019-02-04 ENCOUNTER — Ambulatory Visit (AMBULATORY_SURGERY_CENTER): Payer: BC Managed Care – PPO | Admitting: Gastroenterology

## 2019-02-04 ENCOUNTER — Other Ambulatory Visit: Payer: Self-pay

## 2019-02-04 VITALS — BP 153/73 | HR 80 | Temp 98.5°F | Resp 20 | Ht 63.25 in | Wt 172.0 lb

## 2019-02-04 DIAGNOSIS — K449 Diaphragmatic hernia without obstruction or gangrene: Secondary | ICD-10-CM | POA: Diagnosis not present

## 2019-02-04 DIAGNOSIS — K2950 Unspecified chronic gastritis without bleeding: Secondary | ICD-10-CM | POA: Diagnosis not present

## 2019-02-04 DIAGNOSIS — Z1509 Genetic susceptibility to other malignant neoplasm: Secondary | ICD-10-CM

## 2019-02-04 DIAGNOSIS — Z1211 Encounter for screening for malignant neoplasm of colon: Secondary | ICD-10-CM

## 2019-02-04 DIAGNOSIS — K2970 Gastritis, unspecified, without bleeding: Secondary | ICD-10-CM

## 2019-02-04 MED ORDER — SODIUM CHLORIDE 0.9 % IV SOLN: 500.0000 mL | INTRAVENOUS | Status: DC

## 2019-02-04 NOTE — Progress Notes (Signed)
Report to PACU, RN, vss, BBS= Clear.  

## 2019-02-04 NOTE — Patient Instructions (Signed)
YOU HAD AN ENDOSCOPIC PROCEDURE TODAY AT THE [facility]:   Refer to the procedure report that was given to you for any specific questions about what was found during the examination.  If the procedure report does not answer your questions, please call your gastroenterologist to clarify.  If you requested that your care partner not be given the details of your procedure findings, then the procedure report has been included in a sealed envelope for you to review at your convenience later.  YOU SHOULD EXPECT: Some feelings of bloating in the abdomen. Passage of more gas than usual.  Walking can help get rid of the air that was put into your GI tract during the procedure and reduce the bloating. If you had a lower endoscopy (such as a colonoscopy or flexible sigmoidoscopy) you may notice spotting of blood in your stool or on the toilet paper. If you underwent a bowel prep for your procedure, you may not have a normal bowel movement for a few days.  Please Note:  You might notice some irritation and congestion in your nose or some drainage.  This is from the oxygen used during your procedure.  There is no need for concern and it should clear up in a day or so.  SYMPTOMS TO REPORT IMMEDIATELY:   Following lower endoscopy (colonoscopy or flexible sigmoidoscopy):  Excessive amounts of blood in the stool  Significant tenderness or worsening of abdominal pains  Swelling of the abdomen that is new, acute  Fever of 100F or higher   Following upper endoscopy (EGD)  Vomiting of blood or coffee ground material  New chest pain or pain under the shoulder blades  Painful or persistently difficult swallowing  New shortness of breath  Fever of 100F or higher  Black, tarry-looking stools  For urgent or emergent issues, a gastroenterologist can be reached at any hour by calling [phone].   DIET:  We do recommend a small meal at first, but then you may proceed to your regular diet.  Drink  plenty of fluids but you should avoid alcoholic beverages for 24 hours.  ACTIVITY:  You should plan to take it easy for the rest of today and you should NOT DRIVE or use heavy machinery until tomorrow (because of the sedation medicines used during the test).    FOLLOW UP: Our staff will call the number listed on your records 48-72 hours following your procedure to check on you and address any questions or concerns that you may have regarding the information given to you following your procedure. If we do not reach you, we will leave a message.  We will attempt to reach you two times.  During this call, we will ask if you have developed any symptoms of COVID 19. If you develop any symptoms (ie: fever, flu-like symptoms, shortness of breath, cough etc.) before then, please call [phone].  If you test positive for Covid 19 in the 2 weeks post procedure, please call and report this information to us.    If any biopsies were taken you will be contacted by phone or by letter within the next 1-3 weeks.  Please call us at [phone] if you have not heard about the biopsies in 3 weeks.    SIGNATURES/CONFIDENTIALITY: You and/or your care partner have signed paperwork which will be entered into your electronic medical record.  These signatures attest to the fact that that the information above on your After Visit Summary has been reviewed and is   understood.  Full responsibility of the confidentiality of this discharge information lies with you and/or your care-partner. 

## 2019-02-04 NOTE — Progress Notes (Signed)
[facility] temps, Juanna Cao, Aarion Metzgar checked in.  Beth IV.  Pt's states no medical or surgical changes since  office visit except chemo is finished.

## 2019-02-04 NOTE — Op Note (Signed)
[facility] Patient Name: [name] Procedure Date: 02/04/2019 12:48 PM MRN: [id number] Endoscopist: Milus Banister , MD Age: 38 Referring MD:  Date of Birth: 1980/10/21 Gender: Female Account #: [id number] Procedure:                Upper GI endoscopy Indications:              PMS2 mutation Lynch Syndrome Medicines:                Monitored Anesthesia Care Procedure:                Pre-Anesthesia Assessment:                           - Prior to the procedure, a History and Physical                            was performed, and patient medications and                            allergies were reviewed. The patient's tolerance of                            previous anesthesia was also reviewed. The risks                            and benefits of the procedure and the sedation                            options and risks were discussed with the patient.                            All questions were answered, and informed consent                            was obtained. Prior Anticoagulants: The patient has                            taken no previous anticoagulant or antiplatelet                            agents. ASA Grade Assessment: II - A patient with                            mild systemic disease. After reviewing the risks                            and benefits, the patient was deemed in                            satisfactory condition to undergo the procedure.                           After obtaining informed consent, the endoscope was  passed under direct vision. Throughout the                            procedure, the patient's blood pressure, pulse, and                            oxygen saturations were monitored continuously. The                            Endoscope was introduced through the mouth, and                            advanced to the second part of duodenum. The upper                            GI endoscopy was  accomplished without difficulty.                            The patient tolerated the procedure well. Scope In: Scope Out: Findings:                 Minimal inflammation characterized by granularity                            was found in the gastric antrum. Biopsies were                            taken with a cold forceps for histology.                           The exam was otherwise without abnormality. Complications:            No immediate complications. Estimated blood loss:                            None. Estimated Blood Loss:     Estimated blood loss: none. Impression:               - Very mild gastritis, biopsied to check for H.                            pylori.                           - The examination was otherwise normal. Recommendation:           - Patient has a contact number available for                            emergencies. The signs and symptoms of potential                            delayed complications were discussed with the                            patient. Return to normal activities tomorrow.  Written discharge instructions were provided to the                            patient.                           - Resume previous diet.                           - Continue present medications.                           - Repeat upper endoscopy in 3 years for screening                            purposes.                           - If biopsies show H. pylori infection appropriate                            antibiotics will be started. Milus Banister, MD 02/04/2019 1:53:55 PM This report has been signed electronically.

## 2019-02-04 NOTE — Progress Notes (Signed)
Called to room to assist during endoscopic procedure.  Patient ID and intended procedure confirmed with present staff. Received instructions for my participation in the procedure from the performing physician.  

## 2019-02-04 NOTE — Op Note (Signed)
[facility] Patient Name: [name] Procedure Date: 02/04/2019 12:49 PM MRN: [id number] Endoscopist: Milus Banister , MD Age: 38 Referring MD:  Date of Birth: 10-Nov-1980 Gender: Female Account #: [id number] Procedure:                Colonoscopy Indications:              Colon cancer screening in patient at increased                            risk: PMS2 mutation Lynch Syndrome Medicines:                Monitored Anesthesia Care Procedure:                Pre-Anesthesia Assessment:                           - Prior to the procedure, a History and Physical                            was performed, and patient medications and                            allergies were reviewed. The patient's tolerance of                            previous anesthesia was also reviewed. The risks                            and benefits of the procedure and the sedation                            options and risks were discussed with the patient.                            All questions were answered, and informed consent                            was obtained. Prior Anticoagulants: The patient has                            taken no previous anticoagulant or antiplatelet                            agents. ASA Grade Assessment: II - A patient with                            mild systemic disease. After reviewing the risks                            and benefits, the patient was deemed in                            satisfactory condition to undergo the procedure.  After obtaining informed consent, the colonoscope                            was passed under direct vision. Throughout the                            procedure, the patient's blood pressure, pulse, and                            oxygen saturations were monitored continuously. The                            LOANER 0255 was introduced through the anus and                            advanced to the the cecum,  identified by                            appendiceal orifice and ileocecal valve. The                            colonoscopy was performed without difficulty. The                            patient tolerated the procedure well. The quality                            of the bowel preparation was good. The ileocecal                            valve, appendiceal orifice, and rectum were                            photographed. Scope In: 1:32:57 PM Scope Out: 1:44:23 PM Scope Withdrawal Time: 0 hours 8 minutes 40 seconds  Total Procedure Duration: 0 hours 11 minutes 26 seconds  Findings:                 The entire examined colon appeared normal on direct                            and retroflexion views. Complications:            No immediate complications. Estimated blood loss:                            None. Estimated Blood Loss:     Estimated blood loss: none. Impression:               - The entire examined colon is normal on direct and                            retroflexion views.                           - No polyps or cancers. Recommendation:           -  Patient has a contact number available for                            emergencies. The signs and symptoms of potential                            delayed complications were discussed with the                            patient. Return to normal activities tomorrow.                            Written discharge instructions were provided to the                            patient.                           - Resume previous diet.                           - Continue present medications.                           - Repeat colonoscopy in 1 year for screening. Milus Banister, MD 02/04/2019 1:47:02 PM This report has been signed electronically.

## 2019-02-08 ENCOUNTER — Telehealth: Payer: Self-pay

## 2019-02-08 NOTE — Telephone Encounter (Signed)
  Follow up Call-  Call back number 02/04/2019  Post procedure Call Back phone  # [phone]  Permission to leave phone message Yes  Some recent data might be hidden     Patient questions:  Do you have a fever, pain , or abdominal swelling? No. Pain Score  0 *  Have you tolerated food without any problems? Yes.    Have you been able to return to your normal activities? Yes.    Do you have any questions about your discharge instructions: Diet   No. Medications  No. Follow up visit  No.  Do you have questions or concerns about your Care? No.  Actions: * If pain score is 4 or above: No action needed, pain <4. 1. Have you developed a fever since your procedure? no  2.   Have you had an respiratory symptoms (SOB or cough) since your procedure? no  3.   Have you tested positive for COVID 19 since your procedure no  4.   Have you had any family members/close contacts diagnosed with the COVID 19 since your procedure?  no   If yes to any of these questions please route to [name], RN and Alphonsa Gin, Therapist, sports.

## 2019-02-09 ENCOUNTER — Other Ambulatory Visit: Payer: Self-pay | Admitting: Hematology and Oncology

## 2019-02-10 ENCOUNTER — Inpatient Hospital Stay: Payer: BC Managed Care – PPO | Attending: Hematology and Oncology

## 2019-02-10 ENCOUNTER — Inpatient Hospital Stay: Payer: BC Managed Care – PPO

## 2019-02-10 ENCOUNTER — Other Ambulatory Visit: Payer: Self-pay

## 2019-02-10 ENCOUNTER — Encounter: Payer: Self-pay | Admitting: Gastroenterology

## 2019-02-10 ENCOUNTER — Ambulatory Visit (HOSPITAL_COMMUNITY)
Admission: RE | Admit: 2019-02-10 | Discharge: 2019-02-10 | Disposition: A | Discharge status: Home / Self Care | Payer: Self-pay | Source: Ambulatory Visit | Attending: Hematology and Oncology | Admitting: Hematology and Oncology

## 2019-02-10 VITALS — BP 133/96 | HR 77 | Temp 98.2°F | Resp 14

## 2019-02-10 VITALS — BP 137/76 | HR 84 | Temp 98.3°F | Resp 16

## 2019-02-10 DIAGNOSIS — Z006 Encounter for examination for normal comparison and control in clinical research program: Secondary | ICD-10-CM | POA: Insufficient documentation

## 2019-02-10 DIAGNOSIS — C50411 Malignant neoplasm of upper-outer quadrant of right female breast: Secondary | ICD-10-CM | POA: Insufficient documentation

## 2019-02-10 DIAGNOSIS — Z5112 Encounter for antineoplastic immunotherapy: Secondary | ICD-10-CM | POA: Diagnosis not present

## 2019-02-10 DIAGNOSIS — Z171 Estrogen receptor negative status [ER-]: Secondary | ICD-10-CM

## 2019-02-10 MED ORDER — DIPHENHYDRAMINE HCL 25 MG PO CAPS
ORAL_CAPSULE | ORAL | Status: AC
  Filled 2019-02-10: qty 2

## 2019-02-10 MED ORDER — SODIUM CHLORIDE 0.9% FLUSH
10.0000 mL | INTRAVENOUS | Status: DC | PRN
  Administered 2019-02-10: 10 mL
  Filled 2019-02-10: qty 10

## 2019-02-10 MED ORDER — SODIUM CHLORIDE 0.9 % IV SOLN
Freq: Once | INTRAVENOUS | Status: AC
  Administered 2019-02-10: 12:00:00 via INTRAVENOUS
  Filled 2019-02-10: qty 250

## 2019-02-10 MED ORDER — ACETAMINOPHEN 325 MG PO TABS
650.0000 mg | ORAL_TABLET | Freq: Once | ORAL | Status: AC
  Administered 2019-02-10: 650 mg via ORAL

## 2019-02-10 MED ORDER — TRASTUZUMAB-DKST CHEMO 150 MG IV SOLR: 6.0000 mg/kg | Freq: Once | INTRAVENOUS | Status: DC

## 2019-02-10 MED ORDER — DIPHENHYDRAMINE HCL 25 MG PO CAPS
50.0000 mg | ORAL_CAPSULE | Freq: Once | ORAL | Status: AC
  Administered 2019-02-10: 50 mg via ORAL

## 2019-02-10 MED ORDER — HEPARIN SOD (PORK) LOCK FLUSH 100 UNIT/ML IV SOLN
500.0000 [IU] | Freq: Once | INTRAVENOUS | Status: AC | PRN
  Administered 2019-02-10: 500 [IU]
  Filled 2019-02-10: qty 5

## 2019-02-10 MED ORDER — ACETAMINOPHEN 325 MG PO TABS
ORAL_TABLET | ORAL | Status: AC
  Filled 2019-02-10: qty 2

## 2019-02-10 MED ORDER — TRASTUZUMAB-DKST CHEMO 150 MG IV SOLR
450.0000 mg | Freq: Once | INTRAVENOUS | Status: AC
  Administered 2019-02-10: 450 mg via INTRAVENOUS
  Filled 2019-02-10: qty 21.43

## 2019-02-10 NOTE — Progress Notes (Addendum)
[place] 3 Month Visit  Patient arrived unaccompanied into clinic for purpose of 3 month UPBEAT visit.  Labs/VS: Labs were previously collected on 01/20/19. Vital signs were collected per protocol.  Waist measurement: 34.5 inches  Neurocognitive Assessments: Assessments were administered by this RN.  Physical Function Tests: Patient completed the physical function tests.  Questionnaires: Patient completed questionnaires in the infusion room. This RN checked them for completeness.  Concomitant Medications: Patient has started Wellbutrin 150mg  since her baseline visit.  MRI: MRI was completed this morning.  Gift Card/Tote Bag: Once all study assessments were completed, this RN dispensed the gift card and tote bag to patient.  Thanked patient for her time and continued participation.   Johney Maine RN, BSN Clinical Research  02/10/19 1:20 PM

## 2019-02-10 NOTE — Patient Instructions (Signed)
Trastuzumab injection for infusion What is this medicine? TRASTUZUMAB (tras TOO zoo mab) is a monoclonal antibody. It is used to treat breast cancer and stomach cancer. This medicine may be used for other purposes; ask your health care provider or pharmacist if you have questions. COMMON BRAND NAME(S): Herceptin, Herzuma, KANJINTI, Ogivri, Ontruzant, Trazimera What should I tell my health care provider before I take this medicine? They need to know if you have any of these conditions:  heart disease  heart failure  lung or breathing disease, like asthma  an unusual or allergic reaction to trastuzumab, benzyl alcohol, or other medications, foods, dyes, or preservatives  pregnant or trying to get pregnant  breast-feeding How should I use this medicine? This drug is given as an infusion into a vein. It is administered in a hospital or clinic by a specially trained health care professional. Talk to your pediatrician regarding the use of this medicine in children. This medicine is not approved for use in children. Overdosage: If you think you have taken too much of this medicine contact a poison control center or emergency room at once. NOTE: This medicine is only for you. Do not share this medicine with others. What if I miss a dose? It is important not to miss a dose. Call your doctor or health care professional if you are unable to keep an appointment. What may interact with this medicine? This medicine may interact with the following medications:  certain types of chemotherapy, such as daunorubicin, doxorubicin, epirubicin, and idarubicin This list may not describe all possible interactions. Give your health care provider a list of all the medicines, herbs, non-prescription drugs, or dietary supplements you use. Also tell them if you smoke, drink alcohol, or use illegal drugs. Some items may interact with your medicine. What should I watch for while using this medicine? Visit your  doctor for checks on your progress. Report any side effects. Continue your course of treatment even though you feel ill unless your doctor tells you to stop. Call your doctor or health care professional for advice if you get a fever, chills or sore throat, or other symptoms of a cold or flu. Do not treat yourself. Try to avoid being around people who are sick. You may experience fever, chills and shaking during your first infusion. These effects are usually mild and can be treated with other medicines. Report any side effects during the infusion to your health care professional. Fever and chills usually do not happen with later infusions. Do not become pregnant while taking this medicine or for 7 months after stopping it. Women should inform their doctor if they wish to become pregnant or think they might be pregnant. Women of child-bearing potential will need to have a negative pregnancy test before starting this medicine. There is a potential for serious side effects to an unborn child. Talk to your health care professional or pharmacist for more information. Do not breast-feed an infant while taking this medicine or for 7 months after stopping it. Women must use effective birth control with this medicine. What side effects may I notice from receiving this medicine? Side effects that you should report to your doctor or health care professional as soon as possible:  allergic reactions like skin rash, itching or hives, swelling of the face, lips, or tongue  chest pain or palpitations  cough  dizziness  feeling faint or lightheaded, falls  fever  general ill feeling or flu-like symptoms  signs of worsening heart failure like   breathing problems; swelling in your legs and feet  unusually weak or tired Side effects that usually do not require medical attention (report to your doctor or health care professional if they continue or are bothersome):  bone pain  changes in  taste  diarrhea  joint pain  nausea/vomiting  weight loss This list may not describe all possible side effects. Call your doctor for medical advice about side effects. You may report side effects to FDA at 1-800-FDA-1088. Where should I keep my medicine? This drug is given in a hospital or clinic and will not be stored at home. NOTE: This sheet is a summary. It may not cover all possible information. If you have questions about this medicine, talk to your doctor, pharmacist, or health care provider.  2020 Elsevier/Gold Standard (2016-04-15 14:37:52)  

## 2019-02-14 ENCOUNTER — Other Ambulatory Visit: Payer: Self-pay

## 2019-02-14 ENCOUNTER — Encounter (HOSPITAL_BASED_OUTPATIENT_CLINIC_OR_DEPARTMENT_OTHER): Payer: Self-pay | Admitting: *Deleted

## 2019-02-14 ENCOUNTER — Encounter: Payer: Self-pay | Admitting: Hematology and Oncology

## 2019-02-16 ENCOUNTER — Other Ambulatory Visit: Payer: Self-pay | Admitting: Hematology and Oncology

## 2019-02-16 MED ORDER — LORAZEPAM 0.5 MG PO TABS: 0.5000 mg | ORAL_TABLET | Freq: Every evening | ORAL | 1 refills | Status: DC | PRN

## 2019-02-17 DIAGNOSIS — N83299 Other ovarian cyst, unspecified side: Secondary | ICD-10-CM | POA: Diagnosis not present

## 2019-02-17 DIAGNOSIS — R9389 Abnormal findings on diagnostic imaging of other specified body structures: Secondary | ICD-10-CM | POA: Diagnosis not present

## 2019-02-17 DIAGNOSIS — Z1509 Genetic susceptibility to other malignant neoplasm: Secondary | ICD-10-CM | POA: Diagnosis not present

## 2019-02-17 DIAGNOSIS — Z3202 Encounter for pregnancy test, result negative: Secondary | ICD-10-CM | POA: Diagnosis not present

## 2019-02-17 NOTE — Progress Notes (Signed)
Given to husband.  Instructed as below.     Enhanced Recovery after Surgery for Orthopedics Enhanced Recovery after Surgery is a protocol used to improve the stress on your body and your recovery after surgery.  Patient Instructions  . The night before surgery:  o No food after midnight. ONLY clear liquids after midnight  . The day of surgery (if you do NOT have diabetes):  o Drink ONE (1) Pre-Surgery Clear Ensure as directed.   o This drink was given to you during your hospital  pre-op appointment visit. o The pre-op nurse will instruct you on the time to drink the  Pre-Surgery Ensure depending on your surgery time. o Finish the drink at the designated time by the pre-op nurse.  o Nothing else to drink after completing the  Pre-Surgery Clear Ensure.  . The day of surgery (if you have diabetes): o Drink ONE (1) Gatorade 2 (G2) as directed. o This drink was given to you during your hospital  pre-op appointment visit.  o The pre-op nurse will instruct you on the time to drink the   Gatorade 2 (G2) depending on your surgery time. o Color of the Gatorade may vary. Red is not allowed. o Nothing else to drink after completing the  Gatorade 2 (G2).         If you have questions, please contact your surgeon's office.

## 2019-02-18 ENCOUNTER — Other Ambulatory Visit (HOSPITAL_COMMUNITY)
Admission: RE | Admit: 2019-02-18 | Discharge: 2019-02-18 | Disposition: A | Discharge status: Home / Self Care | Payer: BC Managed Care – PPO | Source: Ambulatory Visit | Attending: General Surgery | Admitting: General Surgery

## 2019-02-18 DIAGNOSIS — Z20828 Contact with and (suspected) exposure to other viral communicable diseases: Secondary | ICD-10-CM | POA: Diagnosis not present

## 2019-02-18 DIAGNOSIS — Z01812 Encounter for preprocedural laboratory examination: Secondary | ICD-10-CM | POA: Insufficient documentation

## 2019-02-19 LAB — NOVEL CORONAVIRUS, NAA (HOSP ORDER, SEND-OUT TO REF LAB; TAT 18-24 HRS): SARS-CoV-2, NAA: NOT DETECTED

## 2019-02-22 ENCOUNTER — Encounter (HOSPITAL_BASED_OUTPATIENT_CLINIC_OR_DEPARTMENT_OTHER): Payer: Self-pay | Admitting: Anesthesiology

## 2019-02-22 ENCOUNTER — Ambulatory Visit (HOSPITAL_COMMUNITY)
Admission: RE | Admit: 2019-02-22 | Discharge: 2019-02-22 | Disposition: A | Discharge status: Home / Self Care | Payer: BC Managed Care – PPO | Source: Ambulatory Visit | Attending: General Surgery | Admitting: General Surgery

## 2019-02-22 ENCOUNTER — Encounter (HOSPITAL_BASED_OUTPATIENT_CLINIC_OR_DEPARTMENT_OTHER)
Admission: RE | Disposition: A | Discharge status: Home / Self Care | Payer: Self-pay | Source: Home / Self Care | Attending: General Surgery

## 2019-02-22 ENCOUNTER — Ambulatory Visit (HOSPITAL_BASED_OUTPATIENT_CLINIC_OR_DEPARTMENT_OTHER)
Admission: RE | Admit: 2019-02-22 | Discharge: 2019-02-23 | Disposition: A | Discharge status: Home / Self Care | Payer: BC Managed Care – PPO | Attending: General Surgery | Admitting: General Surgery

## 2019-02-22 ENCOUNTER — Other Ambulatory Visit: Payer: Self-pay

## 2019-02-22 ENCOUNTER — Ambulatory Visit (HOSPITAL_BASED_OUTPATIENT_CLINIC_OR_DEPARTMENT_OTHER): Payer: BC Managed Care – PPO | Admitting: Anesthesiology

## 2019-02-22 DIAGNOSIS — F419 Anxiety disorder, unspecified: Secondary | ICD-10-CM | POA: Insufficient documentation

## 2019-02-22 DIAGNOSIS — C50911 Malignant neoplasm of unspecified site of right female breast: Secondary | ICD-10-CM | POA: Diagnosis not present

## 2019-02-22 DIAGNOSIS — Z79899 Other long term (current) drug therapy: Secondary | ICD-10-CM | POA: Insufficient documentation

## 2019-02-22 DIAGNOSIS — Z9221 Personal history of antineoplastic chemotherapy: Secondary | ICD-10-CM | POA: Insufficient documentation

## 2019-02-22 DIAGNOSIS — Z853 Personal history of malignant neoplasm of breast: Secondary | ICD-10-CM | POA: Diagnosis not present

## 2019-02-22 DIAGNOSIS — M1990 Unspecified osteoarthritis, unspecified site: Secondary | ICD-10-CM | POA: Insufficient documentation

## 2019-02-22 DIAGNOSIS — Z1501 Genetic susceptibility to malignant neoplasm of breast: Secondary | ICD-10-CM | POA: Diagnosis not present

## 2019-02-22 DIAGNOSIS — Z171 Estrogen receptor negative status [ER-]: Secondary | ICD-10-CM | POA: Insufficient documentation

## 2019-02-22 DIAGNOSIS — Z1509 Genetic susceptibility to other malignant neoplasm: Secondary | ICD-10-CM | POA: Insufficient documentation

## 2019-02-22 DIAGNOSIS — Z803 Family history of malignant neoplasm of breast: Secondary | ICD-10-CM | POA: Insufficient documentation

## 2019-02-22 DIAGNOSIS — C50011 Malignant neoplasm of nipple and areola, right female breast: Secondary | ICD-10-CM

## 2019-02-22 DIAGNOSIS — G8918 Other acute postprocedural pain: Secondary | ICD-10-CM | POA: Diagnosis not present

## 2019-02-22 DIAGNOSIS — C50411 Malignant neoplasm of upper-outer quadrant of right female breast: Secondary | ICD-10-CM | POA: Insufficient documentation

## 2019-02-22 DIAGNOSIS — C50919 Malignant neoplasm of unspecified site of unspecified female breast: Secondary | ICD-10-CM | POA: Diagnosis present

## 2019-02-22 DIAGNOSIS — Z4001 Encounter for prophylactic removal of breast: Secondary | ICD-10-CM | POA: Diagnosis not present

## 2019-02-22 HISTORY — PX: BREAST RECONSTRUCTION WITH PLACEMENT OF TISSUE EXPANDER AND FLEX HD (ACELLULAR HYDRATED DERMIS): SHX6295

## 2019-02-22 HISTORY — PX: NIPPLE SPARING MASTECTOMY WITH SENTINEL LYMPH NODE BIOPSY: SHX6826

## 2019-02-22 SURGERY — NIPPLE SPARING MASTECTOMY WITH SENTINEL LYMPH NODE BIOPSY
Anesthesia: General | Site: Breast | Laterality: Bilateral

## 2019-02-22 MED ORDER — FENTANYL CITRATE (PF) 100 MCG/2ML IJ SOLN
50.0000 ug | INTRAMUSCULAR | Status: DC | PRN
  Administered 2019-02-22: 100 ug via INTRAVENOUS

## 2019-02-22 MED ORDER — TECHNETIUM TC 99M SULFUR COLLOID FILTERED
1.0000 | Freq: Once | INTRAVENOUS | Status: AC | PRN
  Administered 2019-02-22: 1 via INTRADERMAL

## 2019-02-22 MED ORDER — OXYCODONE HCL 5 MG PO TABS
5.0000 mg | ORAL_TABLET | ORAL | Status: DC | PRN
  Administered 2019-02-22 – 2019-02-23 (×3): 5 mg via ORAL
  Filled 2019-02-22 (×3): qty 1

## 2019-02-22 MED ORDER — CEFAZOLIN SODIUM-DEXTROSE 2-4 GM/100ML-% IV SOLN
INTRAVENOUS | Status: AC
  Filled 2019-02-22: qty 100

## 2019-02-22 MED ORDER — ONDANSETRON 4 MG PO TBDP: 4.0000 mg | ORAL_TABLET | Freq: Four times a day (QID) | ORAL | Status: DC | PRN

## 2019-02-22 MED ORDER — DIPHENHYDRAMINE HCL 50 MG/ML IJ SOLN
INTRAMUSCULAR | Status: DC | PRN
  Administered 2019-02-22: 12.5 mg via INTRAVENOUS

## 2019-02-22 MED ORDER — DIPHENHYDRAMINE HCL 50 MG/ML IJ SOLN
INTRAMUSCULAR | Status: AC
  Filled 2019-02-22: qty 1

## 2019-02-22 MED ORDER — GABAPENTIN 100 MG PO CAPS
ORAL_CAPSULE | ORAL | Status: AC
  Filled 2019-02-22: qty 1

## 2019-02-22 MED ORDER — ENOXAPARIN SODIUM 40 MG/0.4ML ~~LOC~~ SOLN
40.0000 mg | SUBCUTANEOUS | Status: DC
  Administered 2019-02-23: 40 mg via SUBCUTANEOUS
  Filled 2019-02-22: qty 0.4

## 2019-02-22 MED ORDER — ACETAMINOPHEN 500 MG PO TABS
1000.0000 mg | ORAL_TABLET | ORAL | Status: AC
  Administered 2019-02-22: 1000 mg via ORAL

## 2019-02-22 MED ORDER — PROMETHAZINE HCL 25 MG/ML IJ SOLN
6.2500 mg | Freq: Four times a day (QID) | INTRAMUSCULAR | Status: DC | PRN
  Administered 2019-02-22: 6.25 mg via INTRAVENOUS

## 2019-02-22 MED ORDER — EPHEDRINE 5 MG/ML INJ
INTRAVENOUS | Status: AC
  Filled 2019-02-22: qty 10

## 2019-02-22 MED ORDER — ROCURONIUM BROMIDE 100 MG/10ML IV SOLN
INTRAVENOUS | Status: DC | PRN
  Administered 2019-02-22: 50 mg via INTRAVENOUS

## 2019-02-22 MED ORDER — ONDANSETRON HCL 4 MG/2ML IJ SOLN
INTRAMUSCULAR | Status: AC
  Filled 2019-02-22: qty 2

## 2019-02-22 MED ORDER — ONDANSETRON HCL 4 MG/2ML IJ SOLN: 4.0000 mg | Freq: Four times a day (QID) | INTRAMUSCULAR | Status: DC | PRN

## 2019-02-22 MED ORDER — ONDANSETRON HCL 4 MG/2ML IJ SOLN
INTRAMUSCULAR | Status: DC | PRN
  Administered 2019-02-22 (×2): 4 mg via INTRAVENOUS

## 2019-02-22 MED ORDER — METHOCARBAMOL 500 MG PO TABS
500.0000 mg | ORAL_TABLET | Freq: Four times a day (QID) | ORAL | Status: DC | PRN
  Administered 2019-02-22 (×2): 500 mg via ORAL
  Filled 2019-02-22 (×2): qty 1

## 2019-02-22 MED ORDER — CEFAZOLIN SODIUM-DEXTROSE 2-4 GM/100ML-% IV SOLN
2.0000 g | Freq: Three times a day (TID) | INTRAVENOUS | Status: DC
  Administered 2019-02-22 – 2019-02-23 (×2): 2 g via INTRAVENOUS
  Filled 2019-02-22 (×2): qty 100

## 2019-02-22 MED ORDER — ACETAMINOPHEN 500 MG PO TABS
ORAL_TABLET | ORAL | Status: AC
  Filled 2019-02-22: qty 2

## 2019-02-22 MED ORDER — BUPROPION HCL ER (XL) 150 MG PO TB24
150.0000 mg | ORAL_TABLET | Freq: Every day | ORAL | Status: DC
  Administered 2019-02-22: 150 mg via ORAL
  Filled 2019-02-22: qty 1

## 2019-02-22 MED ORDER — SUFENTANIL CITRATE 50 MCG/ML IV SOLN
INTRAVENOUS | Status: AC
  Filled 2019-02-22: qty 1

## 2019-02-22 MED ORDER — MIDAZOLAM HCL 2 MG/2ML IJ SOLN
INTRAMUSCULAR | Status: AC
  Filled 2019-02-22: qty 2

## 2019-02-22 MED ORDER — SODIUM CHLORIDE (PF) 0.9 % IJ SOLN
INTRAMUSCULAR | Status: AC
  Filled 2019-02-22: qty 10

## 2019-02-22 MED ORDER — LIDOCAINE HCL (CARDIAC) PF 100 MG/5ML IV SOSY
PREFILLED_SYRINGE | INTRAVENOUS | Status: DC | PRN
  Administered 2019-02-22: 50 mg via INTRAVENOUS

## 2019-02-22 MED ORDER — KETOROLAC TROMETHAMINE 30 MG/ML IJ SOLN
30.0000 mg | Freq: Three times a day (TID) | INTRAMUSCULAR | Status: AC
  Administered 2019-02-22 – 2019-02-23 (×3): 30 mg via INTRAVENOUS
  Filled 2019-02-22 (×3): qty 1

## 2019-02-22 MED ORDER — MEPERIDINE HCL 25 MG/ML IJ SOLN: 6.2500 mg | INTRAMUSCULAR | Status: DC | PRN

## 2019-02-22 MED ORDER — OXYCODONE HCL 5 MG PO TABS: 5.0000 mg | ORAL_TABLET | ORAL | 0 refills | Status: DC | PRN

## 2019-02-22 MED ORDER — KETOROLAC TROMETHAMINE 15 MG/ML IJ SOLN
INTRAMUSCULAR | Status: AC
  Filled 2019-02-22: qty 1

## 2019-02-22 MED ORDER — SUCCINYLCHOLINE CHLORIDE 200 MG/10ML IV SOSY
PREFILLED_SYRINGE | INTRAVENOUS | Status: AC
  Filled 2019-02-22: qty 10

## 2019-02-22 MED ORDER — CEFAZOLIN SODIUM-DEXTROSE 2-4 GM/100ML-% IV SOLN
2.0000 g | INTRAVENOUS | Status: AC
  Administered 2019-02-22 (×2): 2 g via INTRAVENOUS

## 2019-02-22 MED ORDER — LORAZEPAM 0.5 MG PO TABS: 0.5000 mg | ORAL_TABLET | Freq: Every evening | ORAL | Status: DC | PRN

## 2019-02-22 MED ORDER — SUFENTANIL CITRATE 50 MCG/ML IV SOLN
INTRAVENOUS | Status: DC | PRN
  Administered 2019-02-22: 5 ug via INTRAVENOUS
  Administered 2019-02-22 (×2): 10 ug via INTRAVENOUS
  Administered 2019-02-22: 5 ug via INTRAVENOUS
  Administered 2019-02-22 (×2): 10 ug via INTRAVENOUS

## 2019-02-22 MED ORDER — BUPIVACAINE HCL (PF) 0.25 % IJ SOLN
INTRAMUSCULAR | Status: AC
  Filled 2019-02-22: qty 30

## 2019-02-22 MED ORDER — SODIUM CHLORIDE 0.9 % IV SOLN
INTRAVENOUS | Status: DC
  Administered 2019-02-22: 13:00:00 via INTRAVENOUS

## 2019-02-22 MED ORDER — ENSURE PRE-SURGERY PO LIQD: 296.0000 mL | Freq: Once | ORAL | Status: DC

## 2019-02-22 MED ORDER — PROPOFOL 10 MG/ML IV BOLUS
INTRAVENOUS | Status: DC | PRN
  Administered 2019-02-22: 150 mg via INTRAVENOUS

## 2019-02-22 MED ORDER — FENTANYL CITRATE (PF) 100 MCG/2ML IJ SOLN: 25.0000 ug | INTRAMUSCULAR | Status: DC | PRN

## 2019-02-22 MED ORDER — SODIUM CHLORIDE 0.9 % IV SOLN
INTRAVENOUS | Status: DC | PRN
  Administered 2019-02-22: 1000 mL

## 2019-02-22 MED ORDER — PHENYLEPHRINE 40 MCG/ML (10ML) SYRINGE FOR IV PUSH (FOR BLOOD PRESSURE SUPPORT)
PREFILLED_SYRINGE | INTRAVENOUS | Status: AC
  Filled 2019-02-22: qty 10

## 2019-02-22 MED ORDER — KETOROLAC TROMETHAMINE 15 MG/ML IJ SOLN
15.0000 mg | INTRAMUSCULAR | Status: AC
  Administered 2019-02-22: 15 mg via INTRAVENOUS

## 2019-02-22 MED ORDER — PROMETHAZINE HCL 25 MG/ML IJ SOLN
INTRAMUSCULAR | Status: AC
  Filled 2019-02-22: qty 1

## 2019-02-22 MED ORDER — ACETAMINOPHEN 500 MG PO TABS
1000.0000 mg | ORAL_TABLET | Freq: Four times a day (QID) | ORAL | Status: DC
  Administered 2019-02-23: 1000 mg via ORAL
  Filled 2019-02-22: qty 2

## 2019-02-22 MED ORDER — LACTATED RINGERS IV SOLN
INTRAVENOUS | Status: DC
  Administered 2019-02-22 (×3): via INTRAVENOUS

## 2019-02-22 MED ORDER — PROPOFOL 500 MG/50ML IV EMUL
INTRAVENOUS | Status: DC | PRN
  Administered 2019-02-22: 15 ug/kg/min via INTRAVENOUS

## 2019-02-22 MED ORDER — DEXAMETHASONE SODIUM PHOSPHATE 4 MG/ML IJ SOLN
INTRAMUSCULAR | Status: DC | PRN
  Administered 2019-02-22: 10 mg via INTRAVENOUS

## 2019-02-22 MED ORDER — FENTANYL CITRATE (PF) 100 MCG/2ML IJ SOLN
INTRAMUSCULAR | Status: AC
  Filled 2019-02-22: qty 2

## 2019-02-22 MED ORDER — DEXAMETHASONE SODIUM PHOSPHATE 10 MG/ML IJ SOLN
INTRAMUSCULAR | Status: AC
  Filled 2019-02-22: qty 1

## 2019-02-22 MED ORDER — BUPIVACAINE HCL (PF) 0.25 % IJ SOLN
INTRAMUSCULAR | Status: DC | PRN
  Administered 2019-02-22 (×2): 30 mL

## 2019-02-22 MED ORDER — METHOCARBAMOL 500 MG PO TABS: 500.0000 mg | ORAL_TABLET | Freq: Three times a day (TID) | ORAL | 0 refills | Status: DC | PRN

## 2019-02-22 MED ORDER — MIDAZOLAM HCL 2 MG/2ML IJ SOLN
1.0000 mg | INTRAMUSCULAR | Status: DC | PRN
  Administered 2019-02-22 (×2): 2 mg via INTRAVENOUS

## 2019-02-22 MED ORDER — ROCURONIUM BROMIDE 10 MG/ML (PF) SYRINGE
PREFILLED_SYRINGE | INTRAVENOUS | Status: AC
  Filled 2019-02-22: qty 10

## 2019-02-22 MED ORDER — MORPHINE SULFATE (PF) 4 MG/ML IV SOLN: 1.0000 mg | INTRAVENOUS | Status: DC | PRN

## 2019-02-22 MED ORDER — SIMETHICONE 80 MG PO CHEW: 40.0000 mg | CHEWABLE_TABLET | Freq: Four times a day (QID) | ORAL | Status: DC | PRN

## 2019-02-22 MED ORDER — METHYLENE BLUE 0.5 % INJ SOLN
INTRAVENOUS | Status: AC
  Filled 2019-02-22: qty 10

## 2019-02-22 MED ORDER — GABAPENTIN 100 MG PO CAPS
100.0000 mg | ORAL_CAPSULE | ORAL | Status: AC
  Administered 2019-02-22: 100 mg via ORAL

## 2019-02-22 MED ORDER — LIDOCAINE 2% (20 MG/ML) 5 ML SYRINGE
INTRAMUSCULAR | Status: AC
  Filled 2019-02-22: qty 5

## 2019-02-22 MED ORDER — SULFAMETHOXAZOLE-TRIMETHOPRIM 800-160 MG PO TABS: 1.0000 | ORAL_TABLET | Freq: Two times a day (BID) | ORAL | 0 refills | Status: DC

## 2019-02-22 MED ORDER — SUGAMMADEX SODIUM 200 MG/2ML IV SOLN
INTRAVENOUS | Status: DC | PRN
  Administered 2019-02-22: 200 mg via INTRAVENOUS

## 2019-02-22 MED ORDER — LACTATED RINGERS IV SOLN: INTRAVENOUS | Status: DC

## 2019-02-22 MED ORDER — 0.9 % SODIUM CHLORIDE (POUR BTL) OPTIME
TOPICAL | Status: DC | PRN
  Administered 2019-02-22: 1000 mL

## 2019-02-22 MED ORDER — METOCLOPRAMIDE HCL 5 MG/ML IJ SOLN: 10.0000 mg | Freq: Once | INTRAMUSCULAR | Status: DC | PRN

## 2019-02-22 SURGICAL SUPPLY — 79 items
ADH SKN CLS APL DERMABOND .7 (GAUZE/BANDAGES/DRESSINGS) ×2
ALLOGRAFT PERF 16X20 1.6+/-0.4 (Tissue) ×2 IMPLANT
APL PRP STRL LF DISP 70% ISPRP (MISCELLANEOUS) ×2
BAG DECANTER FOR FLEXI CONT (MISCELLANEOUS) ×2 IMPLANT
BINDER BREAST LRG (GAUZE/BANDAGES/DRESSINGS) IMPLANT
BINDER BREAST MEDIUM (GAUZE/BANDAGES/DRESSINGS) IMPLANT
BINDER BREAST XLRG (GAUZE/BANDAGES/DRESSINGS) IMPLANT
BINDER BREAST XXLRG (GAUZE/BANDAGES/DRESSINGS) IMPLANT
BLADE SURG 10 STRL SS (BLADE) ×3 IMPLANT
BLADE SURG 15 STRL LF DISP TIS (BLADE) IMPLANT
BLADE SURG 15 STRL SS (BLADE) ×4
BNDG GAUZE ELAST 4 BULKY (GAUZE/BANDAGES/DRESSINGS) ×4 IMPLANT
CANISTER SUCT 1200ML W/VALVE (MISCELLANEOUS) ×2 IMPLANT
CHLORAPREP W/TINT 26 (MISCELLANEOUS) ×3 IMPLANT
COUNTER NEEDLE 1200 MAGNETIC (NEEDLE) IMPLANT
COVER BACK TABLE REUSABLE LG (DRAPES) ×2 IMPLANT
COVER MAYO STAND REUSABLE (DRAPES) ×3 IMPLANT
COVER WAND RF STERILE (DRAPES) IMPLANT
DECANTER SPIKE VIAL GLASS SM (MISCELLANEOUS) IMPLANT
DERMABOND ADVANCED (GAUZE/BANDAGES/DRESSINGS) ×2
DERMABOND ADVANCED .7 DNX12 (GAUZE/BANDAGES/DRESSINGS) ×1 IMPLANT
DRAIN CHANNEL 15F RND FF W/TCR (WOUND CARE) ×3 IMPLANT
DRAIN CHANNEL 19F RND (DRAIN) ×2 IMPLANT
DRAPE HALF SHEET 70X43 (DRAPES) ×3 IMPLANT
DRAPE INCISE IOBAN 66X45 STRL (DRAPES) ×1 IMPLANT
DRAPE TOP ARMCOVERS (MISCELLANEOUS) ×2 IMPLANT
DRAPE U-SHAPE 76X120 STRL (DRAPES) ×2 IMPLANT
DRAPE UTILITY XL STRL (DRAPES) ×3 IMPLANT
DRSG PAD ABDOMINAL 8X10 ST (GAUZE/BANDAGES/DRESSINGS) ×4 IMPLANT
DRSG TEGADERM 4X10 (GAUZE/BANDAGES/DRESSINGS) ×3 IMPLANT
DRSG TEGADERM 4X4.75 (GAUZE/BANDAGES/DRESSINGS) IMPLANT
ELECT BLADE 4.0 EZ CLEAN MEGAD (MISCELLANEOUS) ×2
ELECT COATED BLADE 2.86 ST (ELECTRODE) ×2 IMPLANT
ELECT REM PT RETURN 9FT ADLT (ELECTROSURGICAL) ×2
ELECTRODE BLDE 4.0 EZ CLN MEGD (MISCELLANEOUS) ×1 IMPLANT
ELECTRODE REM PT RTRN 9FT ADLT (ELECTROSURGICAL) ×1 IMPLANT
EVACUATOR SILICONE 100CC (DRAIN) ×5 IMPLANT
EXPANDER TISSUE FORTE 300CC (Breast) IMPLANT
GLOVE BIO SURGEON STRL SZ 6 (GLOVE) ×7 IMPLANT
GLOVE BIO SURGEON STRL SZ 6.5 (GLOVE) ×4 IMPLANT
GLOVE BIOGEL PI IND STRL 6.5 (GLOVE) IMPLANT
GLOVE BIOGEL PI INDICATOR 6.5 (GLOVE) ×4
GOWN STRL REUS W/ TWL LRG LVL3 (GOWN DISPOSABLE) ×2 IMPLANT
GOWN STRL REUS W/TWL LRG LVL3 (GOWN DISPOSABLE) ×12
IV NS 500ML (IV SOLUTION)
IV NS 500ML BAXH (IV SOLUTION) ×1 IMPLANT
KIT FILL SYSTEM UNIVERSAL (SET/KITS/TRAYS/PACK) ×1 IMPLANT
MARKER SKIN DUAL TIP RULER LAB (MISCELLANEOUS) IMPLANT
NDL HYPO 25X1 1.5 SAFETY (NEEDLE) IMPLANT
NEEDLE HYPO 25X1 1.5 SAFETY (NEEDLE) IMPLANT
PACK BASIN DAY SURGERY FS (CUSTOM PROCEDURE TRAY) ×2 IMPLANT
PENCIL BUTTON HOLSTER BLD 10FT (ELECTRODE) ×2 IMPLANT
PIN SAFETY STERILE (MISCELLANEOUS) ×2 IMPLANT
PUNCH BIOPSY DERMAL 4MM (MISCELLANEOUS) IMPLANT
RETRACTOR ONETRAX LX 135X30 (MISCELLANEOUS) ×1 IMPLANT
SLEEVE SCD COMPRESS KNEE MED (MISCELLANEOUS) ×2 IMPLANT
SPONGE LAP 18X18 RF (DISPOSABLE) ×8 IMPLANT
STAPLER VISISTAT 35W (STAPLE) ×1 IMPLANT
SUT CHROMIC 4 0 PS 2 18 (SUTURE) ×5 IMPLANT
SUT ETHILON 2 0 FS 18 (SUTURE) ×2 IMPLANT
SUT MNCRL AB 4-0 PS2 18 (SUTURE) ×4 IMPLANT
SUT PDS AB 2-0 CT2 27 (SUTURE) IMPLANT
SUT SILK 2 0 SH (SUTURE) ×1 IMPLANT
SUT VIC AB 2-0 SH 27 (SUTURE) ×2
SUT VIC AB 2-0 SH 27XBRD (SUTURE) IMPLANT
SUT VIC AB 3-0 SH 27 (SUTURE) ×6
SUT VIC AB 3-0 SH 27X BRD (SUTURE) ×1 IMPLANT
SUT VICRYL 0 CT-2 (SUTURE) ×3 IMPLANT
SUT VICRYL 4-0 PS2 18IN ABS (SUTURE) ×3 IMPLANT
SUT VLOC 180 0 24IN GS25 (SUTURE) ×2 IMPLANT
SYR BULB IRRIGATION 50ML (SYRINGE) ×3 IMPLANT
SYR CONTROL 10ML LL (SYRINGE) IMPLANT
TAPE MEASURE VINYL STERILE (MISCELLANEOUS) ×1 IMPLANT
TISSUE EXPANDER FORTE 300CC (Breast) ×4 IMPLANT
TOWEL GREEN STERILE FF (TOWEL DISPOSABLE) ×4 IMPLANT
TRAY FOLEY W/BAG SLVR 14FR LF (SET/KITS/TRAYS/PACK) ×1 IMPLANT
TUBE CONNECTING 20X1/4 (TUBING) ×3 IMPLANT
UNDERPAD 30X36 HEAVY ABSORB (UNDERPADS AND DIAPERS) ×4 IMPLANT
YANKAUER SUCT BULB TIP NO VENT (SUCTIONS) ×2 IMPLANT

## 2019-02-22 NOTE — Anesthesia Procedure Notes (Signed)
Anesthesia Regional Block: Pectoralis block   Pre-Anesthetic Checklist: ,, timeout performed, Correct Patient, Correct Site, Correct Laterality, Correct Procedure, Correct Position, site marked, Risks and benefits discussed,  Surgical consent,  Pre-op evaluation,  At surgeon's request and post-op pain management  Laterality: Left and Right  Prep: Maximum Sterile Barrier Precautions used, chloraprep       Needles:  Injection technique: Single-shot  Needle Type: Echogenic Stimulator Needle     Needle Length: 10cm      Additional Needles:   Procedures:,,,, ultrasound used (permanent image in chart),,,,  Narrative:  Start time: 02/22/2019 6:50 AM End time: 02/22/2019 7:00 AM Injection made incrementally with aspirations every 5 mL.  Performed by: Personally  Anesthesiologist: Montez Hageman, MD  Additional Notes: Risks, benefits and alternative to block explained extensively.  Patient tolerated procedure well, without complications.

## 2019-02-22 NOTE — Op Note (Signed)
Preoperative diagnosis: clinical stage I right breast cancer s/p primary chemotherapy Postoperative diagnosis: Same as above Procedure: 1. Left risk reducing nipple sparing mastectomy 2. Right nipple sparing mastectomy 3. Right  deep axillary sentinel lymph node biopsy Surgeon: Dr. Serita Grammes Anesthesia: General with pectoral block Estimated blood loss: 200 cc Drains:  Per plastic surgery Specimens: 1. Left mastectomy marked short superior, long lateral 2. Right mastectomy marked short superior, long lateral 3  Right axillary sentinel lymph nodes with highest count of 1200 4. Left nipple specimen 5. Right nipple specimen Complications: None Disposition to recovery in stable condition  Indications: This a 37 yof who presented with a right breast cancer.  She ended up having Lynch syndrome and due to covid we elected to proceed with chemotherapy which she has done well with.  She has complete MRI response. We discussed options and she elected to proceed with bilateral nsm, right ax sn biopsy and immediate expander reconstruction.   Procedure: After informed consent was obtained the patient first underwent bilateral pectoral blocks.  She was then placed under general anesthesia without complication. She had been given antibiotics. SCDs were in place. She was prepped and draped in the standard sterile surgical fashion. A surgical timeout was then performed.  I did the risk reducing left side first.  I made an inframammary incision. I released the posterior plane first including the pectoralis fascia. I then created flaps medially to the parasternal area, the clavicle, the inframammary fold, and the latissimus laterally. I then removed the breast tissue as well as the pectoralis fascia from the muscle. I then marked this as above.  I then removed the nipple as a separate specimen.  I obtained hemostasis and then packed this and moved to the other side.   I then made a similar  incision on the right side. I created the posterior plan first again. I then created flaps medially to the parasternal area, the clavicle, the inframammary fold, and the latissimus laterally. I then removed the breast tissue as well as the pectoralis fascia from the muscle. I marked this as above and passed off the table. I obtained hemostasis and then packed this.I removed the nipple again as a separate specimen and trimmed the flaps. Both sides were difficult due to very dense breast tissue adherent to the skin.    I elected to not use blue dye with a nsm as I had good signal in the axilla. I then made an axillary incision. I then located several sentinel lymph nodes with the neoprobe. . There was no background radioactivity. There were no other palpable nodes. I then obtained hemostasis. I then closed the axillary fascia with 2-0 vicryl. I closed the skin with 3-0 vicryl and 4-0 monocryl. Glue was placed.   Case was then turned over to plastic surgery for reconstruction

## 2019-02-22 NOTE — Progress Notes (Signed)
Assisted Dr. Marcell Barlow with right, left, ultrasound guided, pectoralis block. Side rails up, monitors on throughout procedure. See vital signs in flow sheet. Tolerated Procedure well.

## 2019-02-22 NOTE — Transfer of Care (Signed)
Immediate Anesthesia Transfer of Care Note  Patient: [name]  Procedure(s) Performed: RIGHT NIPPLE SPARING MASTECTOMY AND LEFT RISK REDUCING NIPPLE SPARING MASTECTOMY WITH RIGHT AXILLARY SENTINEL LYMPH NODE BIOPSY (Bilateral Breast) BILATERAL BREAST RECONSTRUCTION WITH PLACEMENT OF TISSUE EXPANDER AND FLEX HD (ACELLULAR HYDRATED DERMIS) (Bilateral Breast)  Patient Location: PACU  Anesthesia Type:General  Level of Consciousness: awake, alert , oriented and drowsy  Airway & Oxygen Therapy: Patient Spontanous Breathing and Patient connected to face mask oxygen  Post-op Assessment: Report given to RN and Post -op Vital signs reviewed and stable  Post vital signs: Reviewed and stable  Last Vitals:  Vitals Value Taken Time  BP 135/89 02/22/19 1208  Temp    Pulse 104 02/22/19 1213  Resp 16 02/22/19 1213  SpO2 99 % 02/22/19 1213  Vitals shown include unvalidated device data.  Last Pain:  Vitals:   02/22/19 0657  TempSrc: Oral  PainSc: 0-No pain         Complications: No apparent anesthesia complications

## 2019-02-22 NOTE — H&P (Signed)
Subjective:     Patient ID: [name] is a 38 y.o. female.  HPI  Here for follow up discussion breast reconstruction prior to planned bilateral mastectomies.  Presented with palpable right breast mass. MMG/US demonstrated 1.6 cm mass right UOQ 7 cmfn. Axilla normal. Biopsy showed IDC, ER/PR-, Her2+  MRI showed biopsy proven malignancy in the right breast at the 10 o'clock, 1.5 x 0.9 x 1.6 cm.  Undergoing neoadjuvant chemotherapy through mid September 2020. Participating in [facility] with heterozygous pathogenic gene mutation inPMS2, consistent with Lynch syndrome.  Current 34 B/C. Wt down 30 lb over last year by diet/exercise with goal 10 lb additional.  Works at [employer]. Accompanied by husband that works at [employer], [employer]. Two kids.   Review of Systems  Constitutional: Positive for fatigue.       + hair loss  Remainder 12 point review negative    Objective:   Physical Exam  Constitutional: She is oriented to person, place, and time.  Cardiovascular: Normal rate.  Pulmonary/Chest: Effort normal.  Abdominal: Soft.  Small to moderate soft tissue volume, striae over supra and infraumbilical abdomen  Neurological: She is alert and oriented to person, place, and time.  Skin:  Fitzpatrick 2    Pseudoptosis bilateral no palpable mass though breast tissue dense Sn to nipple R 23 L 21. 5 cm BW R 17 L 18 cm CW 12 cm Nipple to IMF R 7 L 8 cm    Assessment:     Right breast cancer UOQ ER- Neoadjuvant chemotherapy Lynch syndrome    Plan:  Plan bilateral nipple sparing mastectomies with immediate expander ADM reconstruction.   Discussed use of acellular dermis in reconstruction, cadaveric source, incorporation over several weeks, risk that if has seroma or infection can act as additional nidus for infection if not incorporated.Reviewed this is an off label use of acellular dermis.  Reviewed incisions, drains, OR length, hospital  stay and recovery/post op limitations. Discussed process of expansion and implant based risks including rupture, MRI surveillance for silicone implants, infection requiring surgery or removal, contracture. Reviewed my preference for staged reconstruction with expander. This does mean additional surgery for implant placement.  Reviewedreconstructionwill be asensate and not stimulate. Reviewed risks mastectomy flap necrosis requiring additional surgery.   Reviewedprepectoral vs sub pectoral reconstruction. Discussed with patient and benefit of this is no animation deformity, may be less pain. Risk may be more visible rippling over upper poles, greater need of ADM. Reviewed pre pectoral would require larger amount acellular dermis, more drains. Discussed any type reconstruction also risks long term displacement implant and visible rippling. If prepectoral counseled I would recommend she be comfortable with silicone implants as more options that have less rippling. Sheagrees to prepectoral placement.  Discussed risk COVID infectionthrough this elective surgery. Patient will receive COVID testing prior to surgery. Discussed even if patient receivesa negative test result, the tests in some cases may fail to detect the virus or patient maycontract COVID after the test.COVID 19 infectionbefore/during/aftersurgery may result in lead to a higher chance of complication and death.  Additional risks seroma, hematoma, damage to adjacent structures, asymmetry, DVT/PE, cardiopulmonary complications, unacceptable cosmetic result reviewed.

## 2019-02-22 NOTE — Interval H&P Note (Signed)
History and Physical Interval Note:  02/22/2019 7:08 AM  [name]  has presented today for surgery, with the diagnosis of right breast cancer, UOQ ER negative, lynch syndrome.  The various methods of treatment have been discussed with the patient and family. After consideration of risks, benefits and other options for treatment, the patient has consented to  Procedure(s) with comments: RIGHT NIPPLE SPARING MASTECTOMY AND LEFT RISK REDUCING NIPPLE SPARING MASTECTOMY WITH RIGHT AXILLARY SENTINEL LYMPH NODE BIOPSY (Bilateral) - BILATERAL PECTORAL BLOCK BILATERAL BREAST RECONSTRUCTION WITH PLACEMENT OF TISSUE EXPANDER AND FLEX HD (ACELLULAR HYDRATED DERMIS) (Bilateral) as a surgical intervention.  The patient's history has been reviewed, patient examined, no change in status, stable for surgery.  I have reviewed the patient's chart and labs.  Questions were answered to the patient's satisfaction.     Rolm Bookbinder

## 2019-02-22 NOTE — Progress Notes (Signed)
Nuc med injection performed by nuc med staff.  VS stable. No additional medication given.  Emotional support provided. Patient tolerated procedure well.

## 2019-02-22 NOTE — H&P (Signed)
[name] is an 38 y.o. female.   Chief Complaint: breast cancer HPI: 27 yof referred by [facility] for new right breast cancer earlier this year. she noted this mass a couple months ago but evaluation was delayed due to covid. she has fh only in elderly great aunt. no prior breast history. she does have longstanding expressed bilateral discharge that occasionally occurs. she underwent mm with c density breasts. there was focal asymmetry in the outer left breast and in the right breast 10 clock 7 cm from nipple there is a 1.2 cm mass. the axilla is negative. this underwent core biopsy and is a grade III IDC that is er/pr neg, her 2 pos and Ki is 20%. she has been found to have Lynch syndrome. she is undergoing primary systemic therapy and will finish next week. due to get a csc on 10/2. surgery scheduled 10/20 and has seen plastics. she is doing great. has not lost hair with dignicap. mri shows complete response  Past Medical History:  Diagnosis Date  . Anxiety    over cancer diagnosis  . Arthritis   . Breast cancer ([place])    right breast cancer chemo finished per pt  . Family history of breast cancer   . Lynch syndrome   . Scleroderma [facility])     Past Surgical History:  Procedure Laterality Date  . CERVICAL FUSION  2019  . CESAREAN SECTION  2005  . CESAREAN SECTION  02/27/2011   Procedure: CESAREAN SECTION;  Surgeon: Shon Millet II;  Location: [facility];  Service: Gynecology;  Laterality: N/A;  repeat  . PORTACATH PLACEMENT Right 10/20/2018   Procedure: INSERTION PORT-A-CATH WITH ULTRASOUND;  Surgeon: Rolm Bookbinder, MD;  Location: [facility];  Service: General;  Laterality: Right;    Family History  Problem Relation Age of Onset  . Breast cancer Other   . Breast cancer Other   . Hashimoto's thyroiditis Son   . Ulcerative colitis Niece   . Ulcerative colitis Mother   . Diabetes Mother   . Colon polyps Father   . Diabetes Father   . Rheum arthritis  Maternal Grandmother   . Heart disease Maternal Grandfather   . Breast cancer Other        paternal great grandmother  . Colon cancer Cousin        @nd  cousin  . Esophageal cancer Neg Hx   . Rectal cancer Neg Hx   . Stomach cancer Neg Hx    Social History:  reports that she has never smoked. She has never used smokeless tobacco. She reports that she does not drink alcohol or use drugs.  Allergies: No Known Allergies  Medications Prior to Admission  Medication Sig Dispense Refill  . buPROPion (WELLBUTRIN XL) 150 MG 24 hr tablet Take 1 tablet (150 mg total) by mouth daily. 30 tablet 3  . LORazepam (ATIVAN) 0.5 MG tablet Take 1 tablet (0.5 mg total) by mouth at bedtime as needed for anxiety. 30 tablet 1    No results found for this or any previous visit (from the past 48 hour(s)). No results found.  Review of Systems  All other systems reviewed and are negative.   Temperature 98.6 F (37 C), temperature source Oral, height 5' 3.25" (1.607 m), weight 78 kg. Physical Exam  Breast Nipples-No Discharge. Breast Lump-No Palpable Breast Mass. Lymphatic Head & Neck General Head & Neck Lymphatics: Bilateral - Description - Normal. Axillary General Axillary Region: Bilateral - Description - Normal.  Note: no [place] adenopathy  Assessment/Plan BREAST CANCER OF UPPER-OUTER QUADRANT OF RIGHT FEMALE BREAST (C50.411) Story: Right nsm, left risk reducing nsm, right ax sn biopsy with immediate recon     Rolm Bookbinder, MD 02/22/2019, 7:06 AM

## 2019-02-22 NOTE — Op Note (Signed)
Operative Note   DATE OF OPERATION: 10.20.20  LOCATION: [facility]-observation  SURGICAL DIVISION: Plastic Surgery  PREOPERATIVE DIAGNOSES:  1. Right breast cancer UOQ ER- 2. Lynch syndrome  POSTOPERATIVE DIAGNOSES:  same  PROCEDURE:  1. Bilateral breast reconstruction with tissue expanders 2. Acellular dermis (Alloderm) for breast reconstruction 550 cm2  SURGEON: Irene Limbo MD MBA  ASSISTANT: none  ANESTHESIA:  General.   EBL: 500 ml for entire procedure  COMPLICATIONS: None immediate.   INDICATIONS FOR PROCEDURE:  The patient, [name], is a 38 y.o. female born on 07-29-1980, is here for bilateral prepectoral breast reconstruction with expanders following nipple sparing mastectomies. She has completed neoadjuvant chemotherapy.   FINDINGS: Natrelle [id number] 300 ml tissue expanders placed bilateral initial fill volume 220 ml air. RIGHT SN [id number]  DESCRIPTION OF PROCEDURE:  The patient was marked with the patient in the preoperative area to mark sternal notch, chest midline, anterior axillary lines and inframammary folds.Following induction,Foley catheter placed. The patient's operative site was prepped and draped in a sterile fashion. A time out was performed and all information was confirmed to be correct. I assisted in mastectomies and sentinel node with exposure and retraction. Following completion of mastectomies, reconstruction began onleftside.  The cavity was irrigated with saline followed by saline solution containing Ancef, gentamicin, and bacitracin. Hemostasis was ensured.  A 19 Fr drain was placed in subcutaneous position laterally and a 15 Fr drain placed along inframammary fold. Each secured to skin with 2-0 nylon. Cavity irrigated with Betadine saline solution. The tissue expanders were prepared on back table prior in insertion. The expander was filled with air.Perforated acellular dermiswasdraped over anterior surface  expander. The ADM was then secured to itself over posterior surface of expanderwith 4-0 chromic. Redundant folds acellular dermis excised so that the ADM layflat without folds over air filled expander.The expander was secured tofascia over lateral sternal borderwith a 0 vicryl. Thelateral tab wasalso secured to pectoralis muscle with 0-vicryl. The ADM was secured to pectoralis muscle and chest wall along inferior border at inframammary foldwith 0 V-lock suture.Laterally the mastectomy flap over posterior axillary line was advanced anteriorly and the subcutaneous tissue and superficial fascia was secured to pectoralis and serratus muscles with 0-vicryl. Skin closure completedwith 3-0 vicryl in fascial layer and 4-0 vicryl in dermis. Skin closure completed with 4-0 monocryl subcuticular and tissue adhesive.  I then directed my attention to rightchest where similar irrigation and drain placement completed. The prepared expander with ADM secured over anterior surface was placed in left chest and tabs secured to chest wall and pectoralis muscle with 0- vicryl suture. The acellular dermis at inframammary fold was secured to chest wall with 0 V-lock suture.Laterally the mastectomy flap over posterior axillary line was advanced anteriorly and the subcutaneous tissue and superficial fascia was secured to pectoralis and serratus muscles with 0-vicryl. Skin closure completedwith 3-0 vicryl in fascial layer and 4-0 vicryl in dermis. Skin closure completed with 4-0 monocryl subcuticular and tissue adhesive.Patient brought to sitting position.The mastectomy flaps were redraped so that NAC was symmetric from sternal notch and chest midline. Patient returned to supine position.Tegaderm dressings applied followed bydry dressing,breast binder.  The patient was allowed to wake from anesthesia, extubated and taken to the recovery room in satisfactory condition.   SPECIMENS: none  DRAINS: 15 and 19 Fr JP in  right and left breast reconstrution  Irene Limbo, MD [facility] Plastic & Reconstructive Surgery

## 2019-02-22 NOTE — Anesthesia Postprocedure Evaluation (Signed)
Anesthesia Post Note  Patient: [name]  Procedure(s) Performed: RIGHT NIPPLE SPARING MASTECTOMY AND LEFT RISK REDUCING NIPPLE SPARING MASTECTOMY WITH RIGHT AXILLARY SENTINEL LYMPH NODE BIOPSY (Bilateral Breast) BILATERAL BREAST RECONSTRUCTION WITH PLACEMENT OF TISSUE EXPANDER AND FLEX HD (ACELLULAR HYDRATED DERMIS) (Bilateral Breast)     Patient location during evaluation: PACU Anesthesia Type: General Level of consciousness: awake and alert Pain management: pain level controlled Vital Signs Assessment: post-procedure vital signs reviewed and stable Respiratory status: spontaneous breathing, nonlabored ventilation, respiratory function stable and patient connected to nasal cannula oxygen Cardiovascular status: blood pressure returned to baseline and stable Postop Assessment: no apparent nausea or vomiting Anesthetic complications: no    Last Vitals:  Vitals:   02/22/19 1245 02/22/19 1315  BP: 123/89 104/72  Pulse: 87 86  Resp: (!) 9 16  Temp:  37.6 C  SpO2: 100% 98%    Last Pain:  Vitals:   02/22/19 1315  TempSrc:   PainSc: 3                  Montez Hageman

## 2019-02-22 NOTE — Anesthesia Procedure Notes (Signed)

## 2019-02-22 NOTE — Anesthesia Preprocedure Evaluation (Signed)
Anesthesia Evaluation  Patient identified by MRN, date of birth, ID band Patient awake    Reviewed: Allergy & Precautions, NPO status , Patient's Chart, lab work & pertinent test results  Airway Mallampati: II  TM Distance: >3 FB Neck ROM: Full    Dental no notable dental hx.    Pulmonary neg pulmonary ROS,    Pulmonary exam normal breath sounds clear to auscultation       Cardiovascular negative cardio ROS Normal cardiovascular exam Rhythm:Regular Rate:Normal     Neuro/Psych negative neurological ROS  negative psych ROS   GI/Hepatic negative GI ROS, Neg liver ROS,   Endo/Other  negative endocrine ROS  Renal/GU negative Renal ROS  negative genitourinary   Musculoskeletal negative musculoskeletal ROS (+)   Abdominal   Peds negative pediatric ROS (+)  Hematology negative hematology ROS (+)   Anesthesia Other Findings Scleroderma  Reproductive/Obstetrics negative OB ROS                             Anesthesia Physical Anesthesia Plan  ASA: II  Anesthesia Plan: General   Post-op Pain Management:  Regional for Post-op pain   Induction: Intravenous  PONV Risk Score and Plan: 3 and Ondansetron, Dexamethasone, Midazolam and Treatment may vary due to age or medical condition  Airway Management Planned: Oral ETT  Additional Equipment:   Intra-op Plan:   Post-operative Plan: Extubation in OR  Informed Consent: I have reviewed the patients History and Physical, chart, labs and discussed the procedure including the risks, benefits and alternatives for the proposed anesthesia with the patient or authorized representative who has indicated his/her understanding and acceptance.     Dental advisory given  Plan Discussed with: CRNA  Anesthesia Plan Comments:         Anesthesia Quick Evaluation

## 2019-02-23 ENCOUNTER — Encounter (HOSPITAL_BASED_OUTPATIENT_CLINIC_OR_DEPARTMENT_OTHER): Payer: Self-pay | Admitting: General Surgery

## 2019-02-23 DIAGNOSIS — Z171 Estrogen receptor negative status [ER-]: Secondary | ICD-10-CM | POA: Diagnosis not present

## 2019-02-23 DIAGNOSIS — Z9221 Personal history of antineoplastic chemotherapy: Secondary | ICD-10-CM | POA: Diagnosis not present

## 2019-02-23 DIAGNOSIS — Z803 Family history of malignant neoplasm of breast: Secondary | ICD-10-CM | POA: Diagnosis not present

## 2019-02-23 DIAGNOSIS — M1990 Unspecified osteoarthritis, unspecified site: Secondary | ICD-10-CM | POA: Diagnosis not present

## 2019-02-23 DIAGNOSIS — C50411 Malignant neoplasm of upper-outer quadrant of right female breast: Secondary | ICD-10-CM | POA: Diagnosis not present

## 2019-02-23 DIAGNOSIS — Z79899 Other long term (current) drug therapy: Secondary | ICD-10-CM | POA: Diagnosis not present

## 2019-02-23 DIAGNOSIS — F419 Anxiety disorder, unspecified: Secondary | ICD-10-CM | POA: Diagnosis not present

## 2019-02-23 DIAGNOSIS — Z1509 Genetic susceptibility to other malignant neoplasm: Secondary | ICD-10-CM | POA: Diagnosis not present

## 2019-02-23 NOTE — Discharge Summary (Signed)
Physician Discharge Summary  Patient ID: [name] MRN: [id number] DOB/AGE: 10-05-1980 38 y.o.  Admit date: 02/22/2019 Discharge date: 02/23/2019  Admission Diagnoses: Right breast cancer, Lynch syndrom  Discharge Diagnoses:  same  Discharged Condition: stable  Hospital Course: Post operatively patient did well with pain controlled tolerating diet and ambulatory with minimal assist. Instructed on drain care and incisions.  Treatments: surgery: bilateral nipple sparing mastectomies right sentinel node bilateral breast reconstruction with tissue expanders acellular dermis 10.20.20  Discharge Exam: Blood pressure (!) 95/55, pulse 92, temperature 98.8 F (37.1 C), resp. rate 18, height 5\' 3"  (1.6 m), weight 79.7 kg, SpO2 97 %. Incision/Wound: chest soft incisions dry intact Tegaderms in place drains serosanguinous  Disposition: Discharge disposition: 01-Home or Self Care       Discharge Instructions    Call MD for:  redness, tenderness, or signs of infection (pain, swelling, bleeding, redness, odor or green/yellow discharge around incision site)   Complete by: As directed    Call MD for:  temperature >100.5   Complete by: As directed    Discharge instructions   Complete by: As directed    Ok to remove dressings and shower am 10.22.20. Soap and water ok, pat Tegaderms dry. Do not remove Tegaderms. No creams or ointments over incisions. Do not let drains dangle in shower, attach to lanyard or similar.Strip and record drains twice daily and bring log to clinic visit.  Breast binder or soft compression bra all other times.  Ok to raise arms above shoulders for bathing and dressing.  No house yard work or exercise until cleared by MD.   Recommend ibuprofen with meals as directed to aid with pain control. Also ok to use Tylenol for pain. Recommend Miralax or Dulcolax as needed for constipation.   Driving Restrictions   Complete by: As directed    No driving for 2 weeks then  no driving if taking narcotics   Lifting restrictions   Complete by: As directed    No lifting > 5 lbs until cleared by MD   Resume previous diet   Complete by: As directed      Allergies as of 02/23/2019   No Known Allergies     Medication List    TAKE these medications   buPROPion 150 MG 24 hr tablet Commonly known as: WELLBUTRIN XL Take 1 tablet (150 mg total) by mouth daily.   LORazepam 0.5 MG tablet Commonly known as: ATIVAN Take 1 tablet (0.5 mg total) by mouth at bedtime as needed for anxiety.   methocarbamol 500 MG tablet Commonly known as: ROBAXIN Take 1 tablet (500 mg total) by mouth every 8 (eight) hours as needed for muscle spasms.   oxyCODONE 5 MG immediate release tablet Commonly known as: Oxy IR/ROXICODONE Take 1 tablet (5 mg total) by mouth every 4 (four) hours as needed for moderate pain.   sulfamethoxazole-trimethoprim 800-160 MG tablet Commonly known as: BACTRIM DS Take 1 tablet by mouth 2 (two) times daily.      Follow-up Information    Irene Limbo, MD In 1 week.   Specialty: Plastic Surgery Why: as scheduled Contact information: [place] [address] [phone]        Rolm Bookbinder, MD. Schedule an appointment as soon as possible for a visit in 2 weeks.   Specialty: General Surgery Contact information: [place] [address] [phone]           Signed: Irene Limbo  02/23/2019, 7:28 AM

## 2019-02-23 NOTE — Discharge Instructions (Signed)
°Post Anesthesia Home Care Instructions ° °Activity: °Get plenty of rest for the remainder of the day. A responsible individual must stay with you for 24 hours following the procedure.  °For the next 24 hours, DO NOT: °-Drive a car °-Operate machinery °-Drink alcoholic beverages °-Take any medication unless instructed by your physician °-Make any legal decisions or sign important papers. ° °Meals: °Start with liquid foods such as gelatin or soup. Progress to regular foods as tolerated. Avoid greasy, spicy, heavy foods. If nausea and/or vomiting occur, drink only clear liquids until the nausea and/or vomiting subsides. Call your physician if vomiting continues. ° °Special Instructions/Symptoms: °Your throat may feel dry or sore from the anesthesia or the breathing tube placed in your throat during surgery. If this causes discomfort, gargle with warm salt water. The discomfort should disappear within 24 hours. ° °If you had a scopolamine patch placed behind your ear for the management of post- operative nausea and/or vomiting: ° °1. The medication in the patch is effective for 72 hours, after which it should be removed.  Wrap patch in a tissue and discard in the trash. Wash hands thoroughly with soap and water. °2. You may remove the patch earlier than 72 hours if you experience unpleasant side effects which may include dry mouth, dizziness or visual disturbances. °3. Avoid touching the patch. Wash your hands with soap and water after contact with the patch. °   Regional Anesthesia Blocks ° °1. Numbness or the inability to move the "blocked" extremity may last from 3-48 hours after placement. The length of time depends on the medication injected and your individual response to the medication. If the numbness is not going away after 48 hours, call your surgeon. ° °2. The extremity that is blocked will need to be protected until the numbness is gone and the  Strength has returned. Because you cannot feel it, you will  need to take extra care to avoid injury. Because it may be weak, you may have difficulty moving it or using it. You may not know what position it is in without looking at it while the block is in effect. ° °3. For blocks in the legs and feet, returning to weight bearing and walking needs to be done carefully. You will need to wait until the numbness is entirely gone and the strength has returned. You should be able to move your leg and foot normally before you try and bear weight or walk. You will need someone to be with you when you first try to ensure you do not fall and possibly risk injury. ° °4. Bruising and tenderness at the needle site are common side effects and will resolve in a few days. ° °5. Persistent numbness or new problems with movement should be communicated to the surgeon or the [facility] ([phone])/ [facility] ([phone]).  ° ° °About my Jackson-Pratt Bulb Drain ° °What is a Jackson-Pratt bulb? °A Jackson-Pratt is a soft, round device used to collect drainage. It is connected to a long, thin drainage catheter, which is held in place by one or two small stiches near your surgical incision site. When the bulb is squeezed, it forms a vacuum, forcing the drainage to empty into the bulb. ° °Emptying the Jackson-Pratt bulb- °To empty the bulb: °1. Release the plug on the top of the bulb. °2. Pour the bulb's contents into a measuring container which your nurse will provide. °3. Record the time emptied and amount of drainage.   Empty the drain(s) as often as your     doctor or nurse recommends. ° °Date                  Time                    Amount (Drain 1)                 Amount (Drain  2) ° °_____________________________________________________________________ ° °_____________________________________________________________________ ° °_____________________________________________________________________ ° °_____________________________________________________________________ ° °_____________________________________________________________________ ° °_____________________________________________________________________ ° °_____________________________________________________________________ ° °_____________________________________________________________________ ° °Squeezing the Jackson-Pratt Bulb- °To squeeze the bulb: °1. Make sure the plug at the top of the bulb is open. °2. Squeeze the bulb tightly in your fist. You will hear air squeezing from the bulb. °3. Replace the plug while the bulb is squeezed. °4. Use a safety pin to attach the bulb to your clothing. This will keep the catheter from     pulling at the bulb insertion site. ° °When to call your doctor- °Call your doctor if: °Drain site becomes red, swollen or hot. °You have a fever greater than 101 degrees F. °There is oozing at the drain site. °Drain falls out (apply a guaze bandage over the drain hole and secure it with tape). °Drainage increases daily not related to activity patterns. (You will usually have more drainage when you are active than when you are resting.) °Drainage has a bad odor. °  °

## 2019-02-24 ENCOUNTER — Encounter: Payer: Self-pay | Admitting: *Deleted

## 2019-02-24 DIAGNOSIS — C50911 Malignant neoplasm of unspecified site of right female breast: Secondary | ICD-10-CM | POA: Diagnosis not present

## 2019-02-24 DIAGNOSIS — Z9012 Acquired absence of left breast and nipple: Secondary | ICD-10-CM | POA: Diagnosis not present

## 2019-02-24 LAB — SURGICAL PATHOLOGY

## 2019-02-25 ENCOUNTER — Other Ambulatory Visit: Payer: Self-pay

## 2019-02-25 DIAGNOSIS — Z171 Estrogen receptor negative status [ER-]: Secondary | ICD-10-CM

## 2019-02-25 DIAGNOSIS — C50411 Malignant neoplasm of upper-outer quadrant of right female breast: Secondary | ICD-10-CM

## 2019-02-25 NOTE — Progress Notes (Signed)
Echocardiogram orders placed.  

## 2019-03-01 ENCOUNTER — Telehealth: Payer: Self-pay

## 2019-03-01 NOTE — Telephone Encounter (Signed)
Pt called to report that she cancelled upcoming echocardiogram due to being 1 week post op from mastectomy.  Will await to schedule echocardiogram due to post op.

## 2019-03-02 ENCOUNTER — Other Ambulatory Visit: Payer: Self-pay

## 2019-03-02 DIAGNOSIS — Z171 Estrogen receptor negative status [ER-]: Secondary | ICD-10-CM

## 2019-03-02 DIAGNOSIS — C50411 Malignant neoplasm of upper-outer quadrant of right female breast: Secondary | ICD-10-CM

## 2019-03-02 NOTE — Progress Notes (Signed)
Patient Care Team: [name], Teresa Kirks, PA-C as PCP - General (Nurse Practitioner)  DIAGNOSIS:    ICD-10-CM   1. Malignant neoplasm of upper-outer quadrant of right breast in female, estrogen receptor negative ([place])  C50.411    Z17.1     SUMMARY OF ONCOLOGIC HISTORY: Oncology History  Malignant neoplasm of upper-outer quadrant of right breast in female, estrogen receptor negative ([place])  10/11/2018 Initial Diagnosis   Patient palpated a right breast mass, mammogram revealed 1.6 cm mass, biopsy revealed grade 3 invasive ductal carcinoma ER 0%, PR 0%, Ki-67 20%, HER-2 3+ positive, T1CN0 stage IA   10/19/2018 Cancer Staging   Staging form: Breast, AJCC 8th Edition - Clinical stage from 10/19/2018: Stage IA (cT1c, cN0, cM0, G3, ER-, PR-, HER2+) - Signed by [name], MD on 10/19/2018   10/29/2018 - 01/26/2019 Chemotherapy   The patient had trastuzumab (HERCEPTIN) 300 mg in sodium chloride 0.9 % 250 mL chemo infusion, 294 mg, Intravenous,  Once, 2 of 2 cycles Administration: 300 mg (10/29/2018), 150 mg (11/03/2018), 150 mg (12/02/2018), 150 mg (11/12/2018), 150 mg (11/25/2018), 150 mg (12/09/2018) PACLitaxel (TAXOL) 150 mg in sodium chloride 0.9 % 250 mL chemo infusion (</= 65m/m2), 80 mg/m2 = 150 mg, Intravenous,  Once, 3 of 3 cycles Dose modification: 65 mg/m2 (original dose 80 mg/m2, Cycle 3, Reason: Dose not tolerated) Administration: 150 mg (10/29/2018), 150 mg (11/03/2018), 150 mg (12/02/2018), 150 mg (11/12/2018), 150 mg (11/25/2018), 150 mg (12/09/2018), 150 mg (12/16/2018), 150 mg (12/23/2018), 120 mg (12/30/2018), 120 mg (01/06/2019), 120 mg (01/13/2019), 120 mg (01/20/2019) trastuzumab-dkst (OGIVRI) 150 mg in sodium chloride 0.9 % 250 mL chemo infusion, 150 mg (100 % of original dose 150 mg), Intravenous,  Once, 2 of 2 cycles Dose modification: 150 mg (original dose 150 mg, Cycle 2, Reason: Other (see comments), Comment: Biosimilar Conversion), 450 mg (original dose 150 mg, Cycle 3, Reason: Other (see  comments), Comment: Biosimilar Conversion) Administration: 150 mg (12/16/2018), 150 mg (12/23/2018), 150 mg (12/30/2018), 150 mg (01/06/2019), 150 mg (01/13/2019), 450 mg (01/20/2019)  for chemotherapy treatment.    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  The Common Hereditary Gene Panel offered by [facility] includes sequencing and/or deletion duplication testing of the following 48 genes: APC, ATM, AXIN2, BARD1, BMPR1A, BRCA1, BRCA2, BRIP1, CDH1, CDK4, CDKN2A (p14ARF), CDKN2A (p16INK4a), CHEK2, CTNNA1, DICER1, EPCAM (Deletion/duplication testing only), GREM1 (promoter region deletion/duplication testing only), KIT, MEN1, MLH1, MSH2, MSH3, MSH6, MUTYH, NBN, NF1, NHTL1, PALB2, PDGFRA, PMS2, POLD1, POLE, PTEN, RAD50, RAD51C, RAD51D, RNF43, SDHB, SDHC, SDHD, SMAD4, SMARCA4. STK11, TP53, TSC1, TSC2, and VHL.  The following genes were evaluated for sequence changes only: SDHA and HOXB13 c.251G>A variant only.   Two VUS's also found - one in ATM c.133C>T and the other in POLE c.1280C>T.  These will not affect medical management. The report date is November 12, 2018.   02/10/2019 -  Chemotherapy   The patient had trastuzumab-dkst (OGIVRI) 462 mg in sodium chloride 0.9 % 250 mL chemo infusion, 6 mg/kg = 630 mg, Intravenous,  Once, 1 of 12 cycles  for chemotherapy treatment.    02/22/2019 Surgery   Bilateral mastectomies ([name]& [name]) Left breast: benign tissue and no evidence of malignancy Right breast: no residual carcinoma, 2 right axillary lymph nodes negative      CHIEF COMPLIANT: Follow-up s/p bilateral mastectomies to review pathology  INTERVAL HISTORY: Teresa ARROYOis a 38y.o. with above-mentioned history of right  breast cancer who competed neoadjuvant chemotherapy. She underwent bilateral mastectomies on 02/22/19 with Dr. [name] and Dr. Iran Planas for which pathology showed in the left breast, benign tissue and  no evidence of malignancy, and in the right breast, no residual carcinoma, with 2 right axillary lymph nodes negative for carcinoma. She is a participant in the UpBeat clinical trial.She presents to the clinic todayto review the pathology report and discuss further tretmtent.   REVIEW OF SYSTEMS:   Constitutional: Denies fevers, chills or abnormal weight loss Eyes: Denies blurriness of vision Ears, nose, mouth, throat, and face: Denies mucositis or sore throat Respiratory: Denies cough, dyspnea or wheezes Cardiovascular: Denies palpitation, chest discomfort Gastrointestinal: Denies nausea, heartburn or change in bowel habits Skin: Denies abnormal skin rashes Lymphatics: Denies new lymphadenopathy or easy bruising Neurological: Denies numbness, tingling or new weaknesses Behavioral/Psych: Mood is stable, no new changes  Extremities: No lower extremity edema Breast: s/p bilateral mastectomies with reconstruction  All other systems were reviewed with the patient and are negative.  I have reviewed the past medical history, past surgical history, social history and family history with the patient and they are unchanged from previous note.  ALLERGIES:  has No Known Allergies.  MEDICATIONS:  Current Outpatient Medications  Medication Sig Dispense Refill  . buPROPion (WELLBUTRIN XL) 150 MG 24 hr tablet Take 1 tablet (150 mg total) by mouth daily. 30 tablet 3  . LORazepam (ATIVAN) 0.5 MG tablet Take 1 tablet (0.5 mg total) by mouth at bedtime as needed for anxiety. 30 tablet 1  . methocarbamol (ROBAXIN) 500 MG tablet Take 1 tablet (500 mg total) by mouth every 8 (eight) hours as needed for muscle spasms. 30 tablet 0  . oxyCODONE (OXY IR/ROXICODONE) 5 MG immediate release tablet Take 1 tablet (5 mg total) by mouth every 4 (four) hours as needed for moderate pain. 40 tablet 0  . sulfamethoxazole-trimethoprim (BACTRIM DS) 800-160 MG tablet Take 1 tablet by mouth 2 (two) times daily. 12 tablet 0    No current facility-administered medications for this visit.     PHYSICAL EXAMINATION: ECOG PERFORMANCE STATUS: 1 - Symptomatic but completely ambulatory  Vitals:   03/03/19 1042  BP: 110/83  Pulse: 78  Resp: 18  Temp: 98.5 F (36.9 C)  SpO2: 99%   Filed Weights   03/03/19 1042  Weight: 179 lb 11.2 oz (81.5 kg)    GENERAL: alert, no distress and comfortable SKIN: skin color, texture, turgor are normal, no rashes or significant lesions EYES: normal, Conjunctiva are pink and non-injected, sclera clear OROPHARYNX: no exudate, no erythema and lips, buccal mucosa, and tongue normal  NECK: supple, thyroid normal size, non-tender, without nodularity LYMPH: no palpable lymphadenopathy in the cervical, axillary or inguinal LUNGS: clear to auscultation and percussion with normal breathing effort HEART: regular rate & rhythm and no murmurs and no lower extremity edema ABDOMEN: abdomen soft, non-tender and normal bowel sounds MUSCULOSKELETAL: no cyanosis of digits and no clubbing  NEURO: alert & oriented x 3 with fluent speech, no focal motor/sensory deficits EXTREMITIES: No lower extremity edema  LABORATORY DATA:  I have reviewed the data as listed CMP Latest Ref Rng & Units 03/03/2019 01/20/2019 01/13/2019  Glucose 70 - 99 mg/dL 79 99 105(H)  BUN 6 - 20 mg/dL _0 Creatinine 0.44 - 1.00 mg/dL 0.76 0.81 0.80  Sodium 135 - 145 mmol/L 140 141 144  Potassium 3.5 - 5.1 mmol/L 4.7 4.3 4.2  Chloride 98 - 111 mmol/L 107 107 108  CO2 22 - 32 mmol/L _0 Calcium 8.9 - 10.3 mg/dL 8.8(L) 9.4 9.0  Total Protein 6.5 - 8.1 g/dL 6.5 7.0 6.6  Total Bilirubin 0.3 - 1.2 mg/dL <0.2(L) 0.3 <0.2(L)  Alkaline Phos 38 - 126 U/L 72 68 68  AST 15 - 41 U/L 34 11(L) 11(L)  ALT 0 - 44 U/L 81(H) 13 10    Lab Results  Component Value Date   WBC 6.3 03/03/2019   HGB 10.4 (L) 03/03/2019   HCT 31.6 (L) 03/03/2019   MCV 96.3 03/03/2019   PLT 318 03/03/2019   NEUTROABS 4.3 03/03/2019     ASSESSMENT & PLAN:  Malignant neoplasm of upper-outer quadrant of right breast in female, estrogen receptor negative ([place]) 10/11/2018:Patient palpated a right breast mass, mammogram revealed 1.6 cm mass, biopsy revealed grade 3 invasive ductal carcinoma ER 0%, PR 0%, Ki-67 20%, HER-2 3+ positive, T1CN0 stage IA  Treatment plan: 1. Neoadjuvant Taxol and Herceptin weekly x12. Followed by Herceptin maintenance therapy every 3 weeks to complete 1 year.  10/29/2018-01/26/2019 2.Breast conserving surgery with sentinel lymph node biopsy 02/22/2019:Bilateral mastectomies [name] & [name]) Left breast: benign tissue and no evidence of malignancy Right breast: no residual carcinoma, 2 right axillary lymph nodes negative  3.Adjuvant radiation therapy Upbeat clinical trial: No adverse effects from participating in the trial Lynch syndrome positive on genetic testing (I discussed with her about aspirin 600 mg for prevention of colon cancer risk: Study published in [place]) ---------------------------------------------------------------------------------------------------------------------------------------- Pathology counseling: I discussed the final pathology report of the patient provided  a copy of this report. I discussed the margins as well as lymph node surgeries. We also discussed the final staging along with previously performed ER/PR and HER-2/neu testing.  Chemo-induced peripheral neuropathy: Reduced the dosage of cycle 9 Taxol, currently stable Depression/anxiety/panic attacks: On Wellbutrin.  Treatment plan: Continue Herceptin maintenance.   No orders of the defined types were placed in this encounter.  The patient has a good understanding of the overall plan. she agrees with it. she will call with any problems that may develop before the next visit here.  [name], MD 03/03/2019  Julious Oka Dorshimer am acting as scribe for Dr. [name].  I have reviewed the above  documentation for accuracy and completeness, and I agree with the above.

## 2019-03-03 ENCOUNTER — Inpatient Hospital Stay: Payer: BC Managed Care – PPO

## 2019-03-03 ENCOUNTER — Other Ambulatory Visit: Payer: Self-pay

## 2019-03-03 ENCOUNTER — Inpatient Hospital Stay (HOSPITAL_BASED_OUTPATIENT_CLINIC_OR_DEPARTMENT_OTHER): Payer: BC Managed Care – PPO | Admitting: Hematology and Oncology

## 2019-03-03 DIAGNOSIS — C50411 Malignant neoplasm of upper-outer quadrant of right female breast: Secondary | ICD-10-CM

## 2019-03-03 DIAGNOSIS — Z5112 Encounter for antineoplastic immunotherapy: Secondary | ICD-10-CM | POA: Diagnosis not present

## 2019-03-03 DIAGNOSIS — Z171 Estrogen receptor negative status [ER-]: Secondary | ICD-10-CM

## 2019-03-03 LAB — CMP (CANCER CENTER ONLY)
ALT: 81 U/L — ABNORMAL HIGH (ref 0–44)
AST: 34 U/L (ref 15–41)
Albumin: 3.4 g/dL — ABNORMAL LOW (ref 3.5–5.0)
Alkaline Phosphatase: 72 U/L (ref 38–126)
Anion gap: 8 (ref 5–15)
BUN: 12 mg/dL (ref 6–20)
CO2: 25 mmol/L (ref 22–32)
Calcium: 8.8 mg/dL — ABNORMAL LOW (ref 8.9–10.3)
Chloride: 107 mmol/L (ref 98–111)
Creatinine: 0.76 mg/dL (ref 0.44–1.00)
GFR, Est AFR Am: >60 mL/min (ref >60)
GFR, Estimated: >60 mL/min (ref >60)
Glucose, Bld: 79 mg/dL (ref 70–99)
Potassium: 4.7 mmol/L (ref 3.5–5.1)
Sodium: 140 mmol/L (ref 135–145)
Total Bilirubin: <0.2 mg/dL — ABNORMAL LOW (ref 0.3–1.2)
Total Protein: 6.5 g/dL (ref 6.5–8.1)

## 2019-03-03 LAB — CBC WITH DIFFERENTIAL (CANCER CENTER ONLY)
Abs Immature Granulocytes: 0.03 10*3/uL (ref 0.00–0.07)
Basophils Absolute: 0 10*3/uL (ref 0.0–0.1)
Basophils Relative: 1 %
Eosinophils Absolute: 0.2 10*3/uL (ref 0.0–0.5)
Eosinophils Relative: 3 %
HCT: 31.6 % — ABNORMAL LOW (ref 36.0–46.0)
Hemoglobin: 10.4 g/dL — ABNORMAL LOW (ref 12.0–15.0)
Immature Granulocytes: 1 %
Lymphocytes Relative: 20 %
Lymphs Abs: 1.3 10*3/uL (ref 0.7–4.0)
MCH: 31.7 pg (ref 26.0–34.0)
MCHC: 32.9 g/dL (ref 30.0–36.0)
MCV: 96.3 fL (ref 80.0–100.0)
Monocytes Absolute: 0.4 10*3/uL (ref 0.1–1.0)
Monocytes Relative: 7 %
Neutro Abs: 4.3 10*3/uL (ref 1.7–7.7)
Neutrophils Relative %: 68 %
Platelet Count: 318 10*3/uL (ref 150–400)
RBC: 3.28 MIL/uL — ABNORMAL LOW (ref 3.87–5.11)
RDW: 12.9 % (ref 11.5–15.5)
WBC Count: 6.3 10*3/uL (ref 4.0–10.5)
nRBC: 0 % (ref 0.0–0.2)

## 2019-03-03 MED ORDER — SODIUM CHLORIDE 0.9 % IV SOLN
Freq: Once | INTRAVENOUS | Status: AC
  Administered 2019-03-03: 12:00:00 via INTRAVENOUS
  Filled 2019-03-03: qty 250

## 2019-03-03 MED ORDER — DIPHENHYDRAMINE HCL 25 MG PO CAPS
ORAL_CAPSULE | ORAL | Status: AC
  Filled 2019-03-03: qty 2

## 2019-03-03 MED ORDER — DIPHENHYDRAMINE HCL 25 MG PO CAPS
50.0000 mg | ORAL_CAPSULE | Freq: Once | ORAL | Status: AC
  Administered 2019-03-03: 50 mg via ORAL

## 2019-03-03 MED ORDER — HEPARIN SOD (PORK) LOCK FLUSH 100 UNIT/ML IV SOLN
500.0000 [IU] | Freq: Once | INTRAVENOUS | Status: AC | PRN
  Administered 2019-03-03: 500 [IU]
  Filled 2019-03-03: qty 5

## 2019-03-03 MED ORDER — SODIUM CHLORIDE 0.9% FLUSH
10.0000 mL | INTRAVENOUS | Status: DC | PRN
  Administered 2019-03-03: 10 mL
  Filled 2019-03-03: qty 10

## 2019-03-03 MED ORDER — ACETAMINOPHEN 325 MG PO TABS
ORAL_TABLET | ORAL | Status: AC
  Filled 2019-03-03: qty 2

## 2019-03-03 MED ORDER — TRASTUZUMAB-DKST CHEMO 150 MG IV SOLR
450.0000 mg | Freq: Once | INTRAVENOUS | Status: AC
  Administered 2019-03-03: 450 mg via INTRAVENOUS
  Filled 2019-03-03: qty 21.43

## 2019-03-03 MED ORDER — ACETAMINOPHEN 325 MG PO TABS
650.0000 mg | ORAL_TABLET | Freq: Once | ORAL | Status: AC
  Administered 2019-03-03: 650 mg via ORAL

## 2019-03-03 NOTE — Assessment & Plan Note (Signed)
10/11/2018:Patient palpated a right breast mass, mammogram revealed 1.6 cm mass, biopsy revealed grade 3 invasive ductal carcinoma ER 0%, PR 0%, Ki-67 20%, HER-2 3+ positive, T1CN0 stage IA  Treatment plan: 1. Neoadjuvant Taxol and Herceptin weekly x12. Followed by Herceptin maintenance therapy every 3 weeks to complete 1 year.  10/29/2018-01/26/2019 2.Breast conserving surgery with sentinel lymph node biopsy 02/22/2019:Bilateral mastectomies [name] & [name]) Left breast: benign tissue and no evidence of malignancy Right breast: no residual carcinoma, 2 right axillary lymph nodes negative  3.Adjuvant radiation therapy Upbeat clinical trial: No adverse effects from participating in the trial Lynch syndrome positive on genetic testing (I discussed with her about aspirin 600 mg for prevention of colon cancer risk: Study published in [place]) ---------------------------------------------------------------------------------------------------------------------------------------- Pathology counseling: I discussed the final pathology report of the patient provided  a copy of this report. I discussed the margins as well as lymph node surgeries. We also discussed the final staging along with previously performed ER/PR and HER-2/neu testing.  Chemo-induced peripheral neuropathy: Reduced the dosage of cycle 9 Taxol, currently stable Depression/anxiety/panic attacks: On Wellbutrin.  Treatment plan: Continue Herceptin maintenance.  We will plan to give subcu Herceptin

## 2019-03-03 NOTE — Patient Instructions (Signed)

## 2019-03-03 NOTE — Patient Instructions (Signed)
[facility] Discharge Instructions for Patients Receiving Chemotherapy  Today you received the following chemotherapy agents Trastuzumab (OGIVIRI).  To help prevent nausea and vomiting after your treatment, we encourage you to take your nausea medication as prescribed.   If you develop nausea and vomiting that is not controlled by your nausea medication, call the clinic.   BELOW ARE SYMPTOMS THAT SHOULD BE REPORTED IMMEDIATELY:  *FEVER GREATER THAN 100.5 F  *CHILLS WITH OR WITHOUT FEVER  NAUSEA AND VOMITING THAT IS NOT CONTROLLED WITH YOUR NAUSEA MEDICATION  *UNUSUAL SHORTNESS OF BREATH  *UNUSUAL BRUISING OR BLEEDING  TENDERNESS IN MOUTH AND THROAT WITH OR WITHOUT PRESENCE OF ULCERS  *URINARY PROBLEMS  *BOWEL PROBLEMS  UNUSUAL RASH Items with * indicate a potential emergency and should be followed up as soon as possible.  Feel free to call the clinic should you have any questions or concerns. The clinic phone number is [phone].  Please show the [facility] at check-in to the Emergency Department and triage nurse.  Coronavirus (COVID-19) Are you at risk?  Are you at risk for the Coronavirus (COVID-19)?  To be considered HIGH RISK for Coronavirus (COVID-19), you have to meet the following criteria:  . Traveled to [place], [place], [place], [place] or [place]; or in the [place] to [place], [place], [place], or [place]; and have fever, cough, and shortness of breath within the last 2 weeks of travel OR . Been in close contact with a person diagnosed with COVID-19 within the last 2 weeks and have fever, cough, and shortness of breath . IF YOU DO NOT MEET THESE CRITERIA, YOU ARE CONSIDERED LOW RISK FOR COVID-19.  What to do if you are HIGH RISK for COVID-19?  Marland Kitchen If you are having a medical emergency, call 911. . Seek medical care right away. Before you go to a doctor's office, urgent care or emergency department, call ahead and tell them  about your recent travel, contact with someone diagnosed with COVID-19, and your symptoms. You should receive instructions from your physician's office regarding next steps of care.  . When you arrive at healthcare provider, tell the healthcare staff immediately you have returned from visiting [place], [place], [place], [place] or [place]; or traveled in the [place] to [place], [place], [place], or [place]; in the last two weeks or you have been in close contact with a person diagnosed with COVID-19 in the last 2 weeks.   . Tell the health care staff about your symptoms: fever, cough and shortness of breath. . After you have been seen by a medical provider, you will be either: o Tested for (COVID-19) and discharged home on quarantine except to seek medical care if symptoms worsen, and asked to  - Stay home and avoid contact with others until you get your results (4-5 days)  - Avoid travel on public transportation if possible (such as bus, train, or airplane) or o Sent to the Emergency Department by EMS for evaluation, COVID-19 testing, and possible admission depending on your condition and test results.  What to do if you are LOW RISK for COVID-19?  Reduce your risk of any infection by using the same precautions used for avoiding the common cold or flu:  Marland Kitchen Wash your hands often with soap and warm water for at least 20 seconds.  If soap and water are not readily available, use an alcohol-based hand sanitizer with at least 60% alcohol.  . If coughing or  sneezing, cover your mouth and nose by coughing or sneezing into the elbow areas of your shirt or coat, into a tissue or into your sleeve (not your hands). . Avoid shaking hands with others and consider head nods or verbal greetings only. . Avoid touching your eyes, nose, or mouth with unwashed hands.  . Avoid close contact with people who are sick. . Avoid places or events with large numbers of people in one location, like concerts or  sporting events. . Carefully consider travel plans you have or are making. . If you are planning any travel outside or inside the [place], visit the CDC's Travelers' Health webpage for the latest health notices. . If you have some symptoms but not all symptoms, continue to monitor at home and seek medical attention if your symptoms worsen. . If you are having a medical emergency, call 911.   [place] / e-Visit: [email or web address]         [facility]: [facility]: [phone]                   [facility]: [phone]

## 2019-03-04 ENCOUNTER — Telehealth: Payer: Self-pay | Admitting: Hematology and Oncology

## 2019-03-04 ENCOUNTER — Other Ambulatory Visit (HOSPITAL_COMMUNITY): Payer: BC Managed Care – PPO

## 2019-03-04 NOTE — Telephone Encounter (Signed)
Scheduled appt per 10/29 los - pt to get an updated schedule next treatment - scheduled 3 cycles and to the end of treatment plan -

## 2019-03-21 ENCOUNTER — Other Ambulatory Visit (HOSPITAL_COMMUNITY)
Admission: RE | Admit: 2019-03-21 | Discharge: 2019-03-21 | Disposition: A | Discharge status: Home / Self Care | Payer: BC Managed Care – PPO | Source: Ambulatory Visit | Attending: Hematology and Oncology | Admitting: Hematology and Oncology

## 2019-03-21 ENCOUNTER — Other Ambulatory Visit: Payer: BC Managed Care – PPO

## 2019-03-21 ENCOUNTER — Encounter: Payer: Self-pay | Admitting: Hematology and Oncology

## 2019-03-21 ENCOUNTER — Other Ambulatory Visit: Payer: Self-pay | Admitting: *Deleted

## 2019-03-21 ENCOUNTER — Encounter (HOSPITAL_BASED_OUTPATIENT_CLINIC_OR_DEPARTMENT_OTHER): Payer: Self-pay | Admitting: General Surgery

## 2019-03-21 DIAGNOSIS — Z20822 Contact with and (suspected) exposure to covid-19: Secondary | ICD-10-CM

## 2019-03-21 DIAGNOSIS — Z01812 Encounter for preprocedural laboratory examination: Secondary | ICD-10-CM

## 2019-03-21 DIAGNOSIS — Z20828 Contact with and (suspected) exposure to other viral communicable diseases: Secondary | ICD-10-CM | POA: Insufficient documentation

## 2019-03-21 DIAGNOSIS — Z171 Estrogen receptor negative status [ER-]: Secondary | ICD-10-CM

## 2019-03-21 DIAGNOSIS — C50411 Malignant neoplasm of upper-outer quadrant of right female breast: Secondary | ICD-10-CM

## 2019-03-21 LAB — SARS CORONAVIRUS 2 (TAT 6-24 HRS): SARS Coronavirus 2: NEGATIVE

## 2019-03-24 ENCOUNTER — Inpatient Hospital Stay: Payer: BC Managed Care – PPO | Attending: Hematology and Oncology

## 2019-03-24 ENCOUNTER — Other Ambulatory Visit: Payer: Self-pay

## 2019-03-24 VITALS — BP 145/83 | HR 80 | Temp 98.5°F | Resp 17 | Ht 63.0 in | Wt 178.2 lb

## 2019-03-24 DIAGNOSIS — C50411 Malignant neoplasm of upper-outer quadrant of right female breast: Secondary | ICD-10-CM | POA: Insufficient documentation

## 2019-03-24 DIAGNOSIS — Z23 Encounter for immunization: Secondary | ICD-10-CM | POA: Insufficient documentation

## 2019-03-24 DIAGNOSIS — Z5112 Encounter for antineoplastic immunotherapy: Secondary | ICD-10-CM | POA: Insufficient documentation

## 2019-03-24 DIAGNOSIS — Z171 Estrogen receptor negative status [ER-]: Secondary | ICD-10-CM

## 2019-03-24 MED ORDER — SODIUM CHLORIDE 0.9 % IV SOLN
Freq: Once | INTRAVENOUS | Status: AC
  Administered 2019-03-24: 10:00:00 via INTRAVENOUS
  Filled 2019-03-24: qty 250

## 2019-03-24 MED ORDER — TRASTUZUMAB-DKST CHEMO 150 MG IV SOLR
450.0000 mg | Freq: Once | INTRAVENOUS | Status: AC
  Administered 2019-03-24: 450 mg via INTRAVENOUS
  Filled 2019-03-24: qty 21.43

## 2019-03-24 MED ORDER — INFLUENZA VAC SPLIT QUAD 0.5 ML IM SUSY
0.5000 mL | PREFILLED_SYRINGE | Freq: Once | INTRAMUSCULAR | Status: AC
  Administered 2019-03-24: 0.5 mL via INTRAMUSCULAR

## 2019-03-24 MED ORDER — DIPHENHYDRAMINE HCL 25 MG PO CAPS
ORAL_CAPSULE | ORAL | Status: AC
  Filled 2019-03-24: qty 2

## 2019-03-24 MED ORDER — DIPHENHYDRAMINE HCL 25 MG PO CAPS
50.0000 mg | ORAL_CAPSULE | Freq: Once | ORAL | Status: AC
  Administered 2019-03-24: 50 mg via ORAL

## 2019-03-24 MED ORDER — ACETAMINOPHEN 325 MG PO TABS
ORAL_TABLET | ORAL | Status: AC
  Filled 2019-03-24: qty 2

## 2019-03-24 MED ORDER — ACETAMINOPHEN 325 MG PO TABS
650.0000 mg | ORAL_TABLET | Freq: Once | ORAL | Status: AC
  Administered 2019-03-24: 650 mg via ORAL

## 2019-03-24 MED ORDER — HEPARIN SOD (PORK) LOCK FLUSH 100 UNIT/ML IV SOLN
500.0000 [IU] | Freq: Once | INTRAVENOUS | Status: AC | PRN
  Administered 2019-03-24: 500 [IU]
  Filled 2019-03-24: qty 5

## 2019-03-24 MED ORDER — INFLUENZA VAC SPLIT QUAD 0.5 ML IM SUSY
PREFILLED_SYRINGE | INTRAMUSCULAR | Status: AC
  Filled 2019-03-24: qty 0.5

## 2019-03-24 MED ORDER — SODIUM CHLORIDE 0.9% FLUSH
10.0000 mL | INTRAVENOUS | Status: DC | PRN
  Administered 2019-03-24: 10 mL
  Filled 2019-03-24: qty 10

## 2019-03-24 NOTE — Progress Notes (Signed)
Per Dr. Lindi Adie: OK to treat with echo from 10/22/18. Desk RN working on scheduling another echo before next treatment

## 2019-03-24 NOTE — Patient Instructions (Addendum)
Trastuzumab injection for infusion What is this medicine? TRASTUZUMAB (tras TOO zoo mab) is a monoclonal antibody. It is used to treat breast cancer and stomach cancer. This medicine may be used for other purposes; ask your health care provider or pharmacist if you have questions. COMMON BRAND NAME(S): Herceptin, Herzuma, KANJINTI, Ogivri, Ontruzant, Trazimera What should I tell my health care provider before I take this medicine? They need to know if you have any of these conditions:  heart disease  heart failure  lung or breathing disease, like asthma  an unusual or allergic reaction to trastuzumab, benzyl alcohol, or other medications, foods, dyes, or preservatives  pregnant or trying to get pregnant  breast-feeding How should I use this medicine? This drug is given as an infusion into a vein. It is administered in a hospital or clinic by a specially trained health care professional. Talk to your pediatrician regarding the use of this medicine in children. This medicine is not approved for use in children. Overdosage: If you think you have taken too much of this medicine contact a poison control center or emergency room at once. NOTE: This medicine is only for you. Do not share this medicine with others. What if I miss a dose? It is important not to miss a dose. Call your doctor or health care professional if you are unable to keep an appointment. What may interact with this medicine? This medicine may interact with the following medications:  certain types of chemotherapy, such as daunorubicin, doxorubicin, epirubicin, and idarubicin This list may not describe all possible interactions. Give your health care provider a list of all the medicines, herbs, non-prescription drugs, or dietary supplements you use. Also tell them if you smoke, drink alcohol, or use illegal drugs. Some items may interact with your medicine. What should I watch for while using this medicine? Visit your  doctor for checks on your progress. Report any side effects. Continue your course of treatment even though you feel ill unless your doctor tells you to stop. Call your doctor or health care professional for advice if you get a fever, chills or sore throat, or other symptoms of a cold or flu. Do not treat yourself. Try to avoid being around people who are sick. You may experience fever, chills and shaking during your first infusion. These effects are usually mild and can be treated with other medicines. Report any side effects during the infusion to your health care professional. Fever and chills usually do not happen with later infusions. Do not become pregnant while taking this medicine or for 7 months after stopping it. Women should inform their doctor if they wish to become pregnant or think they might be pregnant. Women of child-bearing potential will need to have a negative pregnancy test before starting this medicine. There is a potential for serious side effects to an unborn child. Talk to your health care professional or pharmacist for more information. Do not breast-feed an infant while taking this medicine or for 7 months after stopping it. Women must use effective birth control with this medicine. What side effects may I notice from receiving this medicine? Side effects that you should report to your doctor or health care professional as soon as possible:  allergic reactions like skin rash, itching or hives, swelling of the face, lips, or tongue  chest pain or palpitations  cough  dizziness  feeling faint or lightheaded, falls  fever  general ill feeling or flu-like symptoms  signs of worsening heart failure like   breathing problems; swelling in your legs and feet  unusually weak or tired Side effects that usually do not require medical attention (report to your doctor or health care professional if they continue or are bothersome):  bone pain  changes in  taste  diarrhea  joint pain  nausea/vomiting  weight loss This list may not describe all possible side effects. Call your doctor for medical advice about side effects. You may report side effects to FDA at 1-800-FDA-1088. Where should I keep my medicine? This drug is given in a hospital or clinic and will not be stored at home. NOTE: This sheet is a summary. It may not cover all possible information. If you have questions about this medicine, talk to your doctor, pharmacist, or health care provider.  2020 Elsevier/Gold Standard (2016-04-15 14:37:52)  Influenza (Flu) Vaccine (Inactivated or Recombinant): What You Need to Know 1. Why get vaccinated? Influenza vaccine can prevent influenza (flu). Flu is a contagious disease that spreads around the [place] every year, usually between October and May. Anyone can get the flu, but it is more dangerous for some people. Infants and young children, people 66 years of age and older, pregnant women, and people with certain health conditions or a weakened immune system are at greatest risk of flu complications. Pneumonia, bronchitis, sinus infections and ear infections are examples of flu-related complications. If you have a medical condition, such as heart disease, cancer or diabetes, flu can make it worse. Flu can cause fever and chills, sore throat, muscle aches, fatigue, cough, headache, and runny or stuffy nose. Some people may have vomiting and diarrhea, though this is more common in children than adults. Each year thousands of people in the [place] die from flu, and many more are hospitalized. Flu vaccine prevents millions of illnesses and flu-related visits to the doctor each year. 2. Influenza vaccine CDC recommends everyone 106 months of age and older get vaccinated every flu season. Children 6 months through 1 years of age may need 2 doses during a single flu season. Everyone else needs only 1 dose each flu season. It takes about 2  weeks for protection to develop after vaccination. There are many flu viruses, and they are always changing. Each year a new flu vaccine is made to protect against three or four viruses that are likely to cause disease in the upcoming flu season. Even when the vaccine doesn't exactly match these viruses, it may still provide some protection. Influenza vaccine does not cause flu. Influenza vaccine may be given at the same time as other vaccines. 3. Talk with your health care provider Tell your vaccine provider if the person getting the vaccine:  Has had an allergic reaction after a previous dose of influenza vaccine, or has any severe, life-threatening allergies.  Has ever had Guillain-Barr Syndrome (also called GBS). In some cases, your health care provider may decide to postpone influenza vaccination to a future visit. People with minor illnesses, such as a cold, may be vaccinated. People who are moderately or severely ill should usually wait until they recover before getting influenza vaccine. Your health care provider can give you more information. 4. Risks of a vaccine reaction  Soreness, redness, and swelling where shot is given, fever, muscle aches, and headache can happen after influenza vaccine.  There may be a very small increased risk of Guillain-Barr Syndrome (GBS) after inactivated influenza vaccine (the flu shot). Young children who get the flu shot along with pneumococcal vaccine (PCV13), and/or DTaP vaccine  at the same time might be slightly more likely to have a seizure caused by fever. Tell your health care provider if a child who is getting flu vaccine has ever had a seizure. People sometimes faint after medical procedures, including vaccination. Tell your provider if you feel dizzy or have vision changes or ringing in the ears. As with any medicine, there is a very remote chance of a vaccine causing a severe allergic reaction, other serious injury, or death. 5. What if there  is a serious problem? An allergic reaction could occur after the vaccinated person leaves the clinic. If you see signs of a severe allergic reaction (hives, swelling of the face and throat, difficulty breathing, a fast heartbeat, dizziness, or weakness), call 9-1-1 and get the person to the nearest hospital. For other signs that concern you, call your health care provider. Adverse reactions should be reported to the Vaccine Adverse Event Reporting System (VAERS). Your health care provider will usually file this report, or you can do it yourself. Visit the VAERS website at www.vaers.SamedayNews.es or call (308)784-6549.VAERS is only for reporting reactions, and VAERS staff do not give medical advice. 6. The National Vaccine Injury Compensation Program The [employer] Vaccine Injury Compensation Program (VICP) is a federal program that was created to compensate people who may have been injured by certain vaccines. Visit the VICP website at GoldCloset.com.ee or call 904-270-0231 to learn about the program and about filing a claim. There is a time limit to file a claim for compensation. 7. How can I learn more?  Ask your healthcare provider.  Call your local or state health department.  Contact the Centers for Disease Control and Prevention (CDC): ? Call 706-810-9670 (1-800-CDC-INFO) or ? Visit CDC's https://gibson.com/ Vaccine Information Statement (Interim) Inactivated Influenza Vaccine (12/17/2017) This information is not intended to replace advice given to you by your health care provider. Make sure you discuss any questions you have with your health care provider. Document Released: 02/13/2006 Document Revised: 08/10/2018 Document Reviewed: 12/21/2017 Elsevier Patient Education  2020 [place] (COVID-19) Are you at risk?  Are you at risk for the Coronavirus (COVID-19)?  To be considered HIGH RISK for Coronavirus (COVID-19), you have to meet the following  criteria:  . Traveled to [place], [place], [place], [place] or [place]; or in the [place] to [place], [place], [place], or [place]; and have fever, cough, and shortness of breath within the last 2 weeks of travel OR . Been in close contact with a person diagnosed with COVID-19 within the last 2 weeks and have fever, cough, and shortness of breath . IF YOU DO NOT MEET THESE CRITERIA, YOU ARE CONSIDERED LOW RISK FOR COVID-19.  What to do if you are HIGH RISK for COVID-19?  Marland Kitchen If you are having a medical emergency, call 911. . Seek medical care right away. Before you go to a doctor's office, urgent care or emergency department, call ahead and tell them about your recent travel, contact with someone diagnosed with COVID-19, and your symptoms. You should receive instructions from your physician's office regarding next steps of care.  . When you arrive at healthcare provider, tell the healthcare staff immediately you have returned from visiting [place], [place], [place], [place] or [place]; or traveled in the [place] to [place], [place], [place], or [place]; in the last two weeks or you have been in close contact with a person diagnosed with COVID-19 in the last 2 weeks.   Marland Kitchen  Tell the health care staff about your symptoms: fever, cough and shortness of breath. . After you have been seen by a medical provider, you will be either: o Tested for (COVID-19) and discharged home on quarantine except to seek medical care if symptoms worsen, and asked to  - Stay home and avoid contact with others until you get your results (4-5 days)  - Avoid travel on public transportation if possible (such as bus, train, or airplane) or o Sent to the Emergency Department by EMS for evaluation, COVID-19 testing, and possible admission depending on your condition and test results.  What to do if you are LOW RISK for COVID-19?  Reduce your risk of any infection by using the same precautions used for  avoiding the common cold or flu:  Marland Kitchen Wash your hands often with soap and warm water for at least 20 seconds.  If soap and water are not readily available, use an alcohol-based hand sanitizer with at least 60% alcohol.  . If coughing or sneezing, cover your mouth and nose by coughing or sneezing into the elbow areas of your shirt or coat, into a tissue or into your sleeve (not your hands). . Avoid shaking hands with others and consider head nods or verbal greetings only. . Avoid touching your eyes, nose, or mouth with unwashed hands.  . Avoid close contact with people who are sick. . Avoid places or events with large numbers of people in one location, like concerts or sporting events. . Carefully consider travel plans you have or are making. . If you are planning any travel outside or inside the [place], visit the CDC's Travelers' Health webpage for the latest health notices. . If you have some symptoms but not all symptoms, continue to monitor at home and seek medical attention if your symptoms worsen. . If you are having a medical emergency, call 911.   [place] / e-Visit: [email or web address]         [facility]: [facility]: [id number]                   [facility]: [phone]

## 2019-03-30 ENCOUNTER — Ambulatory Visit (HOSPITAL_COMMUNITY)
Admission: RE | Admit: 2019-03-30 | Discharge: 2019-03-30 | Disposition: A | Discharge status: Home / Self Care | Payer: BC Managed Care – PPO | Source: Ambulatory Visit | Attending: Hematology and Oncology | Admitting: Hematology and Oncology

## 2019-03-30 ENCOUNTER — Other Ambulatory Visit: Payer: Self-pay

## 2019-03-30 DIAGNOSIS — Z171 Estrogen receptor negative status [ER-]: Secondary | ICD-10-CM | POA: Diagnosis not present

## 2019-03-30 DIAGNOSIS — I082 Rheumatic disorders of both aortic and tricuspid valves: Secondary | ICD-10-CM | POA: Diagnosis not present

## 2019-03-30 DIAGNOSIS — C50411 Malignant neoplasm of upper-outer quadrant of right female breast: Secondary | ICD-10-CM | POA: Diagnosis not present

## 2019-03-30 NOTE — Progress Notes (Signed)
  Echocardiogram 2D Echocardiogram has been performed.  [name] 03/30/2019, 2:16 PM

## 2019-04-06 DIAGNOSIS — C50911 Malignant neoplasm of unspecified site of right female breast: Secondary | ICD-10-CM | POA: Diagnosis not present

## 2019-04-06 DIAGNOSIS — Z9012 Acquired absence of left breast and nipple: Secondary | ICD-10-CM | POA: Diagnosis not present

## 2019-04-07 ENCOUNTER — Other Ambulatory Visit: Payer: Self-pay

## 2019-04-07 ENCOUNTER — Ambulatory Visit: Payer: BC Managed Care – PPO | Attending: General Surgery | Admitting: Physical Therapy

## 2019-04-07 ENCOUNTER — Encounter: Payer: Self-pay | Admitting: Physical Therapy

## 2019-04-07 DIAGNOSIS — R252 Cramp and spasm: Secondary | ICD-10-CM | POA: Diagnosis not present

## 2019-04-07 DIAGNOSIS — Z483 Aftercare following surgery for neoplasm: Secondary | ICD-10-CM | POA: Diagnosis not present

## 2019-04-07 DIAGNOSIS — R600 Localized edema: Secondary | ICD-10-CM | POA: Diagnosis not present

## 2019-04-07 DIAGNOSIS — R293 Abnormal posture: Secondary | ICD-10-CM | POA: Insufficient documentation

## 2019-04-07 NOTE — Therapy (Signed)
[place], [place], [address] Phone: [phone]   Fax:  [phone]  Physical Therapy Evaluation  Patient Details  Name: [name] MRN: [id number] Date of Birth: [birth year] Referring Provider (PT): [name]   Encounter Date: 04/07/2019  PT End of Session - 04/07/19 1443    Visit Number  1    Number of Visits  9    Date for PT Re-Evaluation  05/05/19    PT Start Time  [id number]    PT Stop Time  1442    PT Time Calculation (min)  39 min    Activity Tolerance  Patient tolerated treatment well    Behavior During Therapy  [facility] for tasks assessed/performed       Past Medical History:  Diagnosis Date  . Anxiety    over cancer diagnosis  . Arthritis   . Breast cancer ([place])    right breast cancer chemo finished per pt  . Family history of breast cancer   . Lynch syndrome   . Scleroderma [facility])     Past Surgical History:  Procedure Laterality Date  . BREAST RECONSTRUCTION WITH PLACEMENT OF TISSUE EXPANDER AND FLEX HD (ACELLULAR HYDRATED DERMIS) Bilateral 02/22/2019   Procedure: BILATERAL BREAST RECONSTRUCTION WITH PLACEMENT OF TISSUE EXPANDER AND FLEX HD (ACELLULAR HYDRATED DERMIS);  Surgeon: Irene Limbo, MD;  Location: [place];  Service: Plastics;  Laterality: Bilateral;  . CERVICAL FUSION  2019  . CESAREAN SECTION  2005  . CESAREAN SECTION  02/27/2011   Procedure: CESAREAN SECTION;  Surgeon: Shon Millet II;  Location: [place] [facility];  Service: Gynecology;  Laterality: N/A;  repeat  . NIPPLE SPARING MASTECTOMY WITH SENTINEL LYMPH NODE BIOPSY Bilateral 02/22/2019   Procedure: RIGHT NIPPLE SPARING MASTECTOMY AND LEFT RISK REDUCING NIPPLE SPARING MASTECTOMY WITH RIGHT AXILLARY SENTINEL LYMPH NODE BIOPSY;  Surgeon: Rolm Bookbinder, MD;  Location: [place];  Service: General;  Laterality: Bilateral;  BILATERAL PECTORAL BLOCK  . PORTACATH PLACEMENT Right 10/20/2018    Procedure: INSERTION PORT-A-CATH WITH ULTRASOUND;  Surgeon: Rolm Bookbinder, MD;  Location: [place];  Service: General;  Laterality: Right;    There were no vitals filed for this visit.   Subjective Assessment - 04/07/19 1410    Subjective  I feel like I am doing really well but I have trouble reaching behind my back.    Pertinent History  R nipple sparing mastectomy, L risk reducing nipple sparing mastectomy with R SLNB 02/22/19 with immediate reconstruction, completed chemo from 10/29/18-01/26/19, cervical fusion 2019    Patient Stated Goals  to be educated about lymphedema and decrease risk    Currently in Pain?  No/denies    Pain Score  0-No pain         [facility] PT Assessment - 04/07/19 0001      Assessment   Medical Diagnosis  right breast cancer    Referring Provider (PT)  [name]    Onset Date/Surgical Date  02/22/19    Hand Dominance  Right    Prior Therapy  none      Precautions   Precautions  Other (comment)    Precaution Comments  at risk for lymphedema      Restrictions   Weight Bearing Restrictions  No      Balance Screen   Has the patient fallen in the past 6 months  No    Has the patient had a decrease in activity level because of a fear  of falling?   No    Is the patient reluctant to leave their home because of a fear of falling?   No      Home Environment   Living Environment  Private residence    Living Arrangements  Spouse/significant other;Children    Available Help at Discharge  Family    Type of [place]      Prior Function   Level of [place]  Other (comment)   out from surgery until Jan 2021   Vocation Requirements  treatment coordinator- desk work    Leisure  jogger/walker prior to surgery, currently walking 3x/wk for about an hour      Cognition   Overall Cognitive Status  Within Functional Limits for tasks assessed      Observation/Other Assessments   Observations  tightness palpable at  bilateral inferior axilla and trunk      Posture/Postural Control   Posture/Postural Control  Postural limitations    Postural Limitations  Rounded Shoulders      ROM / Strength   AROM / PROM / Strength  AROM      AROM   AROM Assessment Site  Shoulder    Right/Left Shoulder  Right;Left    Right Shoulder Flexion  165 Degrees    Right Shoulder ABduction  176 Degrees    Right Shoulder Internal Rotation  69 Degrees    Right Shoulder External Rotation  76 Degrees    Left Shoulder Flexion  164 Degrees    Left Shoulder ABduction  168 Degrees    Left Shoulder Internal Rotation  74 Degrees    Left Shoulder External Rotation  83 Degrees        LYMPHEDEMA/ONCOLOGY QUESTIONNAIRE - 04/07/19 1426      Type   Cancer Type  right breast cancer      Surgeries   Mastectomy Date  02/22/19    Saline Implant Reconstruction Date  05/31/19    Sentinel Lymph Node Biopsy Date  02/22/19    Number Lymph Nodes Removed  3      Date Lymphedema/Swelling Started   Date  02/22/19      Treatment   Active Chemotherapy Treatment  No    Past Chemotherapy Treatment  Yes    Active Radiation Treatment  No    Past Radiation Treatment  No    Current Hormone Treatment  No    Past Hormone Therapy  No      What other symptoms do you have   Are you Having Heaviness or Tightness  Yes    Are you having Pain  Yes    Are you having pitting edema  No    Is it Hard or Difficult finding clothes that fit  No    Do you have infections  No    Is there Decreased scar mobility  No      Lymphedema Assessments   Lymphedema Assessments  Upper extremities      Right Upper Extremity Lymphedema   15 cm Proximal to Olecranon Process  35.5 cm    Olecranon Process  28.5 cm    15 cm Proximal to Ulnar Styloid Process  28.5 cm    Just Proximal to Ulnar Styloid Process  17.8 cm    Across Hand at [employer]  21 cm    At [place] of 2nd Digit  6.6 cm      Left Upper Extremity Lymphedema   15 cm Proximal  to Olecranon  Process  34.6 cm    Olecranon Process  29 cm    15 cm Proximal to Ulnar Styloid Process  27.9 cm    Just Proximal to Ulnar Styloid Process  17.5 cm    Across Hand at [employer]  20.2 cm    At [place] of 2nd Digit  6.5 cm             Objective measurements completed on examination: See above findings.              PT Education - 04/07/19 1451    Education Details  anatomy and physiology of lymphatc system, signs/symptoms of lymphedema, lymphedema risk reduction practices    Person(s) Educated  Patient    Methods  Explanation;Handout    Comprehension  Verbalized understanding          PT Long Term Goals - 04/07/19 1449      PT LONG TERM GOAL #1   Title  Pt will be independent in a home exercise program for continued strengthening and stretching.    Time  4    Period  Weeks    Status  New    Target Date  05/05/19      PT LONG TERM GOAL #2   Title  Pt will be able to reach behind back with no muscle spasms or discomfort.    Time  4    Period  Weeks    Status  New    Target Date  05/05/19      PT LONG TERM GOAL #3   Title  Pt will receive prophylactic compression sleeve and gauntlet to try and minimize risk of developing lymphedema.    Time  4    Period  Weeks    Status  New    Target Date  05/05/19      PT LONG TERM GOAL #4   Title  Pt will be independent in self MLD for management of post surgical edema.    Time  4    Period  Weeks    Status  New    Target Date  05/05/19             Plan - 04/07/19 1443    Clinical Impression Statement  Pt presents to PT with some axillary tightness and minimal post surgical swelling following a right nipple sparing mastectomy and left risk reducing mastectomy for treatment of right breast cancer. Pt also had a R SLNB (3 nodes). She has some axillary tightness and sometimes has muscle cramps when reaching behind her for hygiene. She also has some minimal post surgical swelling in bilateral trunk. She would  benefit from skilled PT services to decrease axillary tightness to decrease muscle spasms, decrease post surgical swelling and instruct pt in a home exercise program that she can progress at home.    Examination-Activity Limitations  Hygiene/Grooming;Toileting    Stability/Clinical Decision Making  Evolving/Moderate complexity    Clinical Decision Making  Moderate    Rehab Potential  Good    PT Frequency  2x / week    PT Duration  4 weeks    PT Treatment/Interventions  ADLs/Self Care Home Management;Therapeutic activities;Therapeutic exercise;Patient/family education;Manual techniques;Manual lymph drainage;Compression bandaging;Scar mobilization;Passive range of motion;Taping    PT Next Visit Plan  teach self MLD and do to bilateral trunk, STM to serratus and lats bilaterally, educate more about lymphedema and when to come to therapy, eventually give Strength ABC    Consulted  and Agree with Plan of Care  Patient       Patient will benefit from skilled therapeutic intervention in order to improve the following deficits and impairments:  Increased muscle spasms, Decreased range of motion, Increased edema, Postural dysfunction, Pain, Increased fascial restricitons  Visit Diagnosis: Cramp and spasm  Aftercare following surgery for neoplasm  Localized edema  Abnormal posture     Problem List Patient Active Problem List   Diagnosis Date Noted  . Breast cancer ([place]) 02/22/2019  . Chemotherapy-induced peripheral neuropathy ([place]) 01/06/2019  . Genetic testing 11/16/2018  . Port-A-Cath in place 11/03/2018  . Family history of breast cancer   . Malignant neoplasm of upper-outer quadrant of right breast in female, estrogen receptor negative ([place]) 10/19/2018    Allyson Sabal [facility] 04/07/2019, 2:52 PM  [place], [place], [address] Phone: [phone]   Fax:  [phone]  Name: ELYCIA ROTHSCHILD MRN:  [id number] Date of Birth: 09-Oct-1980  Manus Gunning, PT 04/07/19 2:52 PM

## 2019-04-08 ENCOUNTER — Ambulatory Visit: Payer: BC Managed Care – PPO | Admitting: Physical Therapy

## 2019-04-08 ENCOUNTER — Encounter: Payer: Self-pay | Admitting: Physical Therapy

## 2019-04-08 DIAGNOSIS — Z483 Aftercare following surgery for neoplasm: Secondary | ICD-10-CM | POA: Diagnosis not present

## 2019-04-08 DIAGNOSIS — R600 Localized edema: Secondary | ICD-10-CM

## 2019-04-08 DIAGNOSIS — R252 Cramp and spasm: Secondary | ICD-10-CM | POA: Diagnosis not present

## 2019-04-08 DIAGNOSIS — R293 Abnormal posture: Secondary | ICD-10-CM | POA: Diagnosis not present

## 2019-04-08 NOTE — Therapy (Addendum)
[place], [place], [address] Phone: [phone]   Fax:  [phone]  Physical Therapy Treatment  Patient Details  Name: [name] MRN: [id number] Date of Birth: [birth year] Referring Provider (PT): [name]   Encounter Date: 04/08/2019  PT End of Session - 04/08/19 1055    Visit Number  2    Number of Visits  9    Date for PT Re-Evaluation  05/05/19    PT Start Time  1004    PT Stop Time  [id number]    PT Time Calculation (min)  48 min    Activity Tolerance  Patient tolerated treatment well    Behavior During Therapy  [facility] for tasks assessed/performed       Past Medical History:  Diagnosis Date  . Anxiety    over cancer diagnosis  . Arthritis   . Breast cancer ([place])    right breast cancer chemo finished per pt  . Family history of breast cancer   . Lynch syndrome   . Scleroderma [facility])     Past Surgical History:  Procedure Laterality Date  . BREAST RECONSTRUCTION WITH PLACEMENT OF TISSUE EXPANDER AND FLEX HD (ACELLULAR HYDRATED DERMIS) Bilateral 02/22/2019   Procedure: BILATERAL BREAST RECONSTRUCTION WITH PLACEMENT OF TISSUE EXPANDER AND FLEX HD (ACELLULAR HYDRATED DERMIS);  Surgeon: Irene Limbo, MD;  Location: [place];  Service: Plastics;  Laterality: Bilateral;  . CERVICAL FUSION  2019  . CESAREAN SECTION  2005  . CESAREAN SECTION  02/27/2011   Procedure: CESAREAN SECTION;  Surgeon: Shon Millet II;  Location: [place] [facility];  Service: Gynecology;  Laterality: N/A;  repeat  . NIPPLE SPARING MASTECTOMY WITH SENTINEL LYMPH NODE BIOPSY Bilateral 02/22/2019   Procedure: RIGHT NIPPLE SPARING MASTECTOMY AND LEFT RISK REDUCING NIPPLE SPARING MASTECTOMY WITH RIGHT AXILLARY SENTINEL LYMPH NODE BIOPSY;  Surgeon: Rolm Bookbinder, MD;  Location: [place];  Service: General;  Laterality: Bilateral;  BILATERAL PECTORAL BLOCK  . PORTACATH PLACEMENT Right 10/20/2018    Procedure: INSERTION PORT-A-CATH WITH ULTRASOUND;  Surgeon: Rolm Bookbinder, MD;  Location: [place];  Service: General;  Laterality: Right;    There were no vitals filed for this visit.  Subjective Assessment - 04/08/19 1005    Subjective  I had a spasm last night when I went to scratch my back.    Pertinent History  R nipple sparing mastectomy, L risk reducing nipple sparing mastectomy with R SLNB 02/22/19 with immediate reconstruction, completed chemo from 10/29/18-01/26/19, cervical fusion 2019    Patient Stated Goals  to be educated about lymphedema and decrease risk    Currently in Pain?  No/denies    Pain Score  0-No pain                       [facility] Adult PT Treatment/Exercise - 04/08/19 0001      Manual Therapy   Manual Therapy  Soft tissue mobilization;Edema management;Passive ROM    Edema Management  cut 1/2 inch grey foam and placed in thick stockinette for pt to wear on bilateral trunk to help reduce post surgical edema    Soft tissue mobilization  in left sidelying to right lateral trunk in area of tightness to decrease discomfort   Passive ROM  to bilateral shoulders in direction of flexion and abduction with prolonged holds                  PT Long Term  Goals - 04/07/19 1449      PT LONG TERM GOAL #1   Title  Pt will be independent in a home exercise program for continued strengthening and stretching.    Time  4    Period  Weeks    Status  New    Target Date  05/05/19      PT LONG TERM GOAL #2   Title  Pt will be able to reach behind back with no muscle spasms or discomfort.    Time  4    Period  Weeks    Status  New    Target Date  05/05/19      PT LONG TERM GOAL #3   Title  Pt will receive prophylactic compression sleeve and gauntlet to try and minimize risk of developing lymphedema.    Time  4    Period  Weeks    Status  New    Target Date  05/05/19      PT LONG TERM GOAL #4   Title  Pt will be independent  in self MLD for management of post surgical edema.    Time  4    Period  Weeks    Status  New    Target Date  05/05/19            Plan - 04/08/19 1056    Clinical Impression Statement  Began soft tissue mobilization to area of tightness in right trunk where pt has spasms. This area felt more loose by end of session today. Briefly performed PROM with prolonged stretch at end range to bilateral shoulders. Cut foam pieces for pt to wear in bra to help improve comfort and decrease lateral trunk edema.    PT Frequency  2x / week    PT Duration  4 weeks    PT Treatment/Interventions  ADLs/Self Care Home Management;Therapeutic activities;Therapeutic exercise;Patient/family education;Manual techniques;Manual lymph drainage;Compression bandaging;Scar mobilization;Passive range of motion;Taping    PT Next Visit Plan  teach self MLD and do to bilateral trunk, STM to serratus and lats bilaterally, educate more about lymphedema and when to come to therapy, eventually give Strength ABC    Consulted and Agree with Plan of Care  Patient       Patient will benefit from skilled therapeutic intervention in order to improve the following deficits and impairments:  Increased muscle spasms, Decreased range of motion, Increased edema, Postural dysfunction, Pain, Increased fascial restricitons  Visit Diagnosis: Cramp and spasm  Aftercare following surgery for neoplasm  Localized edema     Problem List Patient Active Problem List   Diagnosis Date Noted  . Breast cancer ([place]) 02/22/2019  . Chemotherapy-induced peripheral neuropathy ([place]) 01/06/2019  . Genetic testing 11/16/2018  . Port-A-Cath in place 11/03/2018  . Family history of breast cancer   . Malignant neoplasm of upper-outer quadrant of right breast in female, estrogen receptor negative ([place]) 10/19/2018    Allyson Sabal [facility] 04/08/2019, 10:57 AM  [place], [place], [address] Phone: [phone]   Fax:  [phone]  Name: [name] MRN: [id number] Date of Birth: 23-Jun-1980  Manus Gunning, PT 04/08/19 10:57 AM

## 2019-04-13 ENCOUNTER — Encounter (HOSPITAL_BASED_OUTPATIENT_CLINIC_OR_DEPARTMENT_OTHER): Payer: Self-pay | Admitting: General Surgery

## 2019-04-13 NOTE — Progress Notes (Signed)
Patient Care Team: Redmon, Barth Kirks, PA-C as PCP - General (Nurse Practitioner)  DIAGNOSIS:    ICD-10-CM   1. Malignant neoplasm of upper-outer quadrant of right breast in female, estrogen receptor negative ([place])  C50.411    Z17.1     SUMMARY OF ONCOLOGIC HISTORY: Oncology History  Malignant neoplasm of upper-outer quadrant of right breast in female, estrogen receptor negative ([place])  10/11/2018 Initial Diagnosis   Patient palpated a right breast mass, mammogram revealed 1.6 cm mass, biopsy revealed grade 3 invasive ductal carcinoma ER 0%, PR 0%, Ki-67 20%, HER-2 3+ positive, T1CN0 stage IA   10/19/2018 Cancer Staging   Staging form: Breast, AJCC 8th Edition - Clinical stage from 10/19/2018: Stage IA (cT1c, cN0, cM0, G3, ER-, PR-, HER2+) - Signed by [name], MD on 10/19/2018   10/29/2018 - 01/26/2019 Chemotherapy   The patient had trastuzumab (HERCEPTIN) 300 mg in sodium chloride 0.9 % 250 mL chemo infusion, 294 mg, Intravenous,  Once, 2 of 2 cycles Administration: 300 mg (10/29/2018), 150 mg (11/03/2018), 150 mg (12/02/2018), 150 mg (11/12/2018), 150 mg (11/25/2018), 150 mg (12/09/2018) PACLitaxel (TAXOL) 150 mg in sodium chloride 0.9 % 250 mL chemo infusion (</= 7m/m2), 80 mg/m2 = 150 mg, Intravenous,  Once, 3 of 3 cycles Dose modification: 65 mg/m2 (original dose 80 mg/m2, Cycle 3, Reason: Dose not tolerated) Administration: 150 mg (10/29/2018), 150 mg (11/03/2018), 150 mg (12/02/2018), 150 mg (11/12/2018), 150 mg (11/25/2018), 150 mg (12/09/2018), 150 mg (12/16/2018), 150 mg (12/23/2018), 120 mg (12/30/2018), 120 mg (01/06/2019), 120 mg (01/13/2019), 120 mg (01/20/2019) trastuzumab-dkst (OGIVRI) 150 mg in sodium chloride 0.9 % 250 mL chemo infusion, 150 mg (100 % of original dose 150 mg), Intravenous,  Once, 2 of 2 cycles Dose modification: 150 mg (original dose 150 mg, Cycle 2, Reason: Other (see comments), Comment: Biosimilar Conversion), 450 mg (original dose 150 mg, Cycle 3, Reason: Other (see  comments), Comment: Biosimilar Conversion) Administration: 150 mg (12/16/2018), 150 mg (12/23/2018), 150 mg (12/30/2018), 150 mg (01/06/2019), 150 mg (01/13/2019), 450 mg (01/20/2019)  for chemotherapy treatment.    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  The Common Hereditary Gene Panel offered by [facility] includes sequencing and/or deletion duplication testing of the following 48 genes: APC, ATM, AXIN2, BARD1, BMPR1A, BRCA1, BRCA2, BRIP1, CDH1, CDK4, CDKN2A (p14ARF), CDKN2A (p16INK4a), CHEK2, CTNNA1, DICER1, EPCAM (Deletion/duplication testing only), GREM1 (promoter region deletion/duplication testing only), KIT, MEN1, MLH1, MSH2, MSH3, MSH6, MUTYH, NBN, NF1, NHTL1, PALB2, PDGFRA, PMS2, POLD1, POLE, PTEN, RAD50, RAD51C, RAD51D, RNF43, SDHB, SDHC, SDHD, SMAD4, SMARCA4. STK11, TP53, TSC1, TSC2, and VHL.  The following genes were evaluated for sequence changes only: SDHA and HOXB13 c.251G>A variant only.   Two VUS's also found - one in ATM c.133C>T and the other in POLE c.1280C>T.  These will not affect medical management. The report date is November 12, 2018.   02/10/2019 -  Chemotherapy   The patient had trastuzumab-dkst (OGIVRI) 462 mg in sodium chloride 0.9 % 250 mL chemo infusion, 6 mg/kg = 630 mg, Intravenous,  Once, 3 of 12 cycles Administration: 450 mg (03/03/2019), 450 mg (03/24/2019)  for chemotherapy treatment.    02/22/2019 Surgery   Bilateral mastectomies ([name]) Left breast: benign tissue and no evidence of malignancy Right breast: no residual carcinoma, 2 right axillary lymph nodes negative      CHIEF COMPLIANT: Follow-up of Herceptin maintenance   INTERVAL HISTORY: Teresa CAUSEYis a 38y.o.  with above-mentioned history of right breast cancer who competed neoadjuvant chemotherapy, underwent bilateral mastectomies, and is currently on Herceptin maintenance. Echo on 03/30/19 showed an ejection  fraction of 03/30/19. She is a participant in the UpBeat clinical trial.She presents to the clinic todayfor treatment.   REVIEW OF SYSTEMS:   Constitutional: Denies fevers, chills or abnormal weight loss Eyes: Denies blurriness of vision Ears, nose, mouth, throat, and face: Denies mucositis or sore throat Respiratory: Denies cough, dyspnea or wheezes Cardiovascular: Denies palpitation, chest discomfort Gastrointestinal: Denies nausea, heartburn or change in bowel habits Skin: Denies abnormal skin rashes Lymphatics: Denies new lymphadenopathy or easy bruising Neurological: Denies numbness, tingling or new weaknesses Behavioral/Psych: Mood is stable, no new changes  Extremities: No lower extremity edema Breast: denies any pain or lumps or nodules in either breasts All other systems were reviewed with the patient and are negative.  I have reviewed the past medical history, past surgical history, social history and family history with the patient and they are unchanged from previous note.  ALLERGIES:  is allergic to methocarbamol.  MEDICATIONS:  Current Outpatient Medications  Medication Sig Dispense Refill  . buPROPion (WELLBUTRIN XL) 150 MG 24 hr tablet Take 1 tablet (150 mg total) by mouth daily. 30 tablet 3  . LORazepam (ATIVAN) 0.5 MG tablet Take 1 tablet (0.5 mg total) by mouth at bedtime as needed for anxiety. (Patient not taking: Reported on 04/07/2019) 30 tablet 1  . methocarbamol (ROBAXIN) 500 MG tablet Take 1 tablet (500 mg total) by mouth every 8 (eight) hours as needed for muscle spasms. (Patient not taking: Reported on 04/07/2019) 30 tablet 0  . oxyCODONE (OXY IR/ROXICODONE) 5 MG immediate release tablet Take 1 tablet (5 mg total) by mouth every 4 (four) hours as needed for moderate pain. (Patient not taking: Reported on 04/07/2019) 40 tablet 0  . sulfamethoxazole-trimethoprim (BACTRIM DS) 800-160 MG tablet Take 1 tablet by mouth 2 (two) times daily. (Patient not taking: Reported  on 04/07/2019) 12 tablet 0   No current facility-administered medications for this visit.    PHYSICAL EXAMINATION: ECOG PERFORMANCE STATUS: 1 - Symptomatic but completely ambulatory  Vitals:   04/14/19 1024  BP: 126/68  Pulse: 69  Resp: 20  Temp: 98.2 F (36.8 C)  SpO2: 99%   Filed Weights   04/14/19 1024  Weight: 177 lb (80.3 kg)    GENERAL: alert, no distress and comfortable SKIN: skin color, texture, turgor are normal, no rashes or significant lesions EYES: normal, Conjunctiva are pink and non-injected, sclera clear OROPHARYNX: no exudate, no erythema and lips, buccal mucosa, and tongue normal  NECK: supple, thyroid normal size, non-tender, without nodularity LYMPH: no palpable lymphadenopathy in the cervical, axillary or inguinal LUNGS: clear to auscultation and percussion with normal breathing effort HEART: regular rate & rhythm and no murmurs and no lower extremity edema ABDOMEN: abdomen soft, non-tender and normal bowel sounds MUSCULOSKELETAL: no cyanosis of digits and no clubbing  NEURO: alert & oriented x 3 with fluent speech, no focal motor/sensory deficits EXTREMITIES: No lower extremity edema  LABORATORY DATA:  I have reviewed the data as listed CMP Latest Ref Rng & Units 03/03/2019 01/20/2019 01/13/2019  Glucose 70 - 99 mg/dL 79 99 105(H)  BUN 6 - 20 mg/dL '12 14 10  ' Creatinine 0.44 - 1.00 mg/dL 0.76 0.81 0.80  Sodium 135 - 145 mmol/L 140 141 144  Potassium 3.5 - 5.1 mmol/L 4.7 4.3 4.2  Chloride 98 - 111 mmol/L 107 107 108  CO2 22 -  32 mmol/L '25 26 27  ' Calcium 8.9 - 10.3 mg/dL 8.8(L) 9.4 9.0  Total Protein 6.5 - 8.1 g/dL 6.5 7.0 6.6  Total Bilirubin 0.3 - 1.2 mg/dL <0.2(L) 0.3 <0.2(L)  Alkaline Phos 38 - 126 U/L 72 68 68  AST 15 - 41 U/L 34 11(L) 11(L)  ALT 0 - 44 U/L 81(H) 13 10    Lab Results  Component Value Date   WBC 6.3 03/03/2019   HGB 10.4 (L) 03/03/2019   HCT 31.6 (L) 03/03/2019   MCV 96.3 03/03/2019   PLT 318 03/03/2019   NEUTROABS 4.3  03/03/2019    ASSESSMENT & PLAN:  Malignant neoplasm of upper-outer quadrant of right breast in female, estrogen receptor negative ([place]) 10/11/2018:Patient palpated a right breast mass, mammogram revealed 1.6 cm mass, biopsy revealed grade 3 invasive ductal carcinoma ER 0%, PR 0%, Ki-67 20%, HER-2 3+ positive, T1CN0 stage IA  Treatment plan: 1. Neoadjuvant Taxol and Herceptin weekly x12. Followed by Herceptin maintenance therapy every 3 weeks to complete 1 year.  10/29/2018-01/26/2019 2.Breast conserving surgery with sentinel lymph node biopsy 02/22/2019:Bilateral mastectomies [name]) Left breast: benign tissue and no evidence of malignancy Right breast: no residual carcinoma, 2 right axillary lymph nodes negative  3.Adjuvant radiation therapy Upbeat clinical trial: No adverse effects from participating in the trial Lynch syndrome positive on genetic testing (I discussed with her about aspirin 600 mg for prevention of colon cancer risk: Study published in Lancet) ---------------------------------------------------------------------------------------------------------------------------------------- Depression/anxiety/panic attacks:OnWellbutrin. Adjuvant XRT Treatment plan: Continue Herceptin maintenance.  Chemo-induced peripheral neuropathy:   It appears that the patient may get the port out with reconstruction surgery in January. I recommended that she receive Herceptin as a subcu injection subsequently. I reassured her that the efficacy is the same whether it is IV or subcu.  RTC in 3 weeks for Herceptin and every 6 weeks for MD follow up.  No orders of the defined types were placed in this encounter.  The patient has a good understanding of the overall plan. she agrees with it. she will call with any problems that may develop before the next visit here.  [name], MD 04/14/2019  Julious Oka Dorshimer, am acting as scribe for Dr. [name].  I have  reviewed the above documentation for accuracy and completeness, and I agree with the above.

## 2019-04-14 ENCOUNTER — Ambulatory Visit: Payer: BC Managed Care – PPO | Admitting: Physical Therapy

## 2019-04-14 ENCOUNTER — Inpatient Hospital Stay: Payer: BC Managed Care – PPO

## 2019-04-14 ENCOUNTER — Inpatient Hospital Stay (HOSPITAL_BASED_OUTPATIENT_CLINIC_OR_DEPARTMENT_OTHER): Payer: BC Managed Care – PPO | Admitting: Hematology and Oncology

## 2019-04-14 ENCOUNTER — Other Ambulatory Visit: Payer: Self-pay

## 2019-04-14 ENCOUNTER — Inpatient Hospital Stay: Payer: BC Managed Care – PPO | Attending: Hematology and Oncology

## 2019-04-14 ENCOUNTER — Encounter: Payer: Self-pay | Admitting: Physical Therapy

## 2019-04-14 DIAGNOSIS — Z79899 Other long term (current) drug therapy: Secondary | ICD-10-CM | POA: Insufficient documentation

## 2019-04-14 DIAGNOSIS — C50411 Malignant neoplasm of upper-outer quadrant of right female breast: Secondary | ICD-10-CM

## 2019-04-14 DIAGNOSIS — Z483 Aftercare following surgery for neoplasm: Secondary | ICD-10-CM | POA: Diagnosis not present

## 2019-04-14 DIAGNOSIS — Z171 Estrogen receptor negative status [ER-]: Secondary | ICD-10-CM | POA: Insufficient documentation

## 2019-04-14 DIAGNOSIS — Z5112 Encounter for antineoplastic immunotherapy: Secondary | ICD-10-CM | POA: Diagnosis not present

## 2019-04-14 DIAGNOSIS — R252 Cramp and spasm: Secondary | ICD-10-CM

## 2019-04-14 DIAGNOSIS — R293 Abnormal posture: Secondary | ICD-10-CM | POA: Diagnosis not present

## 2019-04-14 DIAGNOSIS — R600 Localized edema: Secondary | ICD-10-CM

## 2019-04-14 LAB — CBC WITH DIFFERENTIAL (CANCER CENTER ONLY)
Abs Immature Granulocytes: 0.02 10*3/uL (ref 0.00–0.07)
Basophils Absolute: 0 10*3/uL (ref 0.0–0.1)
Basophils Relative: 0 %
Eosinophils Absolute: 0.1 10*3/uL (ref 0.0–0.5)
Eosinophils Relative: 2 %
HCT: 37.4 % (ref 36.0–46.0)
Hemoglobin: 12.2 g/dL (ref 12.0–15.0)
Immature Granulocytes: 0 %
Lymphocytes Relative: 25 %
Lymphs Abs: 1.5 10*3/uL (ref 0.7–4.0)
MCH: 30.1 pg (ref 26.0–34.0)
MCHC: 32.6 g/dL (ref 30.0–36.0)
MCV: 92.3 fL (ref 80.0–100.0)
Monocytes Absolute: 0.3 10*3/uL (ref 0.1–1.0)
Monocytes Relative: 6 %
Neutro Abs: 4 10*3/uL (ref 1.7–7.7)
Neutrophils Relative %: 67 %
Platelet Count: 228 10*3/uL (ref 150–400)
RBC: 4.05 MIL/uL (ref 3.87–5.11)
RDW: 12.3 % (ref 11.5–15.5)
WBC Count: 6 10*3/uL (ref 4.0–10.5)
nRBC: 0 % (ref 0.0–0.2)

## 2019-04-14 LAB — CMP (CANCER CENTER ONLY)
ALT: 16 U/L (ref 0–44)
AST: 14 U/L — ABNORMAL LOW (ref 15–41)
Albumin: 4 g/dL (ref 3.5–5.0)
Alkaline Phosphatase: 58 U/L (ref 38–126)
Anion gap: 10 (ref 5–15)
BUN: 15 mg/dL (ref 6–20)
CO2: 24 mmol/L (ref 22–32)
Calcium: 9 mg/dL (ref 8.9–10.3)
Chloride: 107 mmol/L (ref 98–111)
Creatinine: 0.83 mg/dL (ref 0.44–1.00)
GFR, Est AFR Am: >60 mL/min (ref >60)
GFR, Estimated: >60 mL/min (ref >60)
Glucose, Bld: 90 mg/dL (ref 70–99)
Potassium: 4.3 mmol/L (ref 3.5–5.1)
Sodium: 141 mmol/L (ref 135–145)
Total Bilirubin: 0.3 mg/dL (ref 0.3–1.2)
Total Protein: 6.9 g/dL (ref 6.5–8.1)

## 2019-04-14 MED ORDER — SODIUM CHLORIDE 0.9 % IV SOLN
Freq: Once | INTRAVENOUS | Status: AC
  Administered 2019-04-14: 12:00:00 via INTRAVENOUS
  Filled 2019-04-14: qty 250

## 2019-04-14 MED ORDER — HEPARIN SOD (PORK) LOCK FLUSH 100 UNIT/ML IV SOLN
500.0000 [IU] | Freq: Once | INTRAVENOUS | Status: AC | PRN
  Administered 2019-04-14: 500 [IU]
  Filled 2019-04-14: qty 5

## 2019-04-14 MED ORDER — SODIUM CHLORIDE 0.9% FLUSH
10.0000 mL | INTRAVENOUS | Status: DC | PRN
  Administered 2019-04-14: 10 mL
  Filled 2019-04-14: qty 10

## 2019-04-14 MED ORDER — LORAZEPAM 0.5 MG PO TABS: 0.5000 mg | ORAL_TABLET | Freq: Every evening | ORAL | 3 refills | Status: DC | PRN

## 2019-04-14 MED ORDER — BUPROPION HCL ER (XL) 150 MG PO TB24: 150.0000 mg | ORAL_TABLET | Freq: Every day | ORAL | 6 refills | Status: DC

## 2019-04-14 MED ORDER — ACETAMINOPHEN 325 MG PO TABS
650.0000 mg | ORAL_TABLET | Freq: Once | ORAL | Status: AC
  Administered 2019-04-14: 650 mg via ORAL

## 2019-04-14 MED ORDER — DIPHENHYDRAMINE HCL 25 MG PO CAPS
50.0000 mg | ORAL_CAPSULE | Freq: Once | ORAL | Status: AC
  Administered 2019-04-14: 12:00:00 50 mg via ORAL

## 2019-04-14 MED ORDER — TRASTUZUMAB-DKST CHEMO 150 MG IV SOLR
450.0000 mg | Freq: Once | INTRAVENOUS | Status: AC
  Administered 2019-04-14: 450 mg via INTRAVENOUS
  Filled 2019-04-14: qty 21.43

## 2019-04-14 MED ORDER — ACETAMINOPHEN 325 MG PO TABS
ORAL_TABLET | ORAL | Status: AC
  Filled 2019-04-14: qty 2

## 2019-04-14 MED ORDER — DIPHENHYDRAMINE HCL 25 MG PO CAPS
ORAL_CAPSULE | ORAL | Status: AC
  Filled 2019-04-14: qty 2

## 2019-04-14 NOTE — Assessment & Plan Note (Signed)
10/11/2018:Patient palpated a right breast mass, mammogram revealed 1.6 cm mass, biopsy revealed grade 3 invasive ductal carcinoma ER 0%, PR 0%, Ki-67 20%, HER-2 3+ positive, T1CN0 stage IA  Treatment plan: 1. Neoadjuvant Taxol and Herceptin weekly x12. Followed by Herceptin maintenance therapy every 3 weeks to complete 1 year.  10/29/2018-01/26/2019 2.Breast conserving surgery with sentinel lymph node biopsy 02/22/2019:Bilateral mastectomies ([name]) Left breast: benign tissue and no evidence of malignancy Right breast: no residual carcinoma, 2 right axillary lymph nodes negative  3.Adjuvant radiation therapy Upbeat clinical trial: No adverse effects from participating in the trial Lynch syndrome positive on genetic testing (I discussed with her about aspirin 600 mg for prevention of colon cancer risk: Study published in Lancet) ---------------------------------------------------------------------------------------------------------------------------------------- Depression/anxiety/panic attacks:OnWellbutrin. Adjuvant XRT Treatment plan: Continue Herceptin maintenance.  Chemo-induced peripheral neuropathy:   RTC in 3 weeks for Herceptin and every 6 weeks for MD follow up. 

## 2019-04-14 NOTE — Patient Instructions (Signed)
[facility] °Discharge Instructions for Patients Receiving Chemotherapy ° °Today you received the following chemotherapy agents Trastuzumab ° °To help prevent nausea and vomiting after your treatment, we encourage you to take your nausea medication as directed. °  °If you develop nausea and vomiting that is not controlled by your nausea medication, call the clinic.  ° °BELOW ARE SYMPTOMS THAT SHOULD BE REPORTED IMMEDIATELY: °· *FEVER GREATER THAN 100.5 F °· *CHILLS WITH OR WITHOUT FEVER °· NAUSEA AND VOMITING THAT IS NOT CONTROLLED WITH YOUR NAUSEA MEDICATION °· *UNUSUAL SHORTNESS OF BREATH °· *UNUSUAL BRUISING OR BLEEDING °· TENDERNESS IN MOUTH AND THROAT WITH OR WITHOUT PRESENCE OF ULCERS °· *URINARY PROBLEMS °· *BOWEL PROBLEMS °· UNUSUAL RASH °Items with * indicate a potential emergency and should be followed up as soon as possible. ° °Feel free to call the clinic should you have any questions or concerns. The clinic phone number is [phone]. ° °Please show the CHEMO ALERT CARD at check-in to the Emergency Department and triage nurse. ° ° °

## 2019-04-14 NOTE — Therapy (Signed)
[place], [place], [address] Phone: [phone]   Fax:  [phone]  Physical Therapy Treatment  Patient Details  Name: [name] MRN: [id number] Date of Birth: 11-04-1980 Referring Provider (PT): Ave Filter Date: 04/14/2019  PT End of Session - 04/14/19 0859    Visit Number  3    Number of Visits  9    Date for PT Re-Evaluation  05/05/19    PT Start Time  0811    PT Stop Time  0850    PT Time Calculation (min)  39 min    Activity Tolerance  Patient tolerated treatment well    Behavior During Therapy  [facility] for tasks assessed/performed       Past Medical History:  Diagnosis Date  . Anxiety    over cancer diagnosis  . Arthritis   . Breast cancer ([place])    right breast cancer chemo finished per pt  . Family history of breast cancer   . Lynch syndrome   . Scleroderma [facility])     Past Surgical History:  Procedure Laterality Date  . BREAST RECONSTRUCTION WITH PLACEMENT OF TISSUE EXPANDER AND FLEX HD (ACELLULAR HYDRATED DERMIS) Bilateral 02/22/2019   Procedure: BILATERAL BREAST RECONSTRUCTION WITH PLACEMENT OF TISSUE EXPANDER AND FLEX HD (ACELLULAR HYDRATED DERMIS);  Surgeon: Irene Limbo, MD;  Location: [place];  Service: Plastics;  Laterality: Bilateral;  . CERVICAL FUSION  2019  . CESAREAN SECTION  2005  . CESAREAN SECTION  02/27/2011   Procedure: CESAREAN SECTION;  Surgeon: Shon Millet II;  Location: [facility];  Service: Gynecology;  Laterality: N/A;  repeat  . NIPPLE SPARING MASTECTOMY WITH SENTINEL LYMPH NODE BIOPSY Bilateral 02/22/2019   Procedure: RIGHT NIPPLE SPARING MASTECTOMY AND LEFT RISK REDUCING NIPPLE SPARING MASTECTOMY WITH RIGHT AXILLARY SENTINEL LYMPH NODE BIOPSY;  Surgeon: Rolm Bookbinder, MD;  Location: [place];  Service: General;  Laterality: Bilateral;  BILATERAL PECTORAL BLOCK  . PORTACATH PLACEMENT Right 10/20/2018    Procedure: INSERTION PORT-A-CATH WITH ULTRASOUND;  Surgeon: Rolm Bookbinder, MD;  Location: [place];  Service: General;  Laterality: Right;    There were no vitals filed for this visit.  Subjective Assessment - 04/14/19 0812    Subjective  I have not had a spasm since last week.    Pertinent History  R nipple sparing mastectomy, L risk reducing nipple sparing mastectomy with R SLNB 02/22/19 with immediate reconstruction, completed chemo from 10/29/18-01/26/19, cervical fusion 2019    Patient Stated Goals  to be educated about lymphedema and decrease risk    Currently in Pain?  No/denies    Pain Score  0-No pain                       [facility] Adult PT Treatment/Exercise - 04/14/19 0001      Manual Therapy   Edema Management  issued script to pt with info on how to obtain prophylactic compression sleeve and glove    Soft tissue mobilization  in left sidelying to right lateral trunk, lateral breast and area just inferior to breast in area of tightness to decrease discomfort                  PT Long Term Goals - 04/07/19 1449      PT LONG TERM GOAL #1   Title  Pt will be independent in a home exercise program for continued strengthening and stretching.  Time  4    Period  Weeks    Status  New    Target Date  05/05/19      PT LONG TERM GOAL #2   Title  Pt will be able to reach behind back with no muscle spasms or discomfort.    Time  4    Period  Weeks    Status  New    Target Date  05/05/19      PT LONG TERM GOAL #3   Title  Pt will receive prophylactic compression sleeve and gauntlet to try and minimize risk of developing lymphedema.    Time  4    Period  Weeks    Status  New    Target Date  05/05/19      PT LONG TERM GOAL #4   Title  Pt will be independent in self MLD for management of post surgical edema.    Time  4    Period  Weeks    Status  New    Target Date  05/05/19            Plan - 04/14/19 0859    Clinical  Impression Statement  Pt reports she has not had a spasm since last week and that the massage is helping. She has been wearing the grey foam in her bra and reports it makes the bra much more comfortable and feels it has helped with the edema at lateral trunk. Continued with soft tissue mobilization to right lateral trunk and lateral breast in area of tightness where pt has spasms.    PT Frequency  2x / week    PT Duration  4 weeks    PT Treatment/Interventions  ADLs/Self Care Home Management;Therapeutic activities;Therapeutic exercise;Patient/family education;Manual techniques;Manual lymph drainage;Compression bandaging;Scar mobilization;Passive range of motion;Taping    PT Next Visit Plan  teach self MLD and do to bilateral trunk, STM to serratus and lats bilaterally, educate more about lymphedema and when to come to therapy, eventually give Strength ABC    Consulted and Agree with Plan of Care  Patient       Patient will benefit from skilled therapeutic intervention in order to improve the following deficits and impairments:  Increased muscle spasms, Decreased range of motion, Increased edema, Postural dysfunction, Pain, Increased fascial restricitons  Visit Diagnosis: Cramp and spasm  Aftercare following surgery for neoplasm  Localized edema     Problem List Patient Active Problem List   Diagnosis Date Noted  . Breast cancer ([place]) 02/22/2019  . Chemotherapy-induced peripheral neuropathy ([place]) 01/06/2019  . Genetic testing 11/16/2018  . Port-A-Cath in place 11/03/2018  . Family history of breast cancer   . Malignant neoplasm of upper-outer quadrant of right breast in female, estrogen receptor negative ([place]) 10/19/2018    [facility]al [facility] 04/14/2019, 9:01 AM  [place], [place], [address] Phone: [phone]   Fax:  [phone]  Name: [name] MRN: [id number] Date of Birth:  05-25-1980  Manus Gunning, PT 04/14/19 9:01 AM

## 2019-04-18 ENCOUNTER — Ambulatory Visit: Payer: BC Managed Care – PPO

## 2019-04-21 ENCOUNTER — Ambulatory Visit: Payer: BC Managed Care – PPO | Admitting: Physical Therapy

## 2019-04-21 ENCOUNTER — Encounter: Payer: Self-pay | Admitting: Physical Therapy

## 2019-04-21 ENCOUNTER — Other Ambulatory Visit: Payer: Self-pay

## 2019-04-21 DIAGNOSIS — R252 Cramp and spasm: Secondary | ICD-10-CM | POA: Diagnosis not present

## 2019-04-21 DIAGNOSIS — R600 Localized edema: Secondary | ICD-10-CM | POA: Diagnosis not present

## 2019-04-21 DIAGNOSIS — Z483 Aftercare following surgery for neoplasm: Secondary | ICD-10-CM

## 2019-04-21 DIAGNOSIS — R293 Abnormal posture: Secondary | ICD-10-CM | POA: Diagnosis not present

## 2019-04-21 NOTE — Therapy (Signed)
[place], [place], [address] Phone: [phone]   Fax:  [phone]  Physical Therapy Treatment  Patient Details  Name: [name] MRN: [id number] Date of Birth: 1981-04-08 Referring Provider (PT): Ave Filter Date: 04/21/2019  PT End of Session - 04/21/19 1556    Visit Number  4    Number of Visits  9    Date for PT Re-Evaluation  05/05/19    PT Start Time  1501    PT Stop Time  1555    PT Time Calculation (min)  54 min    Activity Tolerance  Patient tolerated treatment well    Behavior During Therapy  [facility] for tasks assessed/performed       Past Medical History:  Diagnosis Date  . Anxiety    over cancer diagnosis  . Arthritis   . Breast cancer ([place])    right breast cancer chemo finished per pt  . Family history of breast cancer   . Lynch syndrome   . Scleroderma [facility])     Past Surgical History:  Procedure Laterality Date  . BREAST RECONSTRUCTION WITH PLACEMENT OF TISSUE EXPANDER AND FLEX HD (ACELLULAR HYDRATED DERMIS) Bilateral 02/22/2019   Procedure: BILATERAL BREAST RECONSTRUCTION WITH PLACEMENT OF TISSUE EXPANDER AND FLEX HD (ACELLULAR HYDRATED DERMIS);  Surgeon: Irene Limbo, MD;  Location: [facility];  Service: Plastics;  Laterality: Bilateral;  . CERVICAL FUSION  2019  . CESAREAN SECTION  2005  . CESAREAN SECTION  02/27/2011   Procedure: CESAREAN SECTION;  Surgeon: Shon Millet II;  Location: [facility];  Service: Gynecology;  Laterality: N/A;  repeat  . NIPPLE SPARING MASTECTOMY WITH SENTINEL LYMPH NODE BIOPSY Bilateral 02/22/2019   Procedure: RIGHT NIPPLE SPARING MASTECTOMY AND LEFT RISK REDUCING NIPPLE SPARING MASTECTOMY WITH RIGHT AXILLARY SENTINEL LYMPH NODE BIOPSY;  Surgeon: Rolm Bookbinder, MD;  Location: [place];  Service: General;  Laterality: Bilateral;  BILATERAL PECTORAL BLOCK  . PORTACATH PLACEMENT Right 10/20/2018   Procedure: INSERTION PORT-A-CATH WITH ULTRASOUND;  Surgeon: Rolm Bookbinder, MD;  Location: [place];  Service: General;  Laterality: Right;    There were no vitals filed for this visit.  Subjective Assessment - 04/21/19 1502    Subjective  Spasms are better but I have been having shooting pain down the back of my arm.    Pertinent History  R nipple sparing mastectomy, L risk reducing nipple sparing mastectomy with R SLNB 02/22/19 with immediate reconstruction, completed chemo from 10/29/18-01/26/19, cervical fusion 2019    Patient Stated Goals  to be educated about lymphedema and decrease risk    Currently in Pain?  Yes    Pain Score  2     Pain Location  Arm    Pain Orientation  Posterior;Upper    Pain Descriptors / Indicators  Aching    Pain Type  Acute pain    Pain Onset  In the past 7 days    Pain Frequency  Intermittent                       [facility] Adult PT Treatment/Exercise - 04/21/19 0001      Manual Therapy   Manual Therapy  Manual Lymphatic Drainage (MLD);Soft tissue mobilization    Soft tissue mobilization  in left sidelying to right lateral trunk, lateral edge of lats, posterior shoulder, lateral breast and area just inferior to breast in area of tightness to decrease discomfort  Manual Lymphatic Drainage (MLD)  educated pt and had her return demonstrate correct technique as follows: short neck, 5 diaphragmatic breaths, right inguinal nodes and establishment of axillo inguinal pathway, right lateral trunk moving fluid towards pathway then retracing all steps- educated pt on physiology of lymphatic system throughout                  PT Long Term Goals - 04/07/19 1449      PT LONG TERM GOAL #1   Title  Pt will be independent in a home exercise program for continued strengthening and stretching.    Time  4    Period  Weeks    Status  New    Target Date  05/05/19      PT LONG TERM GOAL #2   Title  Pt will be able to reach  behind back with no muscle spasms or discomfort.    Time  4    Period  Weeks    Status  New    Target Date  05/05/19      PT LONG TERM GOAL #3   Title  Pt will receive prophylactic compression sleeve and gauntlet to try and minimize risk of developing lymphedema.    Time  4    Period  Weeks    Status  New    Target Date  05/05/19      PT LONG TERM GOAL #4   Title  Pt will be independent in self MLD for management of post surgical edema.    Time  4    Period  Weeks    Status  New    Target Date  05/05/19            Plan - 04/21/19 1557    Clinical Impression Statement  Instructed pt in self MLD for bilateral trunk and had her return demonstrate correct technique and sequence. Pt felt independent with this at end of session. She has been having shooting pains in her right arm most likely due to nerves "waking back up" after surgery. Continued soft tissue mobilization to right lateral trunk and posterior shoulder in area of tightness with release felt.    PT Frequency  2x / week    PT Duration  4 weeks    PT Treatment/Interventions  ADLs/Self Care Home Management;Therapeutic activities;Therapeutic exercise;Patient/family education;Manual techniques;Manual lymph drainage;Compression bandaging;Scar mobilization;Passive range of motion;Taping    PT Next Visit Plan  assess indep with self MLD and do to bilateral trunk, STM to serratus and lats bilaterally, educate more about lymphedema and when to come to therapy, eventually give Strength ABC    Consulted and Agree with Plan of Care  Patient       Patient will benefit from skilled therapeutic intervention in order to improve the following deficits and impairments:  Increased muscle spasms, Decreased range of motion, Increased edema, Postural dysfunction, Pain, Increased fascial restricitons  Visit Diagnosis: Cramp and spasm  Aftercare following surgery for neoplasm  Localized edema     Problem List Patient Active Problem  List   Diagnosis Date Noted  . Breast cancer ([place]) 02/22/2019  . Chemotherapy-induced peripheral neuropathy ([place]) 01/06/2019  . Genetic testing 11/16/2018  . Port-A-Cath in place 11/03/2018  . Family history of breast cancer   . Malignant neoplasm of upper-outer quadrant of right breast in female, estrogen receptor negative ([place]) 10/19/2018    Allyson Sabal [facility] 04/21/2019, 3:59 PM  [place],  [place], [address] Phone: [phone]   Fax:  [phone]  Name: DIANDRA HEDEEN MRN: [id number] Date of Birth: 26-Feb-1981  Manus Gunning, PT 04/21/19 3:59 PM'

## 2019-04-23 ENCOUNTER — Other Ambulatory Visit: Payer: Self-pay | Admitting: Hematology and Oncology

## 2019-04-25 ENCOUNTER — Other Ambulatory Visit: Payer: Self-pay | Admitting: Hematology and Oncology

## 2019-04-25 ENCOUNTER — Ambulatory Visit: Payer: BC Managed Care – PPO | Admitting: Physical Therapy

## 2019-04-25 ENCOUNTER — Other Ambulatory Visit: Payer: Self-pay

## 2019-04-25 ENCOUNTER — Encounter: Payer: Self-pay | Admitting: Physical Therapy

## 2019-04-25 DIAGNOSIS — Z483 Aftercare following surgery for neoplasm: Secondary | ICD-10-CM

## 2019-04-25 DIAGNOSIS — R293 Abnormal posture: Secondary | ICD-10-CM | POA: Diagnosis not present

## 2019-04-25 DIAGNOSIS — R252 Cramp and spasm: Secondary | ICD-10-CM | POA: Diagnosis not present

## 2019-04-25 DIAGNOSIS — R600 Localized edema: Secondary | ICD-10-CM | POA: Diagnosis not present

## 2019-04-25 MED ORDER — BUPROPION HCL ER (XL) 150 MG PO TB24: 150.0000 mg | ORAL_TABLET | Freq: Every day | ORAL | 6 refills | Status: DC

## 2019-04-25 NOTE — Therapy (Signed)
[place], [place], [address] Phone: [phone]   Fax:  [phone]  Physical Therapy Treatment  Patient Details  Name: [name] MRN: [id number] Date of Birth: [birth year] Referring Provider (PT): [name]   Encounter Date: 04/25/2019  PT End of Session - 04/25/19 1501    Visit Number  5    Number of Visits  9    Date for PT Re-Evaluation  05/05/19    PT Start Time  1400    PT Stop Time  1455    PT Time Calculation (min)  55 min    Activity Tolerance  Patient tolerated treatment well    Behavior During Therapy  [facility] for tasks assessed/performed       Past Medical History:  Diagnosis Date  . Anxiety    over cancer diagnosis  . Arthritis   . Breast cancer ([place])    right breast cancer chemo finished per pt  . Family history of breast cancer   . Lynch syndrome   . Scleroderma [facility])     Past Surgical History:  Procedure Laterality Date  . BREAST RECONSTRUCTION WITH PLACEMENT OF TISSUE EXPANDER AND FLEX HD (ACELLULAR HYDRATED DERMIS) Bilateral 02/22/2019   Procedure: BILATERAL BREAST RECONSTRUCTION WITH PLACEMENT OF TISSUE EXPANDER AND FLEX HD (ACELLULAR HYDRATED DERMIS);  Surgeon: Irene Limbo, MD;  Location: [place];  Service: Plastics;  Laterality: Bilateral;  . CERVICAL FUSION  2019  . CESAREAN SECTION  2005  . CESAREAN SECTION  02/27/2011   Procedure: CESAREAN SECTION;  Surgeon: Shon Millet II;  Location: [facility];  Service: Gynecology;  Laterality: N/A;  repeat  . NIPPLE SPARING MASTECTOMY WITH SENTINEL LYMPH NODE BIOPSY Bilateral 02/22/2019   Procedure: RIGHT NIPPLE SPARING MASTECTOMY AND LEFT RISK REDUCING NIPPLE SPARING MASTECTOMY WITH RIGHT AXILLARY SENTINEL LYMPH NODE BIOPSY;  Surgeon: Rolm Bookbinder, MD;  Location: [place];  Service: General;  Laterality: Bilateral;  BILATERAL PECTORAL BLOCK  . PORTACATH PLACEMENT Right 10/20/2018   Procedure: INSERTION PORT-A-CATH WITH ULTRASOUND;  Surgeon: Rolm Bookbinder, MD;  Location: [place];  Service: General;  Laterality: Right;    There were no vitals filed for this visit.  Subjective Assessment - 04/25/19 1403    Subjective  The tightness is still there but the spams are better.    Pertinent History  R nipple sparing mastectomy, L risk reducing nipple sparing mastectomy with R SLNB 02/22/19 with immediate reconstruction, completed chemo from 10/29/18-01/26/19, cervical fusion 2019    Patient Stated Goals  to be educated about lymphedema and decrease risk    Currently in Pain?  No/denies    Pain Score  0-No pain                       [facility] Adult PT Treatment/Exercise - 04/25/19 0001      Manual Therapy   Soft tissue mobilization  in left sidelying to right lateral trunk, lateral edge of lats, posterior shoulder, lateral breast and area just inferior to breast in area of tightness to decrease discomfort                  PT Long Term Goals - 04/07/19 1449      PT LONG TERM GOAL #1   Title  Pt will be independent in a home exercise program for continued strengthening and stretching.    Time  4    Period  Weeks  Status  New    Target Date  05/05/19      PT LONG TERM GOAL #2   Title  Pt will be able to reach behind back with no muscle spasms or discomfort.    Time  4    Period  Weeks    Status  New    Target Date  05/05/19      PT LONG TERM GOAL #3   Title  Pt will receive prophylactic compression sleeve and gauntlet to try and minimize risk of developing lymphedema.    Time  4    Period  Weeks    Status  New    Target Date  05/05/19      PT LONG TERM GOAL #4   Title  Pt will be independent in self MLD for management of post surgical edema.    Time  4    Period  Weeks    Status  New    Target Date  05/05/19            Plan - 04/25/19 1501    Clinical Impression Statement  Continued with soft tissue  mobilization to right lateral trunk in area of tightness. Pt still has knots palpable in this area but they did relax with soft tissue mobilization. She feels indpendent with self MLD now. Will instruct pt in Strength ABC program at next session to prepare pt for d/c from PT.    PT Treatment/Interventions  ADLs/Self Care Home Management;Therapeutic activities;Therapeutic exercise;Patient/family education;Manual techniques;Manual lymph drainage;Compression bandaging;Scar mobilization;Passive range of motion;Taping    PT Next Visit Plan  instruct in  Strength ABC program    Consulted and Agree with Plan of Care  Patient       Patient will benefit from skilled therapeutic intervention in order to improve the following deficits and impairments:  Increased muscle spasms, Decreased range of motion, Increased edema, Postural dysfunction, Pain, Increased fascial restricitons  Visit Diagnosis: Aftercare following surgery for neoplasm     Problem List Patient Active Problem List   Diagnosis Date Noted  . Breast cancer ([place]) 02/22/2019  . Chemotherapy-induced peripheral neuropathy ([place]) 01/06/2019  . Genetic testing 11/16/2018  . Port-A-Cath in place 11/03/2018  . Family history of breast cancer   . Malignant neoplasm of upper-outer quadrant of right breast in female, estrogen receptor negative ([place]) 10/19/2018    Allyson Sabal [facility] 04/25/2019, 3:04 PM  [place], [place], [address] Phone: [phone]   Fax:  [phone]  Name: [name] MRN: [id number] Date of Birth: 08/25/1980  Manus Gunning, PT 04/25/19 3:04 PM

## 2019-04-25 NOTE — Progress Notes (Signed)
Wellbutrin prescription has been sent to the pharmacy

## 2019-04-26 NOTE — Telephone Encounter (Signed)
Please refuse as you have already filled

## 2019-04-27 ENCOUNTER — Other Ambulatory Visit: Payer: Self-pay

## 2019-04-27 ENCOUNTER — Ambulatory Visit: Payer: BC Managed Care – PPO

## 2019-04-27 DIAGNOSIS — R252 Cramp and spasm: Secondary | ICD-10-CM

## 2019-04-27 DIAGNOSIS — R600 Localized edema: Secondary | ICD-10-CM

## 2019-04-27 DIAGNOSIS — Z483 Aftercare following surgery for neoplasm: Secondary | ICD-10-CM

## 2019-04-27 DIAGNOSIS — R293 Abnormal posture: Secondary | ICD-10-CM

## 2019-04-27 NOTE — Therapy (Signed)
[place], [place], [address] Phone: [phone]   Fax:  [phone]  Physical Therapy Treatment  Patient Details  Name: [name] MRN: [id number] Date of Birth: 15-Apr-1981 Referring Provider (PT): [name]   Encounter Date: 04/27/2019  PT End of Session - 04/27/19 1311    Visit Number  6    Number of Visits  9    Date for PT Re-Evaluation  05/05/19    PT Start Time  1310    PT Stop Time  1403    PT Time Calculation (min)  53 min    Activity Tolerance  Patient tolerated treatment well    Behavior During Therapy  [facility] for tasks assessed/performed       Past Medical History:  Diagnosis Date  . Anxiety    over cancer diagnosis  . Arthritis   . Breast cancer ([place])    right breast cancer chemo finished per pt  . Family history of breast cancer   . Lynch syndrome   . Scleroderma [facility])     Past Surgical History:  Procedure Laterality Date  . BREAST RECONSTRUCTION WITH PLACEMENT OF TISSUE EXPANDER AND FLEX HD (ACELLULAR HYDRATED DERMIS) Bilateral 02/22/2019   Procedure: BILATERAL BREAST RECONSTRUCTION WITH PLACEMENT OF TISSUE EXPANDER AND FLEX HD (ACELLULAR HYDRATED DERMIS);  Surgeon: Irene Limbo, MD;  Location: [place];  Service: Plastics;  Laterality: Bilateral;  . CERVICAL FUSION  2019  . CESAREAN SECTION  2005  . CESAREAN SECTION  02/27/2011   Procedure: CESAREAN SECTION;  Surgeon: Shon Millet II;  Location: [facility];  Service: Gynecology;  Laterality: N/A;  repeat  . NIPPLE SPARING MASTECTOMY WITH SENTINEL LYMPH NODE BIOPSY Bilateral 02/22/2019   Procedure: RIGHT NIPPLE SPARING MASTECTOMY AND LEFT RISK REDUCING NIPPLE SPARING MASTECTOMY WITH RIGHT AXILLARY SENTINEL LYMPH NODE BIOPSY;  Surgeon: Rolm Bookbinder, MD;  Location: [place];  Service: General;  Laterality: Bilateral;  BILATERAL PECTORAL BLOCK  . PORTACATH PLACEMENT Right 10/20/2018    Procedure: INSERTION PORT-A-CATH WITH ULTRASOUND;  Surgeon: Rolm Bookbinder, MD;  Location: [place] [place];  Service: General;  Laterality: Right;    There were no vitals filed for this visit.  Subjective Assessment - 04/27/19 1311    Subjective  Pt reports that her tightness is getting better and better. Her spasms have resolved.    Pertinent History  R nipple sparing mastectomy, L risk reducing nipple sparing mastectomy with R SLNB 02/22/19 with immediate reconstruction, completed chemo from 10/29/18-01/26/19, cervical fusion 2019    Patient Stated Goals  to be educated about lymphedema and decrease risk    Currently in Pain?  No/denies    Pain Score  0-No pain                       [facility] Adult PT Treatment/Exercise - 04/27/19 0001      Exercises   Exercises  Other Exercises    Other Exercises   Single arm pec stretch 2x 20 seconds, Posterior shoulder stretch 2x 20 seconds, tricep stretch on wall 2x 20 seconds, Calf stretch Bil LE 1x 20 seconds, Quadricep stretch Bil LE 1x 20 seconds, Butterfly stretch 2x 20 seconds, Hamstring stretch Bil LE 1x 20 seconds, Lower back rotation stretch 2x 10 seconds Bil, Pelvic tilt w/marching and core activation 10x 2 sets with VC and tactile cueing for correct activation of transverse abdominus initially pt was able to achieve. GLute bridge w/VC for  full ROM 10x, Quadruped bird dog with just alternating legs at this time due to core weakness and inablity to maintain stabilization, Sit to stand 10x with 3# in Bil hands, Bicep curls BIl 3# 10x, Shoulder extension 3# Bil 10x, Bent over row BIl UE 3# 10x, Heel raises with 3# wgts 20x, Standing shoulder flexIon and V 2#  10X each, attempted dead lift pt was unable worked on pelvic tilt w/glute squeeze at transverse abdominus activation at the wall 10x 10 second hold using towel as tactile cueing due to pt has core weakness and is unable to adequately perform dead lift, step up on 6" step  with high knee 10x BIl pt has good balance slightly weaker on LLE with tremors noted. Tricep kick back with 2# wgt no pain 10x on the RUE.              PT Education - 04/27/19 1408    Education Details  Pt will work on Medical illustrator exercises at home. Discussed core activation and the importance of form during exercise in order to decrease stress on the cervical spine due to pt previously had cervical surgery.    Person(s) Educated  Patient    Methods  Explanation;Handout;Verbal cues;Tactile cues;Demonstration    Comprehension  Verbalized understanding;Returned demonstration          PT Long Term Goals - 04/07/19 1449      PT LONG TERM GOAL #1   Title  Pt will be independent in a home exercise program for continued strengthening and stretching.    Time  4    Period  Weeks    Status  New    Target Date  05/05/19      PT LONG TERM GOAL #2   Title  Pt will be able to reach behind back with no muscle spasms or discomfort.    Time  4    Period  Weeks    Status  New    Target Date  05/05/19      PT LONG TERM GOAL #3   Title  Pt will receive prophylactic compression sleeve and gauntlet to try and minimize risk of developing lymphedema.    Time  4    Period  Weeks    Status  New    Target Date  05/05/19      PT LONG TERM GOAL #4   Title  Pt will be independent in self MLD for management of post surgical edema.    Time  4    Period  Weeks    Status  New    Target Date  05/05/19            Plan - 04/27/19 1311    Clinical Impression Statement  Pt presents to physical therapy today a little behind schedule due to traffic. She was able to go through the strength after breast cancer exercise sheet in it's entirety with modification especially for core activation activities due to weakness in the core. Pt demonstrates weakness in her LLE compared to her R but overall good strength. She was able to perform a tricep kick back w/o cramping or spasm which she reports was  prevoiusly a trigger. Pt will benefit from continued POC at this time.    Examination-Activity Limitations  Hygiene/Grooming;Toileting    Rehab Potential  Good    PT Frequency  2x / week    PT Duration  4 weeks    PT Treatment/Interventions  ADLs/Self Care Home Management;Therapeutic activities;Therapeutic  exercise;Patient/family education;Manual techniques;Manual lymph drainage;Compression bandaging;Scar mobilization;Passive range of motion;Taping    PT Next Visit Plan  ask how she felt after strength after ABC    PT Home Exercise Plan  strenght after breast cancer exercises.    Consulted and Agree with Plan of Care  Patient       Patient will benefit from skilled therapeutic intervention in order to improve the following deficits and impairments:  Increased muscle spasms, Decreased range of motion, Increased edema, Postural dysfunction, Pain, Increased fascial restricitons  Visit Diagnosis: Aftercare following surgery for neoplasm  Cramp and spasm  Localized edema  Abnormal posture     Problem List Patient Active Problem List   Diagnosis Date Noted  . Breast cancer ([place]) 02/22/2019  . Chemotherapy-induced peripheral neuropathy ([place]) 01/06/2019  . Genetic testing 11/16/2018  . Port-A-Cath in place 11/03/2018  . Family history of breast cancer   . Malignant neoplasm of upper-outer quadrant of right breast in female, estrogen receptor negative ([place]) 10/19/2018    [name], PT 04/27/2019, 2:13 PM  [place], [place], [address] Phone: [phone]   Fax:  [phone]  Name: [name] MRN: [id number] Date of Birth: 1980-06-16

## 2019-05-02 ENCOUNTER — Other Ambulatory Visit: Payer: Self-pay

## 2019-05-02 ENCOUNTER — Ambulatory Visit: Payer: BC Managed Care – PPO

## 2019-05-02 DIAGNOSIS — R293 Abnormal posture: Secondary | ICD-10-CM | POA: Diagnosis not present

## 2019-05-02 DIAGNOSIS — R600 Localized edema: Secondary | ICD-10-CM

## 2019-05-02 DIAGNOSIS — Z483 Aftercare following surgery for neoplasm: Secondary | ICD-10-CM

## 2019-05-02 DIAGNOSIS — R252 Cramp and spasm: Secondary | ICD-10-CM

## 2019-05-02 NOTE — Therapy (Signed)
[place], [place], [address] Phone: [phone]   Fax:  [phone]  Physical Therapy Discharge Note  Patient Details  Name: [name] MRN: [id number] Date of Birth: 03/21/1981 Referring Provider (PT): [name]   Encounter Date: 05/02/2019  PT End of Session - 05/02/19 1603    Visit Number  7    Number of Visits  9    Date for PT Re-Evaluation  05/05/19    PT Start Time  8366    PT Stop Time  1630    PT Time Calculation (min)  23 min    Activity Tolerance  Patient tolerated treatment well    Behavior During Therapy  [facility] for tasks assessed/performed       Past Medical History:  Diagnosis Date  . Anxiety    over cancer diagnosis  . Arthritis   . Breast cancer ([place])    right breast cancer chemo finished per pt  . Family history of breast cancer   . Lynch syndrome   . Scleroderma [facility])     Past Surgical History:  Procedure Laterality Date  . BREAST RECONSTRUCTION WITH PLACEMENT OF TISSUE EXPANDER AND FLEX HD (ACELLULAR HYDRATED DERMIS) Bilateral 02/22/2019   Procedure: BILATERAL BREAST RECONSTRUCTION WITH PLACEMENT OF TISSUE EXPANDER AND FLEX HD (ACELLULAR HYDRATED DERMIS);  Surgeon: Irene Limbo, MD;  Location: [place];  Service: Plastics;  Laterality: Bilateral;  . CERVICAL FUSION  2019  . CESAREAN SECTION  2005  . CESAREAN SECTION  02/27/2011   Procedure: CESAREAN SECTION;  Surgeon: Shon Millet II;  Location: [place] [facility];  Service: Gynecology;  Laterality: N/A;  repeat  . NIPPLE SPARING MASTECTOMY WITH SENTINEL LYMPH NODE BIOPSY Bilateral 02/22/2019   Procedure: RIGHT NIPPLE SPARING MASTECTOMY AND LEFT RISK REDUCING NIPPLE SPARING MASTECTOMY WITH RIGHT AXILLARY SENTINEL LYMPH NODE BIOPSY;  Surgeon: Rolm Bookbinder, MD;  Location: [place];  Service: General;  Laterality: Bilateral;  BILATERAL PECTORAL BLOCK  . PORTACATH PLACEMENT Right 10/20/2018    Procedure: INSERTION PORT-A-CATH WITH ULTRASOUND;  Surgeon: Rolm Bookbinder, MD;  Location: [place];  Service: General;  Laterality: Right;    There were no vitals filed for this visit.  Subjective Assessment - 05/02/19 1603    Subjective  Pt reports that she has been performing her exercises at home. She states that she has some muscular soreness in her abs and hopes this means she is doing her exercises correctly.    Pertinent History  R nipple sparing mastectomy, L risk reducing nipple sparing mastectomy with R SLNB 02/22/19 with immediate reconstruction, completed chemo from 10/29/18-01/26/19, cervical fusion 2019    Pain Score  0-No pain                               PT Education - 05/02/19 1637    Education Details  Pt was educated on reasons to wear her compression garment including during heavy work outs, hot weather or during flights including 2 hours before/after to decrease risk for edema. Discussed avoiding soaking in hot tubs and laying out in the hot sun for excessive amounts of time. Pt was educated when to perform MLD including maybe 1x/week for the first year after surgery. Discussed with patient to not wear her compression sleeve/bra or perform MLD after her reconstruction surgery and to wait until her incisions have completely healed before peforming MLD. Discussed that performing deep  breathing and axillary lymph node stimulation is okay as long as she can do this without stressing her incisions. Discussed reasons to return to physical therapy including aching, heaviness or increased edema. Pt was open to discharge today and that was discussed with patient. re-iterated education on MLD including the importance of skin stretch and direction to prevent pushing fluid toward the affected side.    Person(s) Educated  Patient    Methods  Explanation    Comprehension  Verbalized understanding          PT Long Term Goals - 05/02/19 1612       PT LONG TERM GOAL #1   Title  Pt will be independent in a home exercise program for continued strengthening and stretching.    Baseline  pt reports that she has been doing her HEP at home.    Status  Achieved      PT LONG TERM GOAL #2   Title  Pt will be able to reach behind back with no muscle spasms or discomfort.    Baseline  Pt is able to reach past her bra line with no muscles spasms in her RUE    Status  Achieved      PT LONG TERM GOAL #3   Title  Pt will receive prophylactic compression sleeve and gauntlet to try and minimize risk of developing lymphedema.    Baseline  Pt has called but has not set up an appt. She has been provided with the prescription and will go to the DME store to get a compression sleeve after the end of the year.    Status  On-going      PT LONG TERM GOAL #4   Title  Pt will be independent in self MLD for management of post surgical edema.    Baseline  pt reports that she is confident in performing MLD for managemet of post surgical edema.    Status  Achieved            Plan - 05/02/19 1603    Clinical Impression Statement  Pt presents to physical therapy today reporting that she has been performing her Strength ABC program exercises and has really noticed a difference in her core activation. She states that she feels comfortable with performing Self MLD and with returning to physical therapy if she notices any changes in her RUE/R breast including heaviness, aching or increased edema. She has met most of her goals except her compression goal but has been provided with the script and is aware of reasons to wear compression including during exercise, flights and during hot days to decrease risk for lymphedema. Pt will be discharged at this time to home management.    Examination-Activity Limitations  Hygiene/Grooming;Toileting    Rehab Potential  Good    PT Frequency  2x / week    PT Duration  4 weeks    PT Treatment/Interventions  ADLs/Self Care  Home Management;Therapeutic activities;Therapeutic exercise;Patient/family education;Manual techniques;Manual lymph drainage;Compression bandaging;Scar mobilization;Passive range of motion;Taping    PT Next Visit Plan  Pt will be discharged today    PT Home Exercise Plan  strength after breast cancer exercises.    Consulted and Agree with Plan of Care  Patient       Patient will benefit from skilled therapeutic intervention in order to improve the following deficits and impairments:  Increased muscle spasms, Decreased range of motion, Increased edema, Postural dysfunction, Pain, Increased fascial restricitons  Visit Diagnosis: Aftercare following surgery for  neoplasm  Cramp and spasm  Localized edema  Abnormal posture     Problem List Patient Active Problem List   Diagnosis Date Noted  . Breast cancer ([place]) 02/22/2019  . Chemotherapy-induced peripheral neuropathy ([place]) 01/06/2019  . Genetic testing 11/16/2018  . Port-A-Cath in place 11/03/2018  . Family history of breast cancer   . Malignant neoplasm of upper-outer quadrant of right breast in female, estrogen receptor negative ([place]) 10/19/2018   PHYSICAL THERAPY DISCHARGE SUMMARY  Plan: Patient agrees to discharge.  Patient goals were partially met. Patient is being discharged due to being pleased with the current functional level.  ?????      Ander Purpura , PT 05/02/2019, 4:41 PM  [place], [place], [address] Phone: [phone]   Fax:  [phone]  Name: [name] MRN: [id number] Date of Birth: Dec 09, 1980

## 2019-05-04 ENCOUNTER — Encounter: Payer: BC Managed Care – PPO | Admitting: Rehabilitation

## 2019-05-04 DIAGNOSIS — C50919 Malignant neoplasm of unspecified site of unspecified female breast: Secondary | ICD-10-CM | POA: Diagnosis not present

## 2019-05-04 NOTE — H&P (Signed)
Subjective:     Patient ID: [name] is a 38 y.o. female.  HPI  10 weeks post op. Scheduled for implant exchange Jan 2021.   Presented with palpable right breast mass. MMG/US demonstrated 1.6 cm mass right UOQ 7 cmfn. Axilla normal. Biopsy showed IDC, ER/PR-, Her2+  MRI showed biopsy proven malignancy in the right breast at the 10 o'clock, 1.5 x 0.9 x 1.6 cm.  Completed neoadjuvant chemotherapy with final MRI with complete response. Final pathology 0/2 SLN  Genetics with heterozygous pathogenic gene mutation inPMS2, consistent with Lynch syndrome.  Prior 33 B/C. Right mastectomy weight not recorded left 305 g  Works at [employer]. Husband works at [employer], [employer]. Two kids.   Review of Systems    Objective:   Physical Exam  Cardiovascular: Normal rate, regular rhythm and normal heart sounds.  Pulmonary/Chest: Effort normal and breath sounds normal.   Chest: soft expanded bilateral Port right chest SN to nipple R 23 L 23 cm Nipple to IMF R and L 6 cm to scar 7 cm to fold/caudal edge expander  Abd: central abdomen with striae, small amount soft tissue pinch flanks, lateral superior hips with full contour  Assessment:     Right breast cancer UOQ ER- Neoadjuvant chemotherapy Lynch syndrome S/p bilateral NSM, right SLN, prepectoral TE/ADM (Alloderm) reconstruction    Plan:     Pictures today. Plan removal bilateral TE and placement implants, lipofilling bilateral chest, port removal.   Reviewed saline vs silicone, shaped vs round. HCG or capacity filled silicone implants may offer reduced risk visible rippling. Reviewed MRI or USsurveillance for rupture with silicone implants. Reviewed examples for 4th generation, capacity filled 4th generation, and HCG implants vs saline implants. Reviewed risks AP flipping that may be more noticeable with 5th generation implants, may require surgery to correct. Reviewed textured vs smooth, former  associated with ALCL risk.Reviewed size in part guided by width chest, cannot assure her cup size. Plan silicone, plan smooth round capacity filled  Reviewed fat grafting. Reviewed purpose of this to thicken flaps limit visible rippling and aid in contour. Reviewed donor site pain, need for compression, variable take graft, fat necrosis that presents as masses and may require work up.Plan fat grafting bilateral chest. She has Spanx garment for post op use. Plan donor site lateral hips  Patient completed ASPS implant consent including implant checklist- this includes discussion implants are not permanent devices, may require additional surgery, risks contracture, rupture.  Rx for Flexeril oxycodone and bactrim given.  Natrelle [id number] 300 ml tissue expanders placed bilateral  fill volume 300 ml saline bilateral.

## 2019-05-05 ENCOUNTER — Other Ambulatory Visit: Payer: Self-pay

## 2019-05-05 ENCOUNTER — Inpatient Hospital Stay: Payer: BC Managed Care – PPO

## 2019-05-05 VITALS — BP 115/82 | HR 81 | Temp 98.6°F | Resp 18

## 2019-05-05 DIAGNOSIS — C50919 Malignant neoplasm of unspecified site of unspecified female breast: Secondary | ICD-10-CM | POA: Diagnosis not present

## 2019-05-05 DIAGNOSIS — Z171 Estrogen receptor negative status [ER-]: Secondary | ICD-10-CM | POA: Diagnosis not present

## 2019-05-05 DIAGNOSIS — C50411 Malignant neoplasm of upper-outer quadrant of right female breast: Secondary | ICD-10-CM | POA: Diagnosis not present

## 2019-05-05 DIAGNOSIS — Z79899 Other long term (current) drug therapy: Secondary | ICD-10-CM | POA: Diagnosis not present

## 2019-05-05 DIAGNOSIS — Z5112 Encounter for antineoplastic immunotherapy: Secondary | ICD-10-CM | POA: Diagnosis not present

## 2019-05-05 MED ORDER — TRASTUZUMAB-DKST CHEMO 150 MG IV SOLR
450.0000 mg | Freq: Once | INTRAVENOUS | Status: AC
  Administered 2019-05-05: 450 mg via INTRAVENOUS
  Filled 2019-05-05: qty 21.43

## 2019-05-05 MED ORDER — DIPHENHYDRAMINE HCL 25 MG PO CAPS
ORAL_CAPSULE | ORAL | Status: AC
  Filled 2019-05-05: qty 2

## 2019-05-05 MED ORDER — DIPHENHYDRAMINE HCL 25 MG PO CAPS
50.0000 mg | ORAL_CAPSULE | Freq: Once | ORAL | Status: AC
  Administered 2019-05-05: 50 mg via ORAL

## 2019-05-05 MED ORDER — HEPARIN SOD (PORK) LOCK FLUSH 100 UNIT/ML IV SOLN
500.0000 [IU] | Freq: Once | INTRAVENOUS | Status: AC | PRN
  Administered 2019-05-05: 500 [IU]
  Filled 2019-05-05: qty 5

## 2019-05-05 MED ORDER — SODIUM CHLORIDE 0.9 % IV SOLN
Freq: Once | INTRAVENOUS | Status: AC
  Filled 2019-05-05: qty 250

## 2019-05-05 MED ORDER — SODIUM CHLORIDE 0.9% FLUSH
10.0000 mL | INTRAVENOUS | Status: DC | PRN
  Administered 2019-05-05: 10 mL
  Filled 2019-05-05: qty 10

## 2019-05-05 MED ORDER — ACETAMINOPHEN 325 MG PO TABS
650.0000 mg | ORAL_TABLET | Freq: Once | ORAL | Status: AC
  Administered 2019-05-05: 650 mg via ORAL

## 2019-05-05 MED ORDER — ACETAMINOPHEN 325 MG PO TABS
ORAL_TABLET | ORAL | Status: AC
  Filled 2019-05-05: qty 2

## 2019-05-05 NOTE — Patient Instructions (Signed)
[facility] Discharge Instructions for Patients Receiving Chemotherapy  Today you received the following chemotherapy agents: Trastuzumab   To help prevent nausea and vomiting after your treatment, we encourage you to take your nausea medication  as prescribed.    If you develop nausea and vomiting that is not controlled by your nausea medication, call the clinic.   BELOW ARE SYMPTOMS THAT SHOULD BE REPORTED IMMEDIATELY:  *FEVER GREATER THAN 100.5 F  *CHILLS WITH OR WITHOUT FEVER  NAUSEA AND VOMITING THAT IS NOT CONTROLLED WITH YOUR NAUSEA MEDICATION  *UNUSUAL SHORTNESS OF BREATH  *UNUSUAL BRUISING OR BLEEDING  TENDERNESS IN MOUTH AND THROAT WITH OR WITHOUT PRESENCE OF ULCERS  *URINARY PROBLEMS  *BOWEL PROBLEMS  UNUSUAL RASH Items with * indicate a potential emergency and should be followed up as soon as possible.  Feel free to call the clinic should you have any questions or concerns. The clinic phone number is [phone].  Please show the CHEMO ALERT CARD at check-in to the Emergency Department and triage nurse.   

## 2019-05-24 ENCOUNTER — Encounter (HOSPITAL_BASED_OUTPATIENT_CLINIC_OR_DEPARTMENT_OTHER): Payer: Self-pay | Admitting: Plastic Surgery

## 2019-05-24 ENCOUNTER — Other Ambulatory Visit: Payer: Self-pay

## 2019-05-25 ENCOUNTER — Encounter: Payer: Self-pay | Admitting: *Deleted

## 2019-05-25 NOTE — Progress Notes (Signed)
Patient Care Team: Redmon, Barth Kirks, PA-C as PCP - General (Nurse Practitioner)  DIAGNOSIS:    ICD-10-CM   1. Malignant neoplasm of upper-outer quadrant of right breast in female, estrogen receptor negative ([place])  C50.411    Z17.1     SUMMARY OF ONCOLOGIC HISTORY: Oncology History  Malignant neoplasm of upper-outer quadrant of right breast in female, estrogen receptor negative ([place])  10/11/2018 Initial Diagnosis   Patient palpated a right breast mass, mammogram revealed 1.6 cm mass, biopsy revealed grade 3 invasive ductal carcinoma ER 0%, PR 0%, Ki-67 20%, HER-2 3+ positive, T1CN0 stage IA   10/19/2018 Cancer Staging   Staging form: Breast, AJCC 8th Edition - Clinical stage from 10/19/2018: Stage IA (cT1c, cN0, cM0, G3, ER-, PR-, HER2+) - Signed by [name], MD on 10/19/2018   10/29/2018 - 01/26/2019 Chemotherapy   The patient had trastuzumab (HERCEPTIN) 300 mg in sodium chloride 0.9 % 250 mL chemo infusion, 294 mg, Intravenous,  Once, 2 of 2 cycles Administration: 300 mg (10/29/2018), 150 mg (11/03/2018), 150 mg (12/02/2018), 150 mg (11/12/2018), 150 mg (11/25/2018), 150 mg (12/09/2018) PACLitaxel (TAXOL) 150 mg in sodium chloride 0.9 % 250 mL chemo infusion (</= 39m/m2), 80 mg/m2 = 150 mg, Intravenous,  Once, 3 of 3 cycles Dose modification: 65 mg/m2 (original dose 80 mg/m2, Cycle 3, Reason: Dose not tolerated) Administration: 150 mg (10/29/2018), 150 mg (11/03/2018), 150 mg (12/02/2018), 150 mg (11/12/2018), 150 mg (11/25/2018), 150 mg (12/09/2018), 150 mg (12/16/2018), 150 mg (12/23/2018), 120 mg (12/30/2018), 120 mg (01/06/2019), 120 mg (01/13/2019), 120 mg (01/20/2019) trastuzumab-dkst (OGIVRI) 150 mg in sodium chloride 0.9 % 250 mL chemo infusion, 150 mg (100 % of original dose 150 mg), Intravenous,  Once, 2 of 2 cycles Dose modification: 150 mg (original dose 150 mg, Cycle 2, Reason: Other (see comments), Comment: Biosimilar Conversion), 450 mg (original dose 150 mg, Cycle 3, Reason: Other (see  comments), Comment: Biosimilar Conversion) Administration: 150 mg (12/16/2018), 150 mg (12/23/2018), 150 mg (12/30/2018), 150 mg (01/06/2019), 150 mg (01/13/2019), 450 mg (01/20/2019)  for chemotherapy treatment.    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  The Common Hereditary Gene Panel offered by [facility] includes sequencing and/or deletion duplication testing of the following 48 genes: APC, ATM, AXIN2, BARD1, BMPR1A, BRCA1, BRCA2, BRIP1, CDH1, CDK4, CDKN2A (p14ARF), CDKN2A (p16INK4a), CHEK2, CTNNA1, DICER1, EPCAM (Deletion/duplication testing only), GREM1 (promoter region deletion/duplication testing only), KIT, MEN1, MLH1, MSH2, MSH3, MSH6, MUTYH, NBN, NF1, NHTL1, PALB2, PDGFRA, PMS2, POLD1, POLE, PTEN, RAD50, RAD51C, RAD51D, RNF43, SDHB, SDHC, SDHD, SMAD4, SMARCA4. STK11, TP53, TSC1, TSC2, and VHL.  The following genes were evaluated for sequence changes only: SDHA and HOXB13 c.251G>A variant only.   Two VUS's also found - one in ATM c.133C>T and the other in POLE c.1280C>T.  These will not affect medical management. The report date is November 12, 2018.   02/10/2019 -  Chemotherapy   The patient had trastuzumab-dkst (OGIVRI) 462 mg in sodium chloride 0.9 % 250 mL chemo infusion, 6 mg/kg = 630 mg, Intravenous,  Once, 5 of 12 cycles Administration: 450 mg (03/03/2019), 450 mg (05/05/2019), 450 mg (03/24/2019), 450 mg (04/14/2019)  for chemotherapy treatment.    02/22/2019 Surgery   Bilateral mastectomies ([name]) Left breast: benign tissue and no evidence of malignancy Right breast: no residual carcinoma, 2 right axillary lymph nodes negative      CHIEF COMPLIANT: Follow-up of Herceptin maintenance   INTERVAL HISTORY: Teresa  B Farrell is a 39 y.o. with above-mentioned history of right breast cancerwho competedneoadjuvant chemotherapy, underwent bilateral mastectomies, and is currently on Herceptin  maintenance. She is a participant in the UpBeat clinical trial.She presents to the clinic todayfor treatment.    ALLERGIES:  is allergic to methocarbamol.  MEDICATIONS:  Current Outpatient Medications  Medication Sig Dispense Refill  . buPROPion (WELLBUTRIN XL) 150 MG 24 hr tablet Take 1 tablet (150 mg total) by mouth daily. 30 tablet 6  . cyclobenzaprine (FEXMID) 7.5 MG tablet     . LORazepam (ATIVAN) 0.5 MG tablet Take 1 tablet (0.5 mg total) by mouth at bedtime as needed for anxiety. 30 tablet 3  . sulfamethoxazole-trimethoprim (BACTRIM DS) 800-160 MG tablet Take by mouth.     No current facility-administered medications for this visit.    PHYSICAL EXAMINATION: ECOG PERFORMANCE STATUS: 1 - Symptomatic but completely ambulatory  Vitals:   05/26/19 1002  BP: 137/79  Pulse: 64  Resp: 18  Temp: 98 F (36.7 C)  SpO2: 100%   Filed Weights   05/26/19 1002  Weight: 175 lb 6.4 oz (79.6 kg)    LABORATORY DATA:  I have reviewed the data as listed CMP Latest Ref Rng & Units 04/14/2019 03/03/2019 01/20/2019  Glucose 70 - 99 mg/dL 90 79 99  BUN 6 - 20 mg/dL _0 Creatinine 0.44 - 1.00 mg/dL 0.83 0.76 0.81  Sodium 135 - 145 mmol/L 141 140 141  Potassium 3.5 - 5.1 mmol/L 4.3 4.7 4.3  Chloride 98 - 111 mmol/L 107 107 107  CO2 22 - 32 mmol/L _1 Calcium 8.9 - 10.3 mg/dL 9.0 8.8(L) 9.4  Total Protein 6.5 - 8.1 g/dL 6.9 6.5 7.0  Total Bilirubin 0.3 - 1.2 mg/dL 0.3 <0.2(L) 0.3  Alkaline Phos 38 - 126 U/L 58 72 68  AST 15 - 41 U/L 14(L) 34 11(L)  ALT 0 - 44 U/L 16 81(H) 13    Lab Results  Component Value Date   WBC 6.0 04/14/2019   HGB 12.2 04/14/2019   HCT 37.4 04/14/2019   MCV 92.3 04/14/2019   PLT 228 04/14/2019   NEUTROABS 4.0 04/14/2019    ASSESSMENT & PLAN:  Malignant neoplasm of upper-outer quadrant of right breast in female, estrogen receptor negative ([name]) 10/11/2018:Patient palpated a right breast mass, mammogram revealed 1.6 cm mass, biopsy revealed  grade 3 invasive ductal carcinoma ER 0%, PR 0%, Ki-67 20%, HER-2 3+ positive, T1CN0 stage IA  Treatment plan: 1. Neoadjuvant Taxol and Herceptin weekly x12. Followed by Herceptin maintenance therapy every 3 weeks to complete 1 year.10/29/2018-01/26/2019 2.Breast conserving surgery with sentinel lymph node biopsy10/20/2020:Bilateral mastectomies [name]) Left breast: benign tissue and no evidence of malignancy Right breast: no residual carcinoma, 2 right axillary lymph nodes negative 3.Adjuvant radiation therapy Upbeat clinical trial: No adverse effects from participating in the trial Lynch syndrome positive on genetic testing (I discussed with her about aspirin 600 mg for prevention of colon cancer risk: Study published in Lancet) ---------------------------------------------------------------------------------------------------------------------------------------- Depression/anxiety/panic attacks:On Wellbutrin doing better Current treatment: Herceptin maintenance Chemo-induced peripheral neuropathy: No further problems  Port will be taken out during her reconstruction surgery in January.  Subsequently she will receive subcutaneous Herceptin.  We will be switching her from IV Herceptin to subcutaneous Herceptin and the port will be removed. Her last treatment will be on October 20, 2019. Since we do not need labs for Herceptin treatments we will cancel further lab appointments.  Return to clinic every 3  weeks for Herceptin every 6 weeks for follow-up with me with labs  No orders of the defined types were placed in this encounter.  The patient has a good understanding of the overall plan. she agrees with it. she will call with any problems that may develop before the next visit here.  Total time spent: 30 mins including face to face time and time spent for planning, charting and coordination of care  Gudena, Vinay, MD 05/26/2019  I, Molly Dorshimer, am acting as  scribe for Dr. Vinay Gudena.  I have reviewed the above documentation for accuracy and completeness, and I agree with the above.       

## 2019-05-26 ENCOUNTER — Other Ambulatory Visit: Payer: Self-pay

## 2019-05-26 ENCOUNTER — Inpatient Hospital Stay: Payer: BC Managed Care – PPO

## 2019-05-26 ENCOUNTER — Inpatient Hospital Stay (HOSPITAL_BASED_OUTPATIENT_CLINIC_OR_DEPARTMENT_OTHER): Payer: BC Managed Care – PPO | Admitting: Hematology and Oncology

## 2019-05-26 ENCOUNTER — Inpatient Hospital Stay: Payer: BC Managed Care – PPO | Attending: Hematology and Oncology

## 2019-05-26 DIAGNOSIS — Z171 Estrogen receptor negative status [ER-]: Secondary | ICD-10-CM

## 2019-05-26 DIAGNOSIS — Z95828 Presence of other vascular implants and grafts: Secondary | ICD-10-CM

## 2019-05-26 DIAGNOSIS — C50411 Malignant neoplasm of upper-outer quadrant of right female breast: Secondary | ICD-10-CM

## 2019-05-26 DIAGNOSIS — Z5112 Encounter for antineoplastic immunotherapy: Secondary | ICD-10-CM | POA: Insufficient documentation

## 2019-05-26 DIAGNOSIS — Z79899 Other long term (current) drug therapy: Secondary | ICD-10-CM | POA: Diagnosis not present

## 2019-05-26 LAB — CMP (CANCER CENTER ONLY)
ALT: 15 U/L (ref 0–44)
AST: 15 U/L (ref 15–41)
Albumin: 4.1 g/dL (ref 3.5–5.0)
Alkaline Phosphatase: 67 U/L (ref 38–126)
Anion gap: 8 (ref 5–15)
BUN: 13 mg/dL (ref 6–20)
CO2: 25 mmol/L (ref 22–32)
Calcium: 8.9 mg/dL (ref 8.9–10.3)
Chloride: 109 mmol/L (ref 98–111)
Creatinine: 0.94 mg/dL (ref 0.44–1.00)
GFR, Est AFR Am: >60 mL/min (ref >60)
GFR, Estimated: >60 mL/min (ref >60)
Glucose, Bld: 93 mg/dL (ref 70–99)
Potassium: 4.4 mmol/L (ref 3.5–5.1)
Sodium: 142 mmol/L (ref 135–145)
Total Bilirubin: 0.3 mg/dL (ref 0.3–1.2)
Total Protein: 6.9 g/dL (ref 6.5–8.1)

## 2019-05-26 LAB — CBC WITH DIFFERENTIAL (CANCER CENTER ONLY)
Abs Immature Granulocytes: 0.01 10*3/uL (ref 0.00–0.07)
Basophils Absolute: 0 10*3/uL (ref 0.0–0.1)
Basophils Relative: 1 %
Eosinophils Absolute: 0.1 10*3/uL (ref 0.0–0.5)
Eosinophils Relative: 2 %
HCT: 38.8 % (ref 36.0–46.0)
Hemoglobin: 12.6 g/dL (ref 12.0–15.0)
Immature Granulocytes: 0 %
Lymphocytes Relative: 26 %
Lymphs Abs: 1.3 10*3/uL (ref 0.7–4.0)
MCH: 29.4 pg (ref 26.0–34.0)
MCHC: 32.5 g/dL (ref 30.0–36.0)
MCV: 90.4 fL (ref 80.0–100.0)
Monocytes Absolute: 0.3 10*3/uL (ref 0.1–1.0)
Monocytes Relative: 6 %
Neutro Abs: 3.2 10*3/uL (ref 1.7–7.7)
Neutrophils Relative %: 65 %
Platelet Count: 247 10*3/uL (ref 150–400)
RBC: 4.29 MIL/uL (ref 3.87–5.11)
RDW: 13.1 % (ref 11.5–15.5)
WBC Count: 4.9 10*3/uL (ref 4.0–10.5)
nRBC: 0 % (ref 0.0–0.2)

## 2019-05-26 MED ORDER — SODIUM CHLORIDE 0.9% FLUSH
10.0000 mL | Freq: Once | INTRAVENOUS | Status: AC
  Administered 2019-05-26: 10 mL
  Filled 2019-05-26: qty 10

## 2019-05-26 MED ORDER — ACETAMINOPHEN 325 MG PO TABS
650.0000 mg | ORAL_TABLET | Freq: Once | ORAL | Status: AC
  Administered 2019-05-26: 650 mg via ORAL

## 2019-05-26 MED ORDER — SODIUM CHLORIDE 0.9 % IV SOLN
Freq: Once | INTRAVENOUS | Status: AC
  Filled 2019-05-26: qty 250

## 2019-05-26 MED ORDER — TRASTUZUMAB-DKST CHEMO 150 MG IV SOLR
450.0000 mg | Freq: Once | INTRAVENOUS | Status: AC
  Administered 2019-05-26: 450 mg via INTRAVENOUS
  Filled 2019-05-26: qty 21.43

## 2019-05-26 MED ORDER — ACETAMINOPHEN 325 MG PO TABS
ORAL_TABLET | ORAL | Status: AC
  Filled 2019-05-26: qty 2

## 2019-05-26 MED ORDER — DIPHENHYDRAMINE HCL 25 MG PO CAPS
25.0000 mg | ORAL_CAPSULE | Freq: Once | ORAL | Status: AC
  Administered 2019-05-26: 25 mg via ORAL

## 2019-05-26 MED ORDER — HEPARIN SOD (PORK) LOCK FLUSH 100 UNIT/ML IV SOLN
500.0000 [IU] | Freq: Once | INTRAVENOUS | Status: AC | PRN
  Administered 2019-05-26: 500 [IU]
  Filled 2019-05-26: qty 5

## 2019-05-26 MED ORDER — DIPHENHYDRAMINE HCL 25 MG PO CAPS
ORAL_CAPSULE | ORAL | Status: AC
  Filled 2019-05-26: qty 1

## 2019-05-26 MED ORDER — SODIUM CHLORIDE 0.9% FLUSH
10.0000 mL | INTRAVENOUS | Status: DC | PRN
  Administered 2019-05-26: 10 mL
  Filled 2019-05-26: qty 10

## 2019-05-26 NOTE — Assessment & Plan Note (Signed)
10/11/2018:Patient palpated a right breast mass, mammogram revealed 1.6 cm mass, biopsy revealed grade 3 invasive ductal carcinoma ER 0%, PR 0%, Ki-67 20%, HER-2 3+ positive, T1CN0 stage IA  Treatment plan: 1. Neoadjuvant Taxol and Herceptin weekly x12. Followed by Herceptin maintenance therapy every 3 weeks to complete 1 year.10/29/2018-01/26/2019 2.Breast conserving surgery with sentinel lymph node biopsy10/20/2020:Bilateral mastectomies ([name]) Left breast: benign tissue and no evidence of malignancy Right breast: no residual carcinoma, 2 right axillary lymph nodes negative 3.Adjuvant radiation therapy Upbeat clinical trial: No adverse effects from participating in the trial Lynch syndrome positive on genetic testing (I discussed with her about aspirin 600 mg for prevention of colon cancer risk: Study published in Lancet) ---------------------------------------------------------------------------------------------------------------------------------------- Depression/anxiety/panic attacks:On Wellbutrin doing better Current treatment: Herceptin maintenance Chemo-induced peripheral neuropathy: Stable  Port will be taken out during her reconstruction surgery in January.  Subsequently she will receive subcutaneous Herceptin. Return to clinic every 3 weeks for Herceptin every 6 weeks for follow-up with me with labs 

## 2019-05-26 NOTE — Patient Instructions (Signed)
[facility] Discharge Instructions for Patients Receiving Chemotherapy  Today you received the following chemotherapy agents: Trastuzumab   To help prevent nausea and vomiting after your treatment, we encourage you to take your nausea medication  as prescribed.    If you develop nausea and vomiting that is not controlled by your nausea medication, call the clinic.   BELOW ARE SYMPTOMS THAT SHOULD BE REPORTED IMMEDIATELY:  *FEVER GREATER THAN 100.5 F  *CHILLS WITH OR WITHOUT FEVER  NAUSEA AND VOMITING THAT IS NOT CONTROLLED WITH YOUR NAUSEA MEDICATION  *UNUSUAL SHORTNESS OF BREATH  *UNUSUAL BRUISING OR BLEEDING  TENDERNESS IN MOUTH AND THROAT WITH OR WITHOUT PRESENCE OF ULCERS  *URINARY PROBLEMS  *BOWEL PROBLEMS  UNUSUAL RASH Items with * indicate a potential emergency and should be followed up as soon as possible.  Feel free to call the clinic should you have any questions or concerns. The clinic phone number is [phone].  Please show the CHEMO ALERT CARD at check-in to the Emergency Department and triage nurse.   

## 2019-05-27 ENCOUNTER — Other Ambulatory Visit (HOSPITAL_COMMUNITY)
Admission: RE | Admit: 2019-05-27 | Discharge: 2019-05-27 | Disposition: A | Discharge status: Home / Self Care | Payer: BC Managed Care – PPO | Source: Ambulatory Visit | Attending: Plastic Surgery | Admitting: Plastic Surgery

## 2019-05-27 DIAGNOSIS — Z01812 Encounter for preprocedural laboratory examination: Secondary | ICD-10-CM | POA: Diagnosis not present

## 2019-05-27 DIAGNOSIS — Z20822 Contact with and (suspected) exposure to covid-19: Secondary | ICD-10-CM | POA: Diagnosis not present

## 2019-05-27 LAB — SARS CORONAVIRUS 2 (TAT 6-24 HRS): SARS Coronavirus 2: NEGATIVE

## 2019-05-31 ENCOUNTER — Ambulatory Visit (HOSPITAL_BASED_OUTPATIENT_CLINIC_OR_DEPARTMENT_OTHER): Payer: BC Managed Care – PPO | Admitting: Anesthesiology

## 2019-05-31 ENCOUNTER — Encounter (HOSPITAL_BASED_OUTPATIENT_CLINIC_OR_DEPARTMENT_OTHER)
Admission: RE | Disposition: A | Discharge status: Home / Self Care | Payer: Self-pay | Source: Home / Self Care | Attending: Plastic Surgery

## 2019-05-31 ENCOUNTER — Ambulatory Visit (HOSPITAL_BASED_OUTPATIENT_CLINIC_OR_DEPARTMENT_OTHER)
Admission: RE | Admit: 2019-05-31 | Discharge: 2019-05-31 | Disposition: A | Discharge status: Home / Self Care | Payer: BC Managed Care – PPO | Attending: Plastic Surgery | Admitting: Plastic Surgery

## 2019-05-31 ENCOUNTER — Other Ambulatory Visit: Payer: Self-pay

## 2019-05-31 ENCOUNTER — Encounter (HOSPITAL_BASED_OUTPATIENT_CLINIC_OR_DEPARTMENT_OTHER): Payer: Self-pay | Admitting: Plastic Surgery

## 2019-05-31 DIAGNOSIS — Z9221 Personal history of antineoplastic chemotherapy: Secondary | ICD-10-CM | POA: Diagnosis not present

## 2019-05-31 DIAGNOSIS — Z853 Personal history of malignant neoplasm of breast: Secondary | ICD-10-CM | POA: Insufficient documentation

## 2019-05-31 DIAGNOSIS — Z9013 Acquired absence of bilateral breasts and nipples: Secondary | ICD-10-CM | POA: Diagnosis not present

## 2019-05-31 DIAGNOSIS — Z421 Encounter for breast reconstruction following mastectomy: Secondary | ICD-10-CM | POA: Diagnosis not present

## 2019-05-31 DIAGNOSIS — Z9011 Acquired absence of right breast and nipple: Secondary | ICD-10-CM | POA: Insufficient documentation

## 2019-05-31 DIAGNOSIS — Z1509 Genetic susceptibility to other malignant neoplasm: Secondary | ICD-10-CM | POA: Insufficient documentation

## 2019-05-31 HISTORY — PX: LIPOSUCTION WITH LIPOFILLING: SHX6436

## 2019-05-31 HISTORY — PX: PORT-A-CATH REMOVAL: SHX5289

## 2019-05-31 HISTORY — PX: REMOVAL OF BILATERAL TISSUE EXPANDERS WITH PLACEMENT OF BILATERAL BREAST IMPLANTS: SHX6431

## 2019-05-31 LAB — POCT PREGNANCY, URINE: Preg Test, Ur: NEGATIVE

## 2019-05-31 SURGERY — REMOVAL, TISSUE EXPANDER, BREAST, BILATERAL, WITH BILATERAL IMPLANT IMPLANT INSERTION
Anesthesia: General | Site: Chest | Laterality: Right

## 2019-05-31 MED ORDER — ONDANSETRON HCL 4 MG/2ML IJ SOLN
INTRAMUSCULAR | Status: DC | PRN
  Administered 2019-05-31: 4 mg via INTRAVENOUS

## 2019-05-31 MED ORDER — ACETAMINOPHEN 500 MG PO TABS
ORAL_TABLET | ORAL | Status: AC
  Filled 2019-05-31: qty 2

## 2019-05-31 MED ORDER — DEXAMETHASONE SODIUM PHOSPHATE 4 MG/ML IJ SOLN
INTRAMUSCULAR | Status: DC | PRN
  Administered 2019-05-31: 10 mg via INTRAVENOUS

## 2019-05-31 MED ORDER — SODIUM BICARBONATE 4 % IV SOLN
INTRAVENOUS | Status: DC | PRN
  Administered 2019-05-31: 200 mL via INTRAMUSCULAR

## 2019-05-31 MED ORDER — CELECOXIB 200 MG PO CAPS
200.0000 mg | ORAL_CAPSULE | ORAL | Status: AC
  Administered 2019-05-31: 200 mg via ORAL

## 2019-05-31 MED ORDER — OXYCODONE HCL 5 MG/5ML PO SOLN: 5.0000 mg | Freq: Once | ORAL | Status: DC | PRN

## 2019-05-31 MED ORDER — ACETAMINOPHEN 500 MG PO TABS
1000.0000 mg | ORAL_TABLET | ORAL | Status: AC
  Administered 2019-05-31: 1000 mg via ORAL

## 2019-05-31 MED ORDER — FENTANYL CITRATE (PF) 100 MCG/2ML IJ SOLN
INTRAMUSCULAR | Status: AC
  Filled 2019-05-31: qty 2

## 2019-05-31 MED ORDER — CHLORHEXIDINE GLUCONATE CLOTH 2 % EX PADS: 6.0000 | MEDICATED_PAD | Freq: Once | CUTANEOUS | Status: DC

## 2019-05-31 MED ORDER — BUPIVACAINE HCL (PF) 0.25 % IJ SOLN
INTRAMUSCULAR | Status: AC
  Filled 2019-05-31: qty 60

## 2019-05-31 MED ORDER — CEFAZOLIN SODIUM-DEXTROSE 2-4 GM/100ML-% IV SOLN: 2.0000 g | INTRAVENOUS | Status: DC

## 2019-05-31 MED ORDER — LIDOCAINE HCL (CARDIAC) PF 100 MG/5ML IV SOSY
PREFILLED_SYRINGE | INTRAVENOUS | Status: DC | PRN
  Administered 2019-05-31: 100 mg via INTRAVENOUS

## 2019-05-31 MED ORDER — FENTANYL CITRATE (PF) 100 MCG/2ML IJ SOLN
25.0000 ug | INTRAMUSCULAR | Status: DC | PRN
  Administered 2019-05-31: 50 ug via INTRAVENOUS

## 2019-05-31 MED ORDER — CELECOXIB 200 MG PO CAPS
ORAL_CAPSULE | ORAL | Status: AC
  Filled 2019-05-31: qty 1

## 2019-05-31 MED ORDER — OXYCODONE HCL 5 MG PO TABS: 5.0000 mg | ORAL_TABLET | Freq: Once | ORAL | Status: DC | PRN

## 2019-05-31 MED ORDER — ROCURONIUM BROMIDE 100 MG/10ML IV SOLN
INTRAVENOUS | Status: DC | PRN
  Administered 2019-05-31: 50 mg via INTRAVENOUS

## 2019-05-31 MED ORDER — PROMETHAZINE HCL 25 MG/ML IJ SOLN
INTRAMUSCULAR | Status: AC
  Filled 2019-05-31: qty 1

## 2019-05-31 MED ORDER — MIDAZOLAM HCL 2 MG/2ML IJ SOLN
INTRAMUSCULAR | Status: AC
  Filled 2019-05-31: qty 2

## 2019-05-31 MED ORDER — SCOPOLAMINE 1 MG/3DAYS TD PT72
1.0000 | MEDICATED_PATCH | TRANSDERMAL | Status: DC
  Administered 2019-05-31: 1.5 mg via TRANSDERMAL

## 2019-05-31 MED ORDER — EPINEPHRINE PF 1 MG/ML IJ SOLN
INTRAMUSCULAR | Status: AC
  Filled 2019-05-31: qty 1

## 2019-05-31 MED ORDER — POVIDONE-IODINE 10 % EX SOLN
CUTANEOUS | Status: DC | PRN
  Administered 2019-05-31: 1 via TOPICAL

## 2019-05-31 MED ORDER — MIDAZOLAM HCL 5 MG/5ML IJ SOLN
INTRAMUSCULAR | Status: DC | PRN
  Administered 2019-05-31: 2 mg via INTRAVENOUS

## 2019-05-31 MED ORDER — ROCURONIUM BROMIDE 10 MG/ML (PF) SYRINGE
PREFILLED_SYRINGE | INTRAVENOUS | Status: AC
  Filled 2019-05-31: qty 10

## 2019-05-31 MED ORDER — ONDANSETRON HCL 4 MG/2ML IJ SOLN
INTRAMUSCULAR | Status: AC
  Filled 2019-05-31: qty 2

## 2019-05-31 MED ORDER — CEFAZOLIN SODIUM-DEXTROSE 2-4 GM/100ML-% IV SOLN
INTRAVENOUS | Status: AC
  Filled 2019-05-31: qty 100

## 2019-05-31 MED ORDER — PROPOFOL 10 MG/ML IV BOLUS
INTRAVENOUS | Status: DC | PRN
  Administered 2019-05-31: 200 mg via INTRAVENOUS

## 2019-05-31 MED ORDER — SODIUM CHLORIDE 0.9 % IV SOLN
INTRAVENOUS | Status: DC | PRN
  Administered 2019-05-31: 09:00:00 1000 mL

## 2019-05-31 MED ORDER — GABAPENTIN 300 MG PO CAPS
ORAL_CAPSULE | ORAL | Status: AC
  Filled 2019-05-31: qty 1

## 2019-05-31 MED ORDER — SCOPOLAMINE 1 MG/3DAYS TD PT72
MEDICATED_PATCH | TRANSDERMAL | Status: AC
  Filled 2019-05-31: qty 1

## 2019-05-31 MED ORDER — SUGAMMADEX SODIUM 200 MG/2ML IV SOLN
INTRAVENOUS | Status: DC | PRN
  Administered 2019-05-31: 200 mg via INTRAVENOUS

## 2019-05-31 MED ORDER — LIDOCAINE HCL (PF) 1 % IJ SOLN
INTRAMUSCULAR | Status: AC
  Filled 2019-05-31: qty 60

## 2019-05-31 MED ORDER — SODIUM BICARBONATE 4.2 % IV SOLN
INTRAVENOUS | Status: AC
  Filled 2019-05-31: qty 10

## 2019-05-31 MED ORDER — LIDOCAINE 2% (20 MG/ML) 5 ML SYRINGE
INTRAMUSCULAR | Status: AC
  Filled 2019-05-31: qty 5

## 2019-05-31 MED ORDER — PROPOFOL 10 MG/ML IV BOLUS
INTRAVENOUS | Status: AC
  Filled 2019-05-31: qty 40

## 2019-05-31 MED ORDER — ONDANSETRON HCL 4 MG/2ML IJ SOLN
4.0000 mg | Freq: Four times a day (QID) | INTRAMUSCULAR | Status: AC | PRN
  Administered 2019-05-31: 4 mg via INTRAVENOUS

## 2019-05-31 MED ORDER — PROPOFOL 500 MG/50ML IV EMUL
INTRAVENOUS | Status: DC | PRN
  Administered 2019-05-31: 25 ug/kg/min via INTRAVENOUS

## 2019-05-31 MED ORDER — GABAPENTIN 300 MG PO CAPS
300.0000 mg | ORAL_CAPSULE | ORAL | Status: AC
  Administered 2019-05-31: 300 mg via ORAL

## 2019-05-31 MED ORDER — LACTATED RINGERS IV SOLN: INTRAVENOUS | Status: DC

## 2019-05-31 MED ORDER — DEXAMETHASONE SODIUM PHOSPHATE 10 MG/ML IJ SOLN
INTRAMUSCULAR | Status: AC
  Filled 2019-05-31: qty 1

## 2019-05-31 MED ORDER — FENTANYL CITRATE (PF) 100 MCG/2ML IJ SOLN
INTRAMUSCULAR | Status: DC | PRN
  Administered 2019-05-31: 100 ug via INTRAVENOUS
  Administered 2019-05-31 (×2): 50 ug via INTRAVENOUS

## 2019-05-31 MED ORDER — PROMETHAZINE HCL 25 MG/ML IJ SOLN
6.2500 mg | Freq: Four times a day (QID) | INTRAMUSCULAR | Status: DC | PRN
  Administered 2019-05-31: 6.25 mg via INTRAVENOUS

## 2019-05-31 SURGICAL SUPPLY — 105 items
ADH SKN CLS APL DERMABOND .7 (GAUZE/BANDAGES/DRESSINGS) ×4
APL PRP STRL LF DISP 70% ISPRP (MISCELLANEOUS) ×6
APL SKNCLS STERI-STRIP NONHPOA (GAUZE/BANDAGES/DRESSINGS)
BAG DECANTER FOR FLEXI CONT (MISCELLANEOUS) ×3 IMPLANT
BENZOIN TINCTURE PRP APPL 2/3 (GAUZE/BANDAGES/DRESSINGS) IMPLANT
BINDER ABDOMINAL 10 UNV 27-48 (MISCELLANEOUS) IMPLANT
BINDER ABDOMINAL 12 SM 30-45 (SOFTGOODS) IMPLANT
BINDER BREAST 3XL (GAUZE/BANDAGES/DRESSINGS) IMPLANT
BINDER BREAST LRG (GAUZE/BANDAGES/DRESSINGS) ×1 IMPLANT
BINDER BREAST MEDIUM (GAUZE/BANDAGES/DRESSINGS) IMPLANT
BINDER BREAST XLRG (GAUZE/BANDAGES/DRESSINGS) IMPLANT
BINDER BREAST XXLRG (GAUZE/BANDAGES/DRESSINGS) IMPLANT
BLADE CLIPPER SURG (BLADE) IMPLANT
BLADE SURG 10 STRL SS (BLADE) ×6 IMPLANT
BLADE SURG 11 STRL SS (BLADE) ×3 IMPLANT
BLADE SURG 15 STRL LF DISP TIS (BLADE) ×2 IMPLANT
BLADE SURG 15 STRL SS (BLADE)
BNDG GAUZE ELAST 4 BULKY (GAUZE/BANDAGES/DRESSINGS) ×6 IMPLANT
CANISTER LIPO FAT HARVEST (MISCELLANEOUS) ×1 IMPLANT
CANISTER SUCT 1200ML W/VALVE (MISCELLANEOUS) ×4 IMPLANT
CHLORAPREP W/TINT 26 (MISCELLANEOUS) ×5 IMPLANT
COVER BACK TABLE 60X90IN (DRAPES) ×3 IMPLANT
COVER MAYO STAND STRL (DRAPES) ×6 IMPLANT
COVER WAND RF STERILE (DRAPES) IMPLANT
DECANTER SPIKE VIAL GLASS SM (MISCELLANEOUS) IMPLANT
DERMABOND ADVANCED (GAUZE/BANDAGES/DRESSINGS) ×2
DERMABOND ADVANCED .7 DNX12 (GAUZE/BANDAGES/DRESSINGS) ×4 IMPLANT
DRAIN CHANNEL 15F RND FF W/TCR (WOUND CARE) IMPLANT
DRAPE LAPAROTOMY 100X72 PEDS (DRAPES) IMPLANT
DRAPE TOP ARMCOVERS (MISCELLANEOUS) ×3 IMPLANT
DRAPE U-SHAPE 76X120 STRL (DRAPES) ×3 IMPLANT
DRAPE UTILITY XL STRL (DRAPES) ×4 IMPLANT
DRSG PAD ABDOMINAL 8X10 ST (GAUZE/BANDAGES/DRESSINGS) ×8 IMPLANT
ELECT BLADE 4.0 EZ CLEAN MEGAD (MISCELLANEOUS)
ELECT COATED BLADE 2.86 ST (ELECTRODE) ×3 IMPLANT
ELECT REM PT RETURN 9FT ADLT (ELECTROSURGICAL) ×3
ELECTRODE BLDE 4.0 EZ CLN MEGD (MISCELLANEOUS) ×2 IMPLANT
ELECTRODE REM PT RTRN 9FT ADLT (ELECTROSURGICAL) ×2 IMPLANT
EVACUATOR SILICONE 100CC (DRAIN) IMPLANT
EXTRACTOR CANIST REVOLVE STRL (CANNISTER) IMPLANT
GAUZE SPONGE 4X4 12PLY STRL LF (GAUZE/BANDAGES/DRESSINGS) IMPLANT
GLOVE BIO SURGEON STRL SZ 6 (GLOVE) ×6 IMPLANT
GOWN STRL REUS W/ TWL LRG LVL3 (GOWN DISPOSABLE) ×4 IMPLANT
GOWN STRL REUS W/TWL LRG LVL3 (GOWN DISPOSABLE) ×6
IMPL BREAST P5.9: XFULL 420 (Breast) IMPLANT
IMPL BRST P5.9: XFULL 420CC (Breast) ×4 IMPLANT
IMPLANT BREAST GEL 420CC (Breast) ×6 IMPLANT
IV NS 500ML (IV SOLUTION)
IV NS 500ML BAXH (IV SOLUTION) IMPLANT
KIT FILL SYSTEM UNIVERSAL (SET/KITS/TRAYS/PACK) IMPLANT
LINER CANISTER 1000CC FLEX (MISCELLANEOUS) ×3 IMPLANT
MARKER SKIN DUAL TIP RULER LAB (MISCELLANEOUS) IMPLANT
NDL FILTER BLUNT 18X1 1/2 (NEEDLE) IMPLANT
NDL HYPO 25X1 1.5 SAFETY (NEEDLE) IMPLANT
NDL HYPO 30GX1 BEV (NEEDLE) IMPLANT
NDL PRECISIONGLIDE 27X1.5 (NEEDLE) ×2 IMPLANT
NDL SAFETY ECLIPSE 18X1.5 (NEEDLE) ×2 IMPLANT
NEEDLE FILTER BLUNT 18X 1/2SAF (NEEDLE) ×1
NEEDLE FILTER BLUNT 18X1 1/2 (NEEDLE) ×2 IMPLANT
NEEDLE HYPO 18GX1.5 SHARP (NEEDLE) ×3
NEEDLE HYPO 25X1 1.5 SAFETY (NEEDLE) IMPLANT
NEEDLE HYPO 30GX1 BEV (NEEDLE) IMPLANT
NEEDLE PRECISIONGLIDE 27X1.5 (NEEDLE) IMPLANT
NS IRRIG 1000ML POUR BTL (IV SOLUTION) ×2 IMPLANT
PACK BASIN DAY SURGERY FS (CUSTOM PROCEDURE TRAY) ×3 IMPLANT
PAD ALCOHOL SWAB (MISCELLANEOUS) ×3 IMPLANT
PENCIL SMOKE EVACUATOR (MISCELLANEOUS) ×3 IMPLANT
PIN SAFETY STERILE (MISCELLANEOUS) IMPLANT
PUNCH BIOPSY DERMAL 4MM (MISCELLANEOUS) IMPLANT
SHEET MEDIUM DRAPE 40X70 STRL (DRAPES) ×6 IMPLANT
SIZER BREAST REUSE 365CC (SIZER) ×3
SIZER BREAST REUSE 400CC (SIZER) ×3
SIZER BREAST REUSE 420CC (SIZER) ×3
SIZER BRST REUSE 365CC (SIZER) IMPLANT
SIZER BRST REUSE 420CC (SIZER) IMPLANT
SIZER BRST REUSE P5.8X 400CC (SIZER) IMPLANT
SLEEVE SCD COMPRESS KNEE MED (MISCELLANEOUS) ×3 IMPLANT
SPONGE GAUZE 2X2 8PLY STRL LF (GAUZE/BANDAGES/DRESSINGS) IMPLANT
SPONGE LAP 18X18 RF (DISPOSABLE) ×6 IMPLANT
STAPLER VISISTAT 35W (STAPLE) ×3 IMPLANT
STRIP CLOSURE SKIN 1/2X4 (GAUZE/BANDAGES/DRESSINGS) IMPLANT
SUT ETHILON 2 0 FS 18 (SUTURE) IMPLANT
SUT MNCRL AB 4-0 PS2 18 (SUTURE) ×6 IMPLANT
SUT PDS AB 2-0 CT2 27 (SUTURE) IMPLANT
SUT VIC AB 3-0 PS1 18 (SUTURE)
SUT VIC AB 3-0 PS1 18XBRD (SUTURE) IMPLANT
SUT VIC AB 3-0 SH 27 (SUTURE) ×6
SUT VIC AB 3-0 SH 27X BRD (SUTURE) ×4 IMPLANT
SUT VICRYL 4-0 PS2 18IN ABS (SUTURE) ×6 IMPLANT
SYR 10ML LL (SYRINGE) ×10 IMPLANT
SYR 20ML LL LF (SYRINGE) IMPLANT
SYR 50ML LL SCALE MARK (SYRINGE) ×7 IMPLANT
SYR BULB 3OZ (MISCELLANEOUS) IMPLANT
SYR BULB IRRIGATION 50ML (SYRINGE) ×6 IMPLANT
SYR CONTROL 10ML LL (SYRINGE) ×7 IMPLANT
SYR TB 1ML LL NO SAFETY (SYRINGE) ×2 IMPLANT
SYR TOOMEY IRRIG 70ML (MISCELLANEOUS)
SYRINGE TOOMEY IRRIG 70ML (MISCELLANEOUS) IMPLANT
TOWEL GREEN STERILE FF (TOWEL DISPOSABLE) ×5 IMPLANT
TRAY DSU PREP LF (CUSTOM PROCEDURE TRAY) IMPLANT
TUBE CONNECTING 20X1/4 (TUBING) ×4 IMPLANT
TUBING INFILTRATION IT-10001 (TUBING) ×3 IMPLANT
TUBING SET GRADUATE ASPIR 12FT (MISCELLANEOUS) ×3 IMPLANT
UNDERPAD 30X36 HEAVY ABSORB (UNDERPADS AND DIAPERS) ×6 IMPLANT
YANKAUER SUCT BULB TIP NO VENT (SUCTIONS) ×3 IMPLANT

## 2019-05-31 NOTE — OR Nursing (Signed)
Right chest portacath removed by Dr Iran Planas.

## 2019-05-31 NOTE — Anesthesia Postprocedure Evaluation (Signed)
Anesthesia Post Note  Patient: [name]  Procedure(s) Performed: REMOVAL OF BILATERAL TISSUE EXPANDERS WITH PLACEMENT OF SILICONE BILATERAL BREAST IMPLANTS (Bilateral Breast) LIPOFILLING FROM ABDOMEN TO BILATERAL CHEST (Bilateral Breast) REMOVAL PORT-A-CATH (Right Chest)     Patient location during evaluation: PACU Anesthesia Type: General Level of consciousness: awake and alert Pain management: pain level controlled Vital Signs Assessment: post-procedure vital signs reviewed and stable Respiratory status: spontaneous breathing, nonlabored ventilation, respiratory function stable and patient connected to nasal cannula oxygen Cardiovascular status: blood pressure returned to baseline and stable Postop Assessment: no apparent nausea or vomiting Anesthetic complications: no    Last Vitals:  Vitals:   05/31/19 1115 05/31/19 1130  BP: 134/84 135/89  Pulse: 82 71  Resp: 17 18  Temp:    SpO2: 94% 97%    Last Pain:  Vitals:   05/31/19 1115  TempSrc:   PainSc: [place]

## 2019-05-31 NOTE — Interval H&P Note (Signed)
History and Physical Interval Note:  05/31/2019 6:59 AM  Drue Dun  has presented today for surgery, with the diagnosis of right breast cancer UOQ ER negative, acquired absence breasts, lynch syndrome.  The various methods of treatment have been discussed with the patient and family. After consideration of risks, benefits and other options for treatment, the patient has consented to  Procedure(s): REMOVAL OF BILATERAL TISSUE EXPANDERS WITH PLACEMENT OF SILICONE BILATERAL BREAST IMPLANTS (Bilateral) LIPOFILLING FROM ABDOMEN TO BILATERAL CHEST (Bilateral) REMOVAL PORT-A-CATH (N/A) as a surgical intervention.  The patient's history has been reviewed, patient examined, no change in status, stable for surgery.  I have reviewed the patient's chart and labs.  Questions were answered to the patient's satisfaction.     Arnoldo Hooker Deanna Boehlke

## 2019-05-31 NOTE — Anesthesia Preprocedure Evaluation (Signed)
Anesthesia Evaluation  Patient identified by MRN, date of birth, ID band Patient awake    Reviewed: Allergy & Precautions, H&P , NPO status , Patient's Chart, lab work & pertinent test results  Airway Mallampati: II   Neck ROM: full    Dental   Pulmonary neg pulmonary ROS,    breath sounds clear to auscultation       Cardiovascular negative cardio ROS   Rhythm:regular Rate:Normal     Neuro/Psych PSYCHIATRIC DISORDERS Anxiety  Neuromuscular disease    GI/Hepatic   Endo/Other    Renal/GU      Musculoskeletal  (+) Arthritis ,   Abdominal   Peds  Hematology   Anesthesia Other Findings   Reproductive/Obstetrics                             Anesthesia Physical Anesthesia Plan  ASA: II  Anesthesia Plan: General   Post-op Pain Management:    Induction: Intravenous  PONV Risk Score and Plan: 3 and Ondansetron, Dexamethasone, Midazolam and Treatment may vary due to age or medical condition  Airway Management Planned: Oral ETT  Additional Equipment:   Intra-op Plan:   Post-operative Plan: Extubation in OR  Informed Consent: I have reviewed the patients History and Physical, chart, labs and discussed the procedure including the risks, benefits and alternatives for the proposed anesthesia with the patient or authorized representative who has indicated his/her understanding and acceptance.       Plan Discussed with: CRNA, Anesthesiologist and Surgeon  Anesthesia Plan Comments:         Anesthesia Quick Evaluation

## 2019-05-31 NOTE — Discharge Instructions (Signed)
  Post Anesthesia Home Care Instructions  Activity: Get plenty of rest for the remainder of the day. A responsible individual must stay with you for 24 hours following the procedure.  For the next 24 hours, DO NOT: -Drive a car -Paediatric nurse -Drink alcoholic beverages -Take any medication unless instructed by your physician -Make any legal decisions or sign important papers.  Meals: Start with liquid foods such as gelatin or soup. Progress to regular foods as tolerated. Avoid greasy, spicy, heavy foods. If nausea and/or vomiting occur, drink only clear liquids until the nausea and/or vomiting subsides. Call your physician if vomiting continues.  Special Instructions/Symptoms: Your throat may feel dry or sore from the anesthesia or the breathing tube placed in your throat during surgery. If this causes discomfort, gargle with warm salt water. The discomfort should disappear within 24 hours.  If you had a scopolamine patch placed behind your ear for the management of post- operative nausea and/or vomiting:  1. The medication in the patch is effective for 72 hours, after which it should be removed.  Wrap patch in a tissue and discard in the trash. Wash hands thoroughly with soap and water. 2. You may remove the patch earlier than 72 hours if you experience unpleasant side effects which may include dry mouth, dizziness or visual disturbances. 3. Avoid touching the patch. Wash your hands with soap and water after contact with the patch.    No tylenol today until after 1pm.  No ibuprofen today until after 3pm.

## 2019-05-31 NOTE — Transfer of Care (Signed)
Immediate Anesthesia Transfer of Care Note  Patient: [name]  Procedure(s) Performed: REMOVAL OF BILATERAL TISSUE EXPANDERS WITH PLACEMENT OF SILICONE BILATERAL BREAST IMPLANTS (Bilateral Breast) LIPOFILLING FROM ABDOMEN TO BILATERAL CHEST (Bilateral Breast) REMOVAL PORT-A-CATH (Right Chest)  Patient Location: PACU  Anesthesia Type:General  Level of Consciousness: awake, alert  and oriented  Airway & Oxygen Therapy: Patient Spontanous Breathing and Patient connected to face mask oxygen  Post-op Assessment: Report given to RN and Post -op Vital signs reviewed and stable  Post vital signs: Reviewed and stable  Last Vitals:  Vitals Value Taken Time  BP    Temp    Pulse    Resp    SpO2      Last Pain:  Vitals:   05/31/19 0711  TempSrc: Tympanic  PainSc: 0-No pain         Complications: No apparent anesthesia complications

## 2019-05-31 NOTE — Op Note (Signed)
Operative Note   DATE OF OPERATION: 1.26.2021  LOCATION: [facility]-outpatient  SURGICAL DIVISION: Plastic Surgery  PREOPERATIVE DIAGNOSES:  1. History breast cancer 2. Lynch syndrome 3. Acquired absence breasts  POSTOPERATIVE DIAGNOSES:  same  PROCEDURE:  1. Removal bilateral chest tissue expanders 2. Removal right chest port 3. Lipofilling to bilateral chest total 110 ml  SURGEON: Irene Limbo MD MBA  ASSISTANT: none  ANESTHESIA:  General.   EBL: 80 ml  COMPLICATIONS: None immediate.   INDICATIONS FOR PROCEDURE:  The patient, [name], is a 39 y.o. female born on May 01, 1981, is here for staged breast reconstruction following bilateral nipple sparing mastectomies with prepectoral expander based reconstruction.    FINDINGS: Complete incorporation ADM noted bilateral. Natrelle Inspira Smooth Round Extra Projection 420 ml implants placed bilateral. REF [id number] RIGHT SN [id number] LEFT SN [id number]. 50 ml fat infiltrated over right chest, 60 ml fat infiltrated over left chest.  DESCRIPTION OF PROCEDURE:  The patient's operative site was marked with the patient in the preoperative area including bilateral lateral hips to be used as donor site for lipofilling. The patient was taken to the operating room. SCDs were placed and IV antibiotics were given. The patient's operative site was prepped and draped in a sterile fashion. A time out was performed and all information was confirmed to be correct. Incision made over right chest port scar and carried through superficial fascia and capsule. Port removed intact. Closure completed with 4-0 vicryl in superficial fascia, 4-0 vicryl in dermis and 4-0 monocryl subcuticular skin closure.   Incision then made in right inframammary fold scar and carried through superficial fascia and acellular dermis. Expander removed. Limited scoring ADM completed lateral and caudal to NAC over anterior mastectomy flap. Sizer placed. Incision then made  in left inframammary fold scar and carried through superficial fascia and acellular dermis. Expander removed. Capsulotomy performed medially. Sizer placed. Patient brought to upright sitting position and Extra Projection 420 ml implants selected for bilateral placement. Patient returned to supine position.  Stab incision made over bilateral hips and tumescent infiltrated total 200 ml. Power assisted liposuction completed to endpoint symmetric soft tissue thickness, total lipoaspirate 200 ml. Fat then washed and prepared by gravity. Fat infiltrated in subcutaneous place over bilateral mastectomy flaps and right axilla.   Cavities irrigated with bacitracin, Ancef, gentamicinsolution.Hemostasis ensured. Cavities then irrigated with Betadinesaline solution. The implant was placed inrightchest andimplant orientation ensured. Closure completed with 3-0 vicryl to closesuperficial fascia and ADMover implant.4-0 vicryl used to close dermis followed by 4-0 monocryl subcuticular. Implant placed inleftchest andimplant orientation ensured. Closure completed with 3-0 vicryl to closesuperficial fascia and ADMover implant.4-0 vicryl used to close dermis followed by 4-0 monocryl subcuticularHip incisions approximated with simple 4-0 monocryl stitch.Tissue adhesiveapplied to chest incisions. Dry dressing applied, followed by breastbinderand compression garment hips.  The patient was allowed to wake from anesthesia, extubated and taken to the recovery room in satisfactory condition.   SPECIMENS: none  DRAINS: none

## 2019-05-31 NOTE — Anesthesia Procedure Notes (Signed)
Procedure Name: Intubation Performed by: Verita Lamb, CRNA Pre-anesthesia Checklist: Patient identified, Emergency Drugs available, Suction available, Patient being monitored and Timeout performed Patient Re-evaluated:Patient Re-evaluated prior to induction Oxygen Delivery Method: Circle system utilized Preoxygenation: Pre-oxygenation with 100% oxygen Induction Type: IV induction Ventilation: Mask ventilation without difficulty Laryngoscope Size: Mac and 3 Grade View: Grade I Tube type: Oral Number of attempts: 1 Airway Equipment and Method: Stylet and Oral airway Placement Confirmation: ETT inserted through vocal cords under direct vision,  positive ETCO2,  breath sounds checked- equal and bilateral and CO2 detector Secured at: 18 cm Tube secured with: Tape Dental Injury: Teeth and Oropharynx as per pre-operative assessment

## 2019-06-01 ENCOUNTER — Encounter: Payer: Self-pay | Admitting: *Deleted

## 2019-06-16 ENCOUNTER — Inpatient Hospital Stay: Payer: BC Managed Care – PPO | Attending: Hematology and Oncology

## 2019-06-16 ENCOUNTER — Other Ambulatory Visit: Payer: Self-pay

## 2019-06-16 VITALS — BP 142/93 | HR 82 | Temp 98.2°F | Resp 18

## 2019-06-16 DIAGNOSIS — Z171 Estrogen receptor negative status [ER-]: Secondary | ICD-10-CM | POA: Insufficient documentation

## 2019-06-16 DIAGNOSIS — Z5112 Encounter for antineoplastic immunotherapy: Secondary | ICD-10-CM | POA: Diagnosis not present

## 2019-06-16 DIAGNOSIS — C50411 Malignant neoplasm of upper-outer quadrant of right female breast: Secondary | ICD-10-CM | POA: Diagnosis not present

## 2019-06-16 DIAGNOSIS — Z79899 Other long term (current) drug therapy: Secondary | ICD-10-CM | POA: Insufficient documentation

## 2019-06-16 MED ORDER — ACETAMINOPHEN 325 MG PO TABS
ORAL_TABLET | ORAL | Status: AC
  Filled 2019-06-16: qty 2

## 2019-06-16 MED ORDER — SODIUM CHLORIDE 0.9 % IV SOLN
Freq: Once | INTRAVENOUS | Status: DC
  Filled 2019-06-16: qty 250

## 2019-06-16 MED ORDER — TRASTUZUMAB-HYALURONIDASE-OYSK 600-10000 MG-UNT/5ML ~~LOC~~ SOLN
600.0000 mg | Freq: Once | SUBCUTANEOUS | Status: AC
  Administered 2019-06-16: 600 mg via SUBCUTANEOUS
  Filled 2019-06-16: qty 5

## 2019-06-16 MED ORDER — DIPHENHYDRAMINE HCL 25 MG PO CAPS
25.0000 mg | ORAL_CAPSULE | Freq: Once | ORAL | Status: AC
  Administered 2019-06-16: 25 mg via ORAL

## 2019-06-16 MED ORDER — DIPHENHYDRAMINE HCL 25 MG PO CAPS
ORAL_CAPSULE | ORAL | Status: AC
  Filled 2019-06-16: qty 1

## 2019-06-16 MED ORDER — ACETAMINOPHEN 325 MG PO TABS
650.0000 mg | ORAL_TABLET | Freq: Once | ORAL | Status: AC
  Administered 2019-06-16: 650 mg via ORAL

## 2019-06-16 NOTE — Patient Instructions (Addendum)
[facility] Discharge Instructions for Patients Receiving Chemotherapy  Today you received the following chemotherapy agents: Herceptin Hylecta  To help prevent nausea and vomiting after your treatment, we encourage you to take your nausea medication as directed.   If you develop nausea and vomiting that is not controlled by your nausea medication, call the clinic.   BELOW ARE SYMPTOMS THAT SHOULD BE REPORTED IMMEDIATELY:  *FEVER GREATER THAN 100.5 F  *CHILLS WITH OR WITHOUT FEVER  NAUSEA AND VOMITING THAT IS NOT CONTROLLED WITH YOUR NAUSEA MEDICATION  *UNUSUAL SHORTNESS OF BREATH  *UNUSUAL BRUISING OR BLEEDING  TENDERNESS IN MOUTH AND THROAT WITH OR WITHOUT PRESENCE OF ULCERS  *URINARY PROBLEMS  *BOWEL PROBLEMS  UNUSUAL RASH Items with * indicate a potential emergency and should be followed up as soon as possible.  Feel free to call the clinic should you have any questions or concerns. The clinic phone number is [phone].  Please show the [place] at check-in to the Emergency Department and triage nurse.  Trastuzumab; Hyaluronidase injection What is this medicine? TRASTUZUMAB; HYALURONIDASE (tras TOO zoo mab / hye al ur ON i dase) is used to treat breast cancer and stomach cancer. Trastuzumab is a monoclonal antibody. Hyaluronidase is used to improve the effects of trastuzumab. This medicine may be used for other purposes; ask your health care provider or pharmacist if you have questions. COMMON BRAND NAME(S): HERCEPTIN HYLECTA What should I tell my health care provider before I take this medicine? They need to know if you have any of these conditions:  heart disease  heart failure  lung or breathing disease, like asthma  an unusual or allergic reaction to trastuzumab, or other medications, foods, dyes, or preservatives  pregnant or trying to get pregnant  breast-feeding How should I use this medicine? This medicine is for injection  under the skin. It is given by a health care professional in a hospital or clinic setting. Talk to your pediatrician regarding the use of this medicine in children. This medicine is not approved for use in children. Overdosage: If you think you have taken too much of this medicine contact a poison control center or emergency room at once. NOTE: This medicine is only for you. Do not share this medicine with others. What if I miss a dose? It is important not to miss a dose. Call your doctor or health care professional if you are unable to keep an appointment. What may interact with this medicine? This medicine may interact with the following medications:  certain types of chemotherapy, such as daunorubicin, doxorubicin, epirubicin, and idarubicin This list may not describe all possible interactions. Give your health care provider a list of all the medicines, herbs, non-prescription drugs, or dietary supplements you use. Also tell them if you smoke, drink alcohol, or use illegal drugs. Some items may interact with your medicine. What should I watch for while using this medicine? Visit your doctor for checks on your progress. Report any side effects. Continue your course of treatment even though you feel ill unless your doctor tells you to stop. Call your doctor or health care professional for advice if you get a fever, chills or sore throat, or other symptoms of a cold or flu. Do not treat yourself. Try to avoid being around people who are sick. You may experience fever, chills and shaking during your first infusion. These effects are usually mild and can be treated with other medicines. Report any side effects during the infusion  to your health care professional. Fever and chills usually do not happen with later infusions. Do not become pregnant while taking this medicine or for 7 months after stopping it. Women should inform their doctor if they wish to become pregnant or think they might be pregnant.  Women of child-bearing potential will need to have a negative pregnancy test before starting this medicine. There is a potential for serious side effects to an unborn child. Talk to your health care professional or pharmacist for more information. Do not breast-feed an infant while taking this medicine or for 7 months after stopping it. What side effects may I notice from receiving this medicine? Side effects that you should report to your doctor or health care professional as soon as possible:  allergic reactions like skin rash, itching or hives, swelling of the face, lips, or tongue  breathing problems  chest pain or palpitations  cough  fever  general ill feeling or flu-like symptoms  signs of worsening heart failure like breathing problems; swelling in your legs and feet Side effects that usually do not require medical attention (report these to your doctor or health care professional if they continue or are bothersome):  bone pain  changes in taste  diarrhea  joint pain  nausea/vomiting  unusually weak or tired  weight loss This list may not describe all possible side effects. Call your doctor for medical advice about side effects. You may report side effects to FDA at 1-800-FDA-1088. Where should I keep my medicine? This drug is given in a hospital or clinic and will not be stored at home. NOTE: This sheet is a summary. It may not cover all possible information. If you have questions about this medicine, talk to your doctor, pharmacist, or health care provider.  2020 Elsevier/Gold Standard (2017-07-10 21:54:17)

## 2019-06-17 ENCOUNTER — Telehealth: Payer: Self-pay | Admitting: *Deleted

## 2019-06-17 NOTE — Telephone Encounter (Signed)
Called & talked with pt regarding change in route of immunotherapy.  She received SQ herceptin hylecta.  She reports that she did OK except for some bleeding at injection site after needle removed from thigh.  Discussed possibility of hitting a blood vessel or just needing to hold pressure at site when needle removed.  She states she has a small bruise but doesn't hurt & is very willing to try again.  No questions & expressed appreciation of call.

## 2019-06-17 NOTE — Telephone Encounter (Signed)
-----   Message from Wylene Men, RN sent at 06/16/2019 11:13 AM EST ----- Regarding: [name] Patient received 1st dose of Herceptin Hylecta. Tolerated well.

## 2019-06-24 DIAGNOSIS — Z20828 Contact with and (suspected) exposure to other viral communicable diseases: Secondary | ICD-10-CM | POA: Diagnosis not present

## 2019-06-24 DIAGNOSIS — Z03818 Encounter for observation for suspected exposure to other biological agents ruled out: Secondary | ICD-10-CM | POA: Diagnosis not present

## 2019-06-30 ENCOUNTER — Other Ambulatory Visit: Payer: Self-pay

## 2019-06-30 ENCOUNTER — Ambulatory Visit (HOSPITAL_COMMUNITY)
Admission: RE | Admit: 2019-06-30 | Discharge: 2019-06-30 | Disposition: A | Discharge status: Home / Self Care | Payer: BC Managed Care – PPO | Source: Ambulatory Visit | Attending: Hematology and Oncology | Admitting: Hematology and Oncology

## 2019-06-30 DIAGNOSIS — C50411 Malignant neoplasm of upper-outer quadrant of right female breast: Secondary | ICD-10-CM | POA: Insufficient documentation

## 2019-06-30 DIAGNOSIS — I351 Nonrheumatic aortic (valve) insufficiency: Secondary | ICD-10-CM | POA: Diagnosis not present

## 2019-06-30 DIAGNOSIS — Z01818 Encounter for other preprocedural examination: Secondary | ICD-10-CM | POA: Insufficient documentation

## 2019-06-30 DIAGNOSIS — Z171 Estrogen receptor negative status [ER-]: Secondary | ICD-10-CM | POA: Diagnosis not present

## 2019-06-30 NOTE — Progress Notes (Signed)
  Echocardiogram 2D Echocardiogram has been performed.  [name] 06/30/2019, 9:57 AM

## 2019-07-04 ENCOUNTER — Encounter: Payer: Self-pay | Admitting: Hematology and Oncology

## 2019-07-06 ENCOUNTER — Encounter: Payer: Self-pay | Admitting: *Deleted

## 2019-07-06 NOTE — Progress Notes (Signed)
Patient Care Team: [name], [place] as PCP - General (Nurse Practitioner)  DIAGNOSIS:    ICD-10-CM   1. Malignant neoplasm of upper-outer quadrant of right breast in female, estrogen receptor negative ([place])  C50.411    Z17.1     SUMMARY OF ONCOLOGIC HISTORY: Oncology History  Malignant neoplasm of upper-outer quadrant of right breast in female, estrogen receptor negative ([place])  10/11/2018 Initial Diagnosis   Patient palpated a right breast mass, mammogram revealed 1.6 cm mass, biopsy revealed grade 3 invasive ductal carcinoma ER 0%, PR 0%, Ki-67 20%, HER-2 3+ positive, T1CN0 stage IA   10/19/2018 Cancer Staging   Staging form: Breast, AJCC 8th Edition - Clinical stage from 10/19/2018: Stage IA (cT1c, cN0, cM0, G3, ER-, PR-, HER2+) - Signed by [name], MD on 10/19/2018   10/29/2018 - 01/26/2019 Chemotherapy   The patient had trastuzumab (HERCEPTIN) 300 mg in sodium chloride 0.9 % 250 mL chemo infusion, 294 mg, Intravenous,  Once, 2 of 2 cycles Administration: 300 mg (10/29/2018), 150 mg (11/03/2018), 150 mg (12/02/2018), 150 mg (11/12/2018), 150 mg (11/25/2018), 150 mg (12/09/2018) PACLitaxel (TAXOL) 150 mg in sodium chloride 0.9 % 250 mL chemo infusion (</= 46m/m2), 80 mg/m2 = 150 mg, Intravenous,  Once, 3 of 3 cycles Dose modification: 65 mg/m2 (original dose 80 mg/m2, Cycle 3, Reason: Dose not tolerated) Administration: 150 mg (10/29/2018), 150 mg (11/03/2018), 150 mg (12/02/2018), 150 mg (11/12/2018), 150 mg (11/25/2018), 150 mg (12/09/2018), 150 mg (12/16/2018), 150 mg (12/23/2018), 120 mg (12/30/2018), 120 mg (01/06/2019), 120 mg (01/13/2019), 120 mg (01/20/2019) trastuzumab-dkst (OGIVRI) 150 mg in sodium chloride 0.9 % 250 mL chemo infusion, 150 mg (100 % of original dose 150 mg), Intravenous,  Once, 2 of 2 cycles Dose modification: 150 mg (original dose 150 mg, Cycle 2, Reason: Other (see comments), Comment: Biosimilar Conversion), 450 mg (original dose 150 mg, Cycle 3, Reason: Other (see comments),  Comment: Biosimilar Conversion) Administration: 150 mg (12/16/2018), 150 mg (12/23/2018), 150 mg (12/30/2018), 150 mg (01/06/2019), 150 mg (01/13/2019), 450 mg (01/20/2019)  for chemotherapy treatment.    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  The Common Hereditary Gene Panel offered by [facility] includes sequencing and/or deletion duplication testing of the following 48 genes: APC, ATM, AXIN2, BARD1, BMPR1A, BRCA1, BRCA2, BRIP1, CDH1, CDK4, CDKN2A (p14ARF), CDKN2A (p16INK4a), CHEK2, CTNNA1, DICER1, EPCAM (Deletion/duplication testing only), GREM1 (promoter region deletion/duplication testing only), KIT, MEN1, MLH1, MSH2, MSH3, MSH6, MUTYH, NBN, NF1, NHTL1, PALB2, PDGFRA, PMS2, POLD1, POLE, PTEN, RAD50, RAD51C, RAD51D, RNF43, SDHB, SDHC, SDHD, SMAD4, SMARCA4. STK11, TP53, TSC1, TSC2, and VHL.  The following genes were evaluated for sequence changes only: SDHA and HOXB13 c.251G>A variant only.   Two VUS's also found - one in ATM c.133C>T and the other in POLE c.1280C>T.  These will not affect medical management. The report date is November 12, 2018.   02/10/2019 -  Chemotherapy   The patient had trastuzumab-hyaluronidase-oysk (HERCEPTIN HYLECTA) 600-10000 MG-UNT/5ML chemo SQ injection 600 mg, 600 mg, Subcutaneous,  Once, 1 of 7 cycles Administration: 600 mg (06/16/2019) trastuzumab-dkst (OGIVRI) 462 mg in sodium chloride 0.9 % 250 mL chemo infusion, 6 mg/kg = 630 mg, Intravenous,  Once, 6 of 6 cycles Administration: 450 mg (03/03/2019), 450 mg (05/05/2019), 450 mg (05/26/2019), 450 mg (03/24/2019), 450 mg (04/14/2019)  for chemotherapy treatment.    02/22/2019 Surgery   Bilateral mastectomies ([name]) Left breast: benign tissue and no evidence of malignancy Right  breast: no residual carcinoma, 2 right axillary lymph nodes negative      CHIEF COMPLIANT: Follow-up of Herceptin maintenance  INTERVAL HISTORY:  [name] is a 39 y.o. with above-mentioned history of right breast cancerwho competedneoadjuvant chemotherapy,underwent bilateral mastectomies, and is currently on Herceptin maintenance. She is a participant in the UpBeat clinical trial.Echo on 06/30/19 showed an ejection fraction of 60-65%. She presents to the clinic todayfor treatment.   ALLERGIES:  is allergic to methocarbamol.  MEDICATIONS:  Current Outpatient Medications  Medication Sig Dispense Refill  . buPROPion (WELLBUTRIN XL) 150 MG 24 hr tablet Take 1 tablet (150 mg total) by mouth daily. 30 tablet 6  . cyclobenzaprine (FEXMID) 7.5 MG tablet     . LORazepam (ATIVAN) 0.5 MG tablet Take 1 tablet (0.5 mg total) by mouth at bedtime as needed for anxiety. 30 tablet 3  . sulfamethoxazole-trimethoprim (BACTRIM DS) 800-160 MG tablet Take by mouth.     No current facility-administered medications for this visit.    PHYSICAL EXAMINATION: ECOG PERFORMANCE STATUS: 1 - Symptomatic but completely ambulatory  There were no vitals filed for this visit. There were no vitals filed for this visit.  LABORATORY DATA:  I have reviewed the data as listed CMP Latest Ref Rng & Units 05/26/2019 04/14/2019 03/03/2019  Glucose 70 - 99 mg/dL 93 90 79  BUN 6 - 20 mg/dL '13 15 12  ' Creatinine 0.44 - 1.00 mg/dL 0.94 0.83 0.76  Sodium 135 - 145 mmol/L 142 141 140  Potassium 3.5 - 5.1 mmol/L 4.4 4.3 4.7  Chloride 98 - 111 mmol/L 109 107 107  CO2 22 - 32 mmol/L '25 24 25  ' Calcium 8.9 - 10.3 mg/dL 8.9 9.0 8.8(L)  Total Protein 6.5 - 8.1 g/dL 6.9 6.9 6.5  Total Bilirubin 0.3 - 1.2 mg/dL 0.3 0.3 <0.2(L)  Alkaline Phos 38 - 126 U/L 67 58 72  AST 15 - 41 U/L 15 14(L) 34  ALT 0 - 44 U/L 15 16 81(H)    Lab Results  Component Value Date   WBC 5.8 07/07/2019   HGB 13.2 07/07/2019   HCT 39.7 07/07/2019   MCV 89.2 07/07/2019   PLT 225 07/07/2019   NEUTROABS 4.0 07/07/2019    ASSESSMENT & PLAN:  Malignant neoplasm of upper-outer quadrant of right  breast in female, estrogen receptor negative ([place]) 10/11/2018:Patient palpated a right breast mass, mammogram revealed 1.6 cm mass, biopsy revealed grade 3 invasive ductal carcinoma ER 0%, PR 0%, Ki-67 20%, HER-2 3+ positive, T1CN0 stage IA  Treatment plan: 1. Neoadjuvant Taxol and Herceptin weekly x12. 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 Herceptin subcu toxicity: Injection site discomfort Patient bruised severely after the last injection. She is hoping that today's injection would be much better.  She would like me to consult with Dr. Denman George for hysterectomy at a later time. Continue with every 3-week Herceptin every 6 weeks to follow-up with me.    No orders of the  defined types were placed in this encounter.  The patient has a good understanding of the overall plan. she agrees with it. she will call with any problems that may develop before the next visit here.  Total time spent: 30 mins including face to face time and time spent for planning, charting and coordination of care  [name], MD 07/07/2019  I,  Cloyde Reams Dorshimer, am acting as scribe for Dr. [name].  I have reviewed the above documentation for accuracy and completeness, and I agree with the above.

## 2019-07-07 ENCOUNTER — Inpatient Hospital Stay: Payer: BC Managed Care – PPO

## 2019-07-07 ENCOUNTER — Other Ambulatory Visit: Payer: BC Managed Care – PPO

## 2019-07-07 ENCOUNTER — Inpatient Hospital Stay (HOSPITAL_BASED_OUTPATIENT_CLINIC_OR_DEPARTMENT_OTHER): Payer: BC Managed Care – PPO | Admitting: Hematology and Oncology

## 2019-07-07 ENCOUNTER — Other Ambulatory Visit: Payer: Self-pay

## 2019-07-07 ENCOUNTER — Inpatient Hospital Stay: Payer: BC Managed Care – PPO | Attending: Hematology and Oncology

## 2019-07-07 DIAGNOSIS — C50411 Malignant neoplasm of upper-outer quadrant of right female breast: Secondary | ICD-10-CM

## 2019-07-07 DIAGNOSIS — Z5112 Encounter for antineoplastic immunotherapy: Secondary | ICD-10-CM | POA: Diagnosis not present

## 2019-07-07 DIAGNOSIS — Z79899 Other long term (current) drug therapy: Secondary | ICD-10-CM | POA: Diagnosis not present

## 2019-07-07 DIAGNOSIS — Z171 Estrogen receptor negative status [ER-]: Secondary | ICD-10-CM | POA: Diagnosis not present

## 2019-07-07 LAB — CBC WITH DIFFERENTIAL (CANCER CENTER ONLY)
Abs Immature Granulocytes: 0.02 10*3/uL (ref 0.00–0.07)
Basophils Absolute: 0 10*3/uL (ref 0.0–0.1)
Basophils Relative: 1 %
Eosinophils Absolute: 0.1 10*3/uL (ref 0.0–0.5)
Eosinophils Relative: 2 %
HCT: 39.7 % (ref 36.0–46.0)
Hemoglobin: 13.2 g/dL (ref 12.0–15.0)
Immature Granulocytes: 0 %
Lymphocytes Relative: 23 %
Lymphs Abs: 1.3 10*3/uL (ref 0.7–4.0)
MCH: 29.7 pg (ref 26.0–34.0)
MCHC: 33.2 g/dL (ref 30.0–36.0)
MCV: 89.2 fL (ref 80.0–100.0)
Monocytes Absolute: 0.3 10*3/uL (ref 0.1–1.0)
Monocytes Relative: 6 %
Neutro Abs: 4 10*3/uL (ref 1.7–7.7)
Neutrophils Relative %: 68 %
Platelet Count: 225 10*3/uL (ref 150–400)
RBC: 4.45 MIL/uL (ref 3.87–5.11)
RDW: 13.9 % (ref 11.5–15.5)
WBC Count: 5.8 10*3/uL (ref 4.0–10.5)
nRBC: 0 % (ref 0.0–0.2)

## 2019-07-07 LAB — CMP (CANCER CENTER ONLY)
ALT: 10 U/L (ref 0–44)
AST: 11 U/L — ABNORMAL LOW (ref 15–41)
Albumin: 4.1 g/dL (ref 3.5–5.0)
Alkaline Phosphatase: 70 U/L (ref 38–126)
Anion gap: 9 (ref 5–15)
BUN: 18 mg/dL (ref 6–20)
CO2: 26 mmol/L (ref 22–32)
Calcium: 9.2 mg/dL (ref 8.9–10.3)
Chloride: 108 mmol/L (ref 98–111)
Creatinine: 0.88 mg/dL (ref 0.44–1.00)
GFR, Est AFR Am: >60 mL/min (ref >60)
GFR, Estimated: >60 mL/min (ref >60)
Glucose, Bld: 97 mg/dL (ref 70–99)
Potassium: 4.6 mmol/L (ref 3.5–5.1)
Sodium: 143 mmol/L (ref 135–145)
Total Bilirubin: 0.4 mg/dL (ref 0.3–1.2)
Total Protein: 7 g/dL (ref 6.5–8.1)

## 2019-07-07 MED ORDER — DIPHENHYDRAMINE HCL 25 MG PO CAPS
25.0000 mg | ORAL_CAPSULE | Freq: Once | ORAL | Status: AC
  Administered 2019-07-07: 25 mg via ORAL

## 2019-07-07 MED ORDER — ACETAMINOPHEN 325 MG PO TABS
650.0000 mg | ORAL_TABLET | Freq: Once | ORAL | Status: AC
  Administered 2019-07-07: 650 mg via ORAL

## 2019-07-07 MED ORDER — DIPHENHYDRAMINE HCL 25 MG PO CAPS
ORAL_CAPSULE | ORAL | Status: AC
  Filled 2019-07-07: qty 1

## 2019-07-07 MED ORDER — TRASTUZUMAB-HYALURONIDASE-OYSK 600-10000 MG-UNT/5ML ~~LOC~~ SOLN
600.0000 mg | Freq: Once | SUBCUTANEOUS | Status: AC
  Administered 2019-07-07: 600 mg via SUBCUTANEOUS
  Filled 2019-07-07: qty 5

## 2019-07-07 MED ORDER — ACETAMINOPHEN 325 MG PO TABS
ORAL_TABLET | ORAL | Status: AC
  Filled 2019-07-07: qty 2

## 2019-07-07 NOTE — Patient Instructions (Signed)
[facility] °Discharge Instructions for Patients Receiving Chemotherapy ° °Today you received the following chemotherapy agents Trastuzumab ° °To help prevent nausea and vomiting after your treatment, we encourage you to take your nausea medication as directed. °  °If you develop nausea and vomiting that is not controlled by your nausea medication, call the clinic.  ° °BELOW ARE SYMPTOMS THAT SHOULD BE REPORTED IMMEDIATELY: °· *FEVER GREATER THAN 100.5 F °· *CHILLS WITH OR WITHOUT FEVER °· NAUSEA AND VOMITING THAT IS NOT CONTROLLED WITH YOUR NAUSEA MEDICATION °· *UNUSUAL SHORTNESS OF BREATH °· *UNUSUAL BRUISING OR BLEEDING °· TENDERNESS IN MOUTH AND THROAT WITH OR WITHOUT PRESENCE OF ULCERS °· *URINARY PROBLEMS °· *BOWEL PROBLEMS °· UNUSUAL RASH °Items with * indicate a potential emergency and should be followed up as soon as possible. ° °Feel free to call the clinic should you have any questions or concerns. The clinic phone number is [phone]. ° °Please show the CHEMO ALERT CARD at check-in to the Emergency Department and triage nurse. ° ° °

## 2019-07-07 NOTE — Assessment & Plan Note (Signed)
10/11/2018:Patient palpated a right breast mass, mammogram revealed 1.6 cm mass, biopsy revealed grade 3 invasive ductal carcinoma ER 0%, PR 0%, Ki-67 20%, HER-2 3+ positive, T1CN0 stage IA  Treatment plan: 1. Neoadjuvant Taxol and Herceptin weekly x12. 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 Continue with every 3-week Herceptin every 6 weeks to follow-up with me.

## 2019-07-08 ENCOUNTER — Telehealth: Payer: Self-pay | Admitting: Hematology and Oncology

## 2019-07-08 NOTE — Telephone Encounter (Signed)
I talk with patient regarding schedule  

## 2019-07-11 DIAGNOSIS — Z9013 Acquired absence of bilateral breasts and nipples: Secondary | ICD-10-CM | POA: Diagnosis not present

## 2019-07-11 DIAGNOSIS — N9989 Other postprocedural complications and disorders of genitourinary system: Secondary | ICD-10-CM | POA: Diagnosis not present

## 2019-07-11 DIAGNOSIS — Y838 Other surgical procedures as the cause of abnormal reaction of the patient, or of later complication, without mention of misadventure at the time of the procedure: Secondary | ICD-10-CM | POA: Diagnosis not present

## 2019-07-11 DIAGNOSIS — Z79899 Other long term (current) drug therapy: Secondary | ICD-10-CM | POA: Diagnosis not present

## 2019-07-11 DIAGNOSIS — C50911 Malignant neoplasm of unspecified site of right female breast: Secondary | ICD-10-CM | POA: Diagnosis not present

## 2019-07-11 DIAGNOSIS — Z1509 Genetic susceptibility to other malignant neoplasm: Secondary | ICD-10-CM | POA: Diagnosis not present

## 2019-07-11 DIAGNOSIS — Z171 Estrogen receptor negative status [ER-]: Secondary | ICD-10-CM | POA: Diagnosis not present

## 2019-07-22 NOTE — Progress Notes (Signed)
Pharmacist Chemotherapy Monitoring - Follow Up Assessment    I verify that I have reviewed each item in the below checklist:  . Regimen for the patient is scheduled for the appropriate day and plan matches scheduled date. Marland Kitchen Appropriate non-routine labs are ordered dependent on drug ordered. . If applicable, additional medications reviewed and ordered per protocol based on lifetime cumulative doses and/or treatment regimen.   Plan for follow-up and/or issues identified: No . I-vent associated with next due treatment: No . MD and/or nursing notified: No  Philomena Course 07/22/2019 10:18 AM

## 2019-07-27 ENCOUNTER — Encounter: Payer: Self-pay | Admitting: *Deleted

## 2019-07-28 ENCOUNTER — Other Ambulatory Visit: Payer: Self-pay

## 2019-07-28 ENCOUNTER — Inpatient Hospital Stay: Payer: BC Managed Care – PPO

## 2019-07-28 VITALS — BP 140/97 | HR 78 | Temp 97.8°F | Resp 18 | Wt 173.2 lb

## 2019-07-28 DIAGNOSIS — Z79899 Other long term (current) drug therapy: Secondary | ICD-10-CM | POA: Diagnosis not present

## 2019-07-28 DIAGNOSIS — Z5112 Encounter for antineoplastic immunotherapy: Secondary | ICD-10-CM | POA: Diagnosis not present

## 2019-07-28 DIAGNOSIS — C50411 Malignant neoplasm of upper-outer quadrant of right female breast: Secondary | ICD-10-CM

## 2019-07-28 DIAGNOSIS — Z171 Estrogen receptor negative status [ER-]: Secondary | ICD-10-CM

## 2019-07-28 MED ORDER — DIPHENHYDRAMINE HCL 25 MG PO CAPS
25.0000 mg | ORAL_CAPSULE | Freq: Once | ORAL | Status: AC
  Administered 2019-07-28: 25 mg via ORAL

## 2019-07-28 MED ORDER — ACETAMINOPHEN 325 MG PO TABS
ORAL_TABLET | ORAL | Status: AC
  Filled 2019-07-28: qty 2

## 2019-07-28 MED ORDER — TRASTUZUMAB-HYALURONIDASE-OYSK 600-10000 MG-UNT/5ML ~~LOC~~ SOLN
600.0000 mg | Freq: Once | SUBCUTANEOUS | Status: AC
  Administered 2019-07-28: 600 mg via SUBCUTANEOUS
  Filled 2019-07-28: qty 5

## 2019-07-28 MED ORDER — DIPHENHYDRAMINE HCL 25 MG PO CAPS
ORAL_CAPSULE | ORAL | Status: AC
  Filled 2019-07-28: qty 1

## 2019-07-28 MED ORDER — ACETAMINOPHEN 325 MG PO TABS
650.0000 mg | ORAL_TABLET | Freq: Once | ORAL | Status: AC
  Administered 2019-07-28: 650 mg via ORAL

## 2019-07-28 NOTE — Patient Instructions (Signed)
[facility] Discharge Instructions for Patients Receiving Chemotherapy  Today you received the following chemotherapy agents herceptin hylecta   To help prevent nausea and vomiting after your treatment, we encourage you to take your nausea medication as directed.    If you develop nausea and vomiting that is not controlled by your nausea medication, call the clinic.   BELOW ARE SYMPTOMS THAT SHOULD BE REPORTED IMMEDIATELY:  *FEVER GREATER THAN 100.5 F  *CHILLS WITH OR WITHOUT FEVER  NAUSEA AND VOMITING THAT IS NOT CONTROLLED WITH YOUR NAUSEA MEDICATION  *UNUSUAL SHORTNESS OF BREATH  *UNUSUAL BRUISING OR BLEEDING  TENDERNESS IN MOUTH AND THROAT WITH OR WITHOUT PRESENCE OF ULCERS  *URINARY PROBLEMS  *BOWEL PROBLEMS  UNUSUAL RASH Items with * indicate a potential emergency and should be followed up as soon as possible.  Feel free to call the clinic should you have any questions or concerns. The clinic phone number is [phone].  Please show the CHEMO ALERT CARD at check-in to the Emergency Department and triage nurse.   

## 2019-08-12 NOTE — Progress Notes (Signed)
Pharmacist Chemotherapy Monitoring - Follow Up Assessment    I verify that I have reviewed each item in the below checklist:  . Regimen for the patient is scheduled for the appropriate day and plan matches scheduled date. Marland Kitchen Appropriate non-routine labs are ordered dependent on drug ordered. . If applicable, additional medications reviewed and ordered per protocol based on lifetime cumulative doses and/or treatment regimen.   Plan for follow-up and/or issues identified: No . I-vent associated with next due treatment: No . MD and/or nursing notified: No  Maliha Outten, Jacqlyn Larsen 08/12/2019 2:37 PM

## 2019-08-17 ENCOUNTER — Encounter: Payer: Self-pay | Admitting: *Deleted

## 2019-08-17 NOTE — Progress Notes (Signed)
Patient Care Team: [name], [place] as PCP - General (Nurse Practitioner)  DIAGNOSIS:    ICD-10-CM   1. Malignant neoplasm of upper-outer quadrant of right breast in female, estrogen receptor negative ([place])  C50.411    Z17.1     SUMMARY OF ONCOLOGIC HISTORY: Oncology History  Malignant neoplasm of upper-outer quadrant of right breast in female, estrogen receptor negative ([place])  10/11/2018 Initial Diagnosis   Patient palpated a right breast mass, mammogram revealed 1.6 cm mass, biopsy revealed grade 3 invasive ductal carcinoma ER 0%, PR 0%, Ki-67 20%, HER-2 3+ positive, T1CN0 stage IA   10/19/2018 Cancer Staging   Staging form: Breast, AJCC 8th Edition - Clinical stage from 10/19/2018: Stage IA (cT1c, cN0, cM0, G3, ER-, PR-, HER2+) - Signed by [name], MD on 10/19/2018   10/29/2018 - 01/26/2019 Chemotherapy   The patient had trastuzumab (HERCEPTIN) 300 mg in sodium chloride 0.9 % 250 mL chemo infusion, 294 mg, Intravenous,  Once, 2 of 2 cycles Administration: 300 mg (10/29/2018), 150 mg (11/03/2018), 150 mg (12/02/2018), 150 mg (11/12/2018), 150 mg (11/25/2018), 150 mg (12/09/2018) PACLitaxel (TAXOL) 150 mg in sodium chloride 0.9 % 250 mL chemo infusion (</= 4m/m2), 80 mg/m2 = 150 mg, Intravenous,  Once, 3 of 3 cycles Dose modification: 65 mg/m2 (original dose 80 mg/m2, Cycle 3, Reason: Dose not tolerated) Administration: 150 mg (10/29/2018), 150 mg (11/03/2018), 150 mg (12/02/2018), 150 mg (11/12/2018), 150 mg (11/25/2018), 150 mg (12/09/2018), 150 mg (12/16/2018), 150 mg (12/23/2018), 120 mg (12/30/2018), 120 mg (01/06/2019), 120 mg (01/13/2019), 120 mg (01/20/2019) trastuzumab-dkst (OGIVRI) 150 mg in sodium chloride 0.9 % 250 mL chemo infusion, 150 mg (100 % of original dose 150 mg), Intravenous,  Once, 2 of 2 cycles Dose modification: 150 mg (original dose 150 mg, Cycle 2, Reason: Other (see comments), Comment: Biosimilar Conversion), 450 mg (original dose 150 mg, Cycle 3, Reason: Other (see comments),  Comment: Biosimilar Conversion) Administration: 150 mg (12/16/2018), 150 mg (12/23/2018), 150 mg (12/30/2018), 150 mg (01/06/2019), 150 mg (01/13/2019), 450 mg (01/20/2019)  for chemotherapy treatment.    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  The Common Hereditary Gene Panel offered by [facility] includes sequencing and/or deletion duplication testing of the following 48 genes: APC, ATM, AXIN2, BARD1, BMPR1A, BRCA1, BRCA2, BRIP1, CDH1, CDK4, CDKN2A (p14ARF), CDKN2A (p16INK4a), CHEK2, CTNNA1, DICER1, EPCAM (Deletion/duplication testing only), GREM1 (promoter region deletion/duplication testing only), KIT, MEN1, MLH1, MSH2, MSH3, MSH6, MUTYH, NBN, NF1, NHTL1, PALB2, PDGFRA, PMS2, POLD1, POLE, PTEN, RAD50, RAD51C, RAD51D, RNF43, SDHB, SDHC, SDHD, SMAD4, SMARCA4. STK11, TP53, TSC1, TSC2, and VHL.  The following genes were evaluated for sequence changes only: SDHA and HOXB13 c.251G>A variant only.   Two VUS's also found - one in ATM c.133C>T and the other in POLE c.1280C>T.  These will not affect medical management. The report date is November 12, 2018.   02/10/2019 -  Chemotherapy   The patient had trastuzumab-hyaluronidase-oysk (HERCEPTIN HYLECTA) 600-10000 MG-UNT/5ML chemo SQ injection 600 mg, 600 mg, Subcutaneous,  Once, 3 of 7 cycles Administration: 600 mg (06/16/2019), 600 mg (07/07/2019), 600 mg (07/28/2019) trastuzumab-dkst (OGIVRI) 462 mg in sodium chloride 0.9 % 250 mL chemo infusion, 6 mg/kg = 630 mg, Intravenous,  Once, 6 of 6 cycles Administration: 450 mg (03/03/2019), 450 mg (05/05/2019), 450 mg (05/26/2019), 450 mg (03/24/2019), 450 mg (04/14/2019)  for chemotherapy treatment.    02/22/2019 Surgery   Bilateral mastectomies ([name]) Left breast: benign tissue  and no evidence of malignancy Right breast: no residual carcinoma, 2 right axillary lymph nodes negative      CHIEF COMPLIANT: Follow-up of  Herceptin maintenance  INTERVAL HISTORY: [name] is a 39 y.o. with above-mentioned history of right breast cancerwho competedneoadjuvant chemotherapy,underwent bilateral mastectomies, and is currently on Herceptin maintenance.She is a participant in the UpBeat clinical trial. She presents to the clinic todayfor treatment.She is doing very well from the Herceptin injections.  Initially she had bruising but that has resolved.  ALLERGIES:  is allergic to methocarbamol.  MEDICATIONS:  Current Outpatient Medications  Medication Sig Dispense Refill  . buPROPion (WELLBUTRIN XL) 150 MG 24 hr tablet Take 1 tablet (150 mg total) by mouth daily. 30 tablet 6  . cyclobenzaprine (FEXMID) 7.5 MG tablet     . LORazepam (ATIVAN) 0.5 MG tablet Take 1 tablet (0.5 mg total) by mouth at bedtime as needed for anxiety. 30 tablet 3  . sulfamethoxazole-trimethoprim (BACTRIM DS) 800-160 MG tablet Take by mouth.     No current facility-administered medications for this visit.    PHYSICAL EXAMINATION: ECOG PERFORMANCE STATUS: 0 - Asymptomatic  Vitals:   08/18/19 0915  BP: 122/77  Pulse: 69  Resp: 18  Temp: 97.8 F (36.6 C)  SpO2: 99%   Filed Weights   08/18/19 0915  Weight: 169 lb 9.6 oz (76.9 kg)    LABORATORY DATA:  I have reviewed the data as listed CMP Latest Ref Rng & Units 07/07/2019 05/26/2019 04/14/2019  Glucose 70 - 99 mg/dL 97 93 90  BUN 6 - 20 mg/dL '18 13 15  ' Creatinine 0.44 - 1.00 mg/dL 0.88 0.94 0.83  Sodium 135 - 145 mmol/L 143 142 141  Potassium 3.5 - 5.1 mmol/L 4.6 4.4 4.3  Chloride 98 - 111 mmol/L 108 109 107  CO2 22 - 32 mmol/L '26 25 24  ' Calcium 8.9 - 10.3 mg/dL 9.2 8.9 9.0  Total Protein 6.5 - 8.1 g/dL 7.0 6.9 6.9  Total Bilirubin 0.3 - 1.2 mg/dL 0.4 0.3 0.3  Alkaline Phos 38 - 126 U/L 70 67 58  AST 15 - 41 U/L 11(L) 15 14(L)  ALT 0 - 44 U/L '10 15 16    ' Lab Results  Component Value Date   WBC 5.8 07/07/2019   HGB 13.2 07/07/2019   HCT 39.7 07/07/2019   MCV  89.2 07/07/2019   PLT 225 07/07/2019   NEUTROABS 4.0 07/07/2019    ASSESSMENT & PLAN:  Malignant neoplasm of upper-outer quadrant of right breast in female, estrogen receptor negative ([place]) 10/11/2018:Patient palpated a right breast mass, mammogram revealed 1.6 cm mass, biopsy revealed grade 3 invasive ductal carcinoma ER 0%, PR 0%, Ki-67 20%, HER-2 3+ positive, T1CN0 stage IA  Treatment plan: 1. Neoadjuvant Taxol and Herceptin weekly x12. Followed by Herceptin maintenance therapy every 3 weeks to complete 1 year.10/29/2018-01/26/2019 2.Breast conserving surgery with sentinel lymph node biopsy10/20/2020:Bilateral mastectomies [name]) Left breast: benign tissue and no evidence of malignancy Right breast: no residual carcinoma, 2 right axillary lymph nodes negative 3.Adjuvant radiation therapy Upbeat clinical trial: No adverse effects from participating in the trial Lynch syndrome positive on genetic testing (I discussed with her about aspirin 600 mg for prevention of colon cancer risk: Study published in Lancet) ---------------------------------------------------------------------------------------------------------------------------------------- Depression/anxiety/panic attacks:On Wellbutrin Chemo-induced peripheral neuropathy:No further problems  Current treatment: Herceptin maintenance subcutaneously until 10/20/2019. Herceptin subcu toxicity: Injection site discomfort Patient bruised severely initially.  This has not continued.  We will need to consult Dr. Denman George for  hysterectomy. Continue with every 3-week Herceptin every 6 weeks to follow-up with me.  I will see her with the last Herceptin injection.    No orders of the defined types were placed in this encounter.  The patient has a good understanding of the overall plan. she agrees with it. she will call with any problems that may develop before the next visit here.  Total time spent: 30 mins including  face to face time and time spent for planning, charting and coordination of care  [name], MD 08/18/2019  I, Cloyde Reams Dorshimer, am acting as scribe for Dr. [name].  I have reviewed the above documentation for accuracy and completeness, and I agree with the above.

## 2019-08-18 ENCOUNTER — Inpatient Hospital Stay (HOSPITAL_BASED_OUTPATIENT_CLINIC_OR_DEPARTMENT_OTHER): Payer: BC Managed Care – PPO | Admitting: Hematology and Oncology

## 2019-08-18 ENCOUNTER — Other Ambulatory Visit: Payer: Self-pay

## 2019-08-18 ENCOUNTER — Inpatient Hospital Stay: Payer: BC Managed Care – PPO | Attending: Hematology and Oncology

## 2019-08-18 DIAGNOSIS — Z171 Estrogen receptor negative status [ER-]: Secondary | ICD-10-CM | POA: Insufficient documentation

## 2019-08-18 DIAGNOSIS — Z79899 Other long term (current) drug therapy: Secondary | ICD-10-CM | POA: Diagnosis not present

## 2019-08-18 DIAGNOSIS — Z5112 Encounter for antineoplastic immunotherapy: Secondary | ICD-10-CM | POA: Insufficient documentation

## 2019-08-18 DIAGNOSIS — C50411 Malignant neoplasm of upper-outer quadrant of right female breast: Secondary | ICD-10-CM | POA: Diagnosis not present

## 2019-08-18 DIAGNOSIS — Z9013 Acquired absence of bilateral breasts and nipples: Secondary | ICD-10-CM | POA: Insufficient documentation

## 2019-08-18 MED ORDER — ACETAMINOPHEN 325 MG PO TABS
650.0000 mg | ORAL_TABLET | Freq: Once | ORAL | Status: AC
  Administered 2019-08-18: 650 mg via ORAL

## 2019-08-18 MED ORDER — ACETAMINOPHEN 325 MG PO TABS
ORAL_TABLET | ORAL | Status: AC
  Filled 2019-08-18: qty 2

## 2019-08-18 MED ORDER — TRASTUZUMAB-HYALURONIDASE-OYSK 600-10000 MG-UNT/5ML ~~LOC~~ SOLN
600.0000 mg | Freq: Once | SUBCUTANEOUS | Status: AC
  Administered 2019-08-18: 600 mg via SUBCUTANEOUS
  Filled 2019-08-18: qty 5

## 2019-08-18 MED ORDER — DIPHENHYDRAMINE HCL 25 MG PO CAPS
ORAL_CAPSULE | ORAL | Status: AC
  Filled 2019-08-18: qty 1

## 2019-08-18 MED ORDER — DIPHENHYDRAMINE HCL 25 MG PO CAPS
25.0000 mg | ORAL_CAPSULE | Freq: Once | ORAL | Status: AC
  Administered 2019-08-18: 25 mg via ORAL

## 2019-08-18 NOTE — Patient Instructions (Signed)
[facility] Discharge Instructions for Patients Receiving Chemotherapy  Today you received the following chemotherapy agents: hylecta  To help prevent nausea and vomiting after your treatment, we encourage you to take your nausea medication as directed.   If you develop nausea and vomiting that is not controlled by your nausea medication, call the clinic.   BELOW ARE SYMPTOMS THAT SHOULD BE REPORTED IMMEDIATELY:  *FEVER GREATER THAN 100.5 F  *CHILLS WITH OR WITHOUT FEVER  NAUSEA AND VOMITING THAT IS NOT CONTROLLED WITH YOUR NAUSEA MEDICATION  *UNUSUAL SHORTNESS OF BREATH  *UNUSUAL BRUISING OR BLEEDING  TENDERNESS IN MOUTH AND THROAT WITH OR WITHOUT PRESENCE OF ULCERS  *URINARY PROBLEMS  *BOWEL PROBLEMS  UNUSUAL RASH Items with * indicate a potential emergency and should be followed up as soon as possible.  Feel free to call the clinic should you have any questions or concerns. The clinic phone number is [phone].  Please show the [place] at check-in to the Emergency Department and triage nurse.

## 2019-08-18 NOTE — Assessment & Plan Note (Signed)
10/11/2018:Patient palpated a right breast mass, mammogram revealed 1.6 cm mass, biopsy revealed grade 3 invasive ductal carcinoma ER 0%, PR 0%, Ki-67 20%, HER-2 3+ positive, T1CN0 stage IA  Treatment plan: 1. Neoadjuvant Taxol and Herceptin weekly x12. Followed by Herceptin maintenance therapy every 3 weeks to complete 1 year.10/29/2018-01/26/2019 2.Breast conserving surgery with sentinel lymph node biopsy10/20/2020:Bilateral mastectomies [name]) Left breast: benign tissue and no evidence of malignancy Right breast: no residual carcinoma, 2 right axillary lymph nodes negative 3.Adjuvant radiation therapy Upbeat clinical trial: No adverse effects from participating in the trial Lynch syndrome positive on genetic testing (I discussed with her about aspirin 600 mg for prevention of colon cancer risk: Study published in Lancet) ---------------------------------------------------------------------------------------------------------------------------------------- Depression/anxiety/panic attacks:On Wellbutrin Chemo-induced peripheral neuropathy:No further problems  Current treatment: Herceptin maintenance subcutaneously until 10/20/2019. Herceptin subcu toxicity: Injection site discomfort Patient bruised severely  We will need to consult Dr. Denman George for hysterectomy. Continue with every 3-week Herceptin every 6 weeks to follow-up with me.

## 2019-08-26 DIAGNOSIS — C50411 Malignant neoplasm of upper-outer quadrant of right female breast: Secondary | ICD-10-CM | POA: Diagnosis not present

## 2019-09-02 NOTE — Progress Notes (Signed)
Pharmacist Chemotherapy Monitoring - Follow Up Assessment    I verify that I have reviewed each item in the below checklist:  . Regimen for the patient is scheduled for the appropriate day and plan matches scheduled date. Marland Kitchen Appropriate non-routine labs are ordered dependent on drug ordered. . If applicable, additional medications reviewed and ordered per protocol based on lifetime cumulative doses and/or treatment regimen.   Plan for follow-up and/or issues identified: No . I-vent associated with next due treatment: No . MD and/or nursing notified: No  Philomena Course 09/02/2019 12:26 PM

## 2019-09-08 ENCOUNTER — Inpatient Hospital Stay: Payer: BC Managed Care – PPO | Attending: Hematology and Oncology

## 2019-09-08 ENCOUNTER — Other Ambulatory Visit: Payer: Self-pay

## 2019-09-08 VITALS — BP 138/81 | HR 73 | Temp 98.5°F | Resp 16 | Wt 169.0 lb

## 2019-09-08 DIAGNOSIS — Z79899 Other long term (current) drug therapy: Secondary | ICD-10-CM | POA: Insufficient documentation

## 2019-09-08 DIAGNOSIS — Z5112 Encounter for antineoplastic immunotherapy: Secondary | ICD-10-CM | POA: Diagnosis not present

## 2019-09-08 DIAGNOSIS — C50411 Malignant neoplasm of upper-outer quadrant of right female breast: Secondary | ICD-10-CM | POA: Insufficient documentation

## 2019-09-08 DIAGNOSIS — Z171 Estrogen receptor negative status [ER-]: Secondary | ICD-10-CM | POA: Insufficient documentation

## 2019-09-08 MED ORDER — DIPHENHYDRAMINE HCL 25 MG PO CAPS
25.0000 mg | ORAL_CAPSULE | Freq: Once | ORAL | Status: AC
  Administered 2019-09-08: 25 mg via ORAL

## 2019-09-08 MED ORDER — ACETAMINOPHEN 325 MG PO TABS
650.0000 mg | ORAL_TABLET | Freq: Once | ORAL | Status: AC
  Administered 2019-09-08: 650 mg via ORAL

## 2019-09-08 MED ORDER — DIPHENHYDRAMINE HCL 25 MG PO CAPS
ORAL_CAPSULE | ORAL | Status: AC
  Filled 2019-09-08: qty 1

## 2019-09-08 MED ORDER — ACETAMINOPHEN 325 MG PO TABS
ORAL_TABLET | ORAL | Status: AC
  Filled 2019-09-08: qty 2

## 2019-09-08 MED ORDER — TRASTUZUMAB-HYALURONIDASE-OYSK 600-10000 MG-UNT/5ML ~~LOC~~ SOLN
600.0000 mg | Freq: Once | SUBCUTANEOUS | Status: AC
  Administered 2019-09-08: 600 mg via SUBCUTANEOUS
  Filled 2019-09-08: qty 5

## 2019-09-08 NOTE — Patient Instructions (Signed)
[facility] °Discharge Instructions for Patients Receiving Chemotherapy ° °Today you received the following chemotherapy agents: Herceptin Hylecta. ° °To help prevent nausea and vomiting after your treatment, we encourage you to take your nausea medication as directed. °  °If you develop nausea and vomiting that is not controlled by your nausea medication, call the clinic.  ° °BELOW ARE SYMPTOMS THAT SHOULD BE REPORTED IMMEDIATELY: °· *FEVER GREATER THAN 100.5 F °· *CHILLS WITH OR WITHOUT FEVER °· NAUSEA AND VOMITING THAT IS NOT CONTROLLED WITH YOUR NAUSEA MEDICATION °· *UNUSUAL SHORTNESS OF BREATH °· *UNUSUAL BRUISING OR BLEEDING °· TENDERNESS IN MOUTH AND THROAT WITH OR WITHOUT PRESENCE OF ULCERS °· *URINARY PROBLEMS °· *BOWEL PROBLEMS °· UNUSUAL RASH °Items with * indicate a potential emergency and should be followed up as soon as possible. ° °Feel free to call the clinic should you have any questions or concerns. The clinic phone number is [phone]. ° °Please show the CHEMO ALERT CARD at check-in to the Emergency Department and triage nurse. ° ° °

## 2019-09-28 ENCOUNTER — Encounter: Payer: Self-pay | Admitting: *Deleted

## 2019-09-28 NOTE — Progress Notes (Signed)
Patient Care Team: [name], [place] as PCP - General (Nurse Practitioner)  DIAGNOSIS:    ICD-10-CM   1. Malignant neoplasm of upper-outer quadrant of right breast in female, estrogen receptor negative ([place])  C50.411    Z17.1     SUMMARY OF ONCOLOGIC HISTORY: Oncology History  Malignant neoplasm of upper-outer quadrant of right breast in female, estrogen receptor negative ([place])  10/11/2018 Initial Diagnosis   Patient palpated a right breast mass, mammogram revealed 1.6 cm mass, biopsy revealed grade 3 invasive ductal carcinoma ER 0%, PR 0%, Ki-67 20%, HER-2 3+ positive, T1CN0 stage IA   10/19/2018 Cancer Staging   Staging form: Breast, AJCC 8th Edition - Clinical stage from 10/19/2018: Stage IA (cT1c, cN0, cM0, G3, ER-, PR-, HER2+) - Signed by [name], MD on 10/19/2018   10/29/2018 - 01/26/2019 Chemotherapy   The patient had trastuzumab (HERCEPTIN) 300 mg in sodium chloride 0.9 % 250 mL chemo infusion, 294 mg, Intravenous,  Once, 2 of 2 cycles Administration: 300 mg (10/29/2018), 150 mg (11/03/2018), 150 mg (12/02/2018), 150 mg (11/12/2018), 150 mg (11/25/2018), 150 mg (12/09/2018) PACLitaxel (TAXOL) 150 mg in sodium chloride 0.9 % 250 mL chemo infusion (</= 43m/m2), 80 mg/m2 = 150 mg, Intravenous,  Once, 3 of 3 cycles Dose modification: 65 mg/m2 (original dose 80 mg/m2, Cycle 3, Reason: Dose not tolerated) Administration: 150 mg (10/29/2018), 150 mg (11/03/2018), 150 mg (12/02/2018), 150 mg (11/12/2018), 150 mg (11/25/2018), 150 mg (12/09/2018), 150 mg (12/16/2018), 150 mg (12/23/2018), 120 mg (12/30/2018), 120 mg (01/06/2019), 120 mg (01/13/2019), 120 mg (01/20/2019) trastuzumab-dkst (OGIVRI) 150 mg in sodium chloride 0.9 % 250 mL chemo infusion, 150 mg (100 % of original dose 150 mg), Intravenous,  Once, 2 of 2 cycles Dose modification: 150 mg (original dose 150 mg, Cycle 2, Reason: Other (see comments), Comment: Biosimilar Conversion), 450 mg (original dose 150 mg, Cycle 3, Reason: Other (see comments),  Comment: Biosimilar Conversion) Administration: 150 mg (12/16/2018), 150 mg (12/23/2018), 150 mg (12/30/2018), 150 mg (01/06/2019), 150 mg (01/13/2019), 450 mg (01/20/2019)  for chemotherapy treatment.    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  The Common Hereditary Gene Panel offered by [facility] includes sequencing and/or deletion duplication testing of the following 48 genes: APC, ATM, AXIN2, BARD1, BMPR1A, BRCA1, BRCA2, BRIP1, CDH1, CDK4, CDKN2A (p14ARF), CDKN2A (p16INK4a), CHEK2, CTNNA1, DICER1, EPCAM (Deletion/duplication testing only), GREM1 (promoter region deletion/duplication testing only), KIT, MEN1, MLH1, MSH2, MSH3, MSH6, MUTYH, NBN, NF1, NHTL1, PALB2, PDGFRA, PMS2, POLD1, POLE, PTEN, RAD50, RAD51C, RAD51D, RNF43, SDHB, SDHC, SDHD, SMAD4, SMARCA4. STK11, TP53, TSC1, TSC2, and VHL.  The following genes were evaluated for sequence changes only: SDHA and HOXB13 c.251G>A variant only.   Two VUS's also found - one in ATM c.133C>T and the other in POLE c.1280C>T.  These will not affect medical management. The report date is November 12, 2018.   02/10/2019 -  Chemotherapy   The patient had trastuzumab-hyaluronidase-oysk (HERCEPTIN HYLECTA) 600-10000 MG-UNT/5ML chemo SQ injection 600 mg, 600 mg, Subcutaneous,  Once, 5 of 7 cycles Administration: 600 mg (06/16/2019), 600 mg (07/07/2019), 600 mg (07/28/2019), 600 mg (08/18/2019), 600 mg (09/08/2019) trastuzumab-dkst (OGIVRI) 462 mg in sodium chloride 0.9 % 250 mL chemo infusion, 6 mg/kg = 630 mg, Intravenous,  Once, 6 of 6 cycles Administration: 450 mg (03/03/2019), 450 mg (05/05/2019), 450 mg (05/26/2019), 450 mg (03/24/2019), 450 mg (04/14/2019)  for chemotherapy treatment.    02/22/2019 Surgery   Bilateral mastectomies ([name]) Left breast: benign tissue and no evidence of malignancy Right breast: no residual carcinoma, 2 right axillary lymph nodes negative       CHIEF COMPLIANT: Follow-up of Herceptin maintenance  INTERVAL HISTORY: [name] is a 39 y.o. with above-mentioned history of right breast cancerwho competedneoadjuvant chemotherapy,underwent bilateral mastectomies, and is currently on Herceptin maintenance.She is a participant in the UpBeat clinical trial.She presents to the clinic todayfor treatment.  ALLERGIES:  is allergic to methocarbamol.  MEDICATIONS:  Current Outpatient Medications  Medication Sig Dispense Refill  . buPROPion (WELLBUTRIN XL) 150 MG 24 hr tablet Take 1 tablet (150 mg total) by mouth daily. 30 tablet 6  . LORazepam (ATIVAN) 0.5 MG tablet Take 1 tablet (0.5 mg total) by mouth at bedtime as needed for anxiety. 30 tablet 3   No current facility-administered medications for this visit.    PHYSICAL EXAMINATION: ECOG PERFORMANCE STATUS: 1 - Symptomatic but completely ambulatory  Vitals:   09/29/19 0915  BP: 122/81  Pulse: 65  Resp: 20  Temp: 98.5 F (36.9 C)  SpO2: 100%   Filed Weights   09/29/19 0915  Weight: 170 lb 4.8 oz (77.2 kg)    LABORATORY DATA:  I have reviewed the data as listed CMP Latest Ref Rng & Units 07/07/2019 05/26/2019 04/14/2019  Glucose 70 - 99 mg/dL 97 93 90  BUN 6 - 20 mg/dL '18 13 15  ' Creatinine 0.44 - 1.00 mg/dL 0.88 0.94 0.83  Sodium 135 - 145 mmol/L 143 142 141  Potassium 3.5 - 5.1 mmol/L 4.6 4.4 4.3  Chloride 98 - 111 mmol/L 108 109 107  CO2 22 - 32 mmol/L '26 25 24  ' Calcium 8.9 - 10.3 mg/dL 9.2 8.9 9.0  Total Protein 6.5 - 8.1 g/dL 7.0 6.9 6.9  Total Bilirubin 0.3 - 1.2 mg/dL 0.4 0.3 0.3  Alkaline Phos 38 - 126 U/L 70 67 58  AST 15 - 41 U/L 11(L) 15 14(L)  ALT 0 - 44 U/L '10 15 16    ' Lab Results  Component Value Date   WBC 5.8 07/07/2019   HGB 13.2 07/07/2019   HCT 39.7 07/07/2019   MCV 89.2 07/07/2019   PLT 225 07/07/2019   NEUTROABS 4.0 07/07/2019    ASSESSMENT & PLAN:  Malignant neoplasm of upper-outer quadrant of right breast in female, estrogen  receptor negative ([place]) 10/11/2018:Patient palpated a right breast mass, mammogram revealed 1.6 cm mass, biopsy revealed grade 3 invasive ductal carcinoma ER 0%, PR 0%, Ki-67 20%, HER-2 3+ positive, T1CN0 stage IA  Treatment plan: 1. Neoadjuvant Taxol and Herceptin weekly x12. Followed by Herceptin maintenance therapy every 3 weeks to complete 1 year.10/29/2018-01/26/2019 2.02/22/2019:Bilateral mastectomies [name]) Left breast: benign tissue and no evidence of malignancy Right breast: no residual carcinoma, 2 right axillary lymph nodes negative  Upbeat clinical trial: No adverse effects from participating in the trial Lynch syndrome positive on genetic testing (I discussed with her about aspirin 600 mg for prevention of colon cancer risk: Study published in [place]) ---------------------------------------------------------------------------------------------------------------------------------------- Depression/anxiety/panic attacks:On Wellbutrin Chemo-induced peripheral neuropathy:No further problems  Current treatment: Herceptin maintenance subcutaneously until 10/20/2019. Herceptin subcu toxicity: Injection site discomfort and bruising  Patient is going through a divorce and it is getting finalized later next month. She is looking forward to carrying on with her new life.  We will need to consult Dr. Denman George for hysterectomy. Return to clinic in 6 months for follow-up.   No orders of the defined types were placed in this encounter.  The patient has a good understanding of the overall plan. she agrees with it. she will call with any problems that may develop before the next visit here.  Total time spent: 30 mins including face to face time and time spent for planning, charting and coordination of care  [name], MD 09/29/2019  I, Cloyde Reams Dorshimer, am acting as scribe for Dr. [name].  I have reviewed the above documentation for accuracy and  completeness, and I agree with the above.

## 2019-09-29 ENCOUNTER — Inpatient Hospital Stay (HOSPITAL_BASED_OUTPATIENT_CLINIC_OR_DEPARTMENT_OTHER): Payer: BC Managed Care – PPO | Admitting: Hematology and Oncology

## 2019-09-29 ENCOUNTER — Other Ambulatory Visit: Payer: Self-pay

## 2019-09-29 ENCOUNTER — Inpatient Hospital Stay: Payer: BC Managed Care – PPO

## 2019-09-29 DIAGNOSIS — Z171 Estrogen receptor negative status [ER-]: Secondary | ICD-10-CM

## 2019-09-29 DIAGNOSIS — Z79899 Other long term (current) drug therapy: Secondary | ICD-10-CM | POA: Diagnosis not present

## 2019-09-29 DIAGNOSIS — Z5112 Encounter for antineoplastic immunotherapy: Secondary | ICD-10-CM | POA: Diagnosis not present

## 2019-09-29 DIAGNOSIS — C50411 Malignant neoplasm of upper-outer quadrant of right female breast: Secondary | ICD-10-CM | POA: Diagnosis not present

## 2019-09-29 MED ORDER — DIPHENHYDRAMINE HCL 25 MG PO CAPS
25.0000 mg | ORAL_CAPSULE | Freq: Once | ORAL | Status: AC
  Administered 2019-09-29: 25 mg via ORAL

## 2019-09-29 MED ORDER — TRASTUZUMAB-HYALURONIDASE-OYSK 600-10000 MG-UNT/5ML ~~LOC~~ SOLN
600.0000 mg | Freq: Once | SUBCUTANEOUS | Status: AC
  Administered 2019-09-29: 600 mg via SUBCUTANEOUS
  Filled 2019-09-29: qty 5

## 2019-09-29 MED ORDER — DIPHENHYDRAMINE HCL 25 MG PO CAPS
ORAL_CAPSULE | ORAL | Status: AC
  Filled 2019-09-29: qty 1

## 2019-09-29 MED ORDER — GOSERELIN ACETATE 3.6 MG ~~LOC~~ IMPL
DRUG_IMPLANT | SUBCUTANEOUS | Status: AC
  Filled 2019-09-29: qty 3.6

## 2019-09-29 MED ORDER — PEGFILGRASTIM-CBQV 6 MG/0.6ML ~~LOC~~ SOSY
PREFILLED_SYRINGE | SUBCUTANEOUS | Status: AC
  Filled 2019-09-29: qty 0.6

## 2019-09-29 MED ORDER — ACETAMINOPHEN 325 MG PO TABS
650.0000 mg | ORAL_TABLET | Freq: Once | ORAL | Status: AC
  Administered 2019-09-29: 650 mg via ORAL

## 2019-09-29 MED ORDER — DENOSUMAB 120 MG/1.7ML ~~LOC~~ SOLN
SUBCUTANEOUS | Status: AC
  Filled 2019-09-29: qty 1.7

## 2019-09-29 MED ORDER — ACETAMINOPHEN 325 MG PO TABS
ORAL_TABLET | ORAL | Status: AC
  Filled 2019-09-29: qty 2

## 2019-09-29 NOTE — Patient Instructions (Signed)
[facility] °Discharge Instructions for Patients Receiving Chemotherapy ° °Today you received the following chemotherapy agents: Herceptin Hylecta. ° °To help prevent nausea and vomiting after your treatment, we encourage you to take your nausea medication as directed. °  °If you develop nausea and vomiting that is not controlled by your nausea medication, call the clinic.  ° °BELOW ARE SYMPTOMS THAT SHOULD BE REPORTED IMMEDIATELY: °· *FEVER GREATER THAN 100.5 F °· *CHILLS WITH OR WITHOUT FEVER °· NAUSEA AND VOMITING THAT IS NOT CONTROLLED WITH YOUR NAUSEA MEDICATION °· *UNUSUAL SHORTNESS OF BREATH °· *UNUSUAL BRUISING OR BLEEDING °· TENDERNESS IN MOUTH AND THROAT WITH OR WITHOUT PRESENCE OF ULCERS °· *URINARY PROBLEMS °· *BOWEL PROBLEMS °· UNUSUAL RASH °Items with * indicate a potential emergency and should be followed up as soon as possible. ° °Feel free to call the clinic should you have any questions or concerns. The clinic phone number is [phone]. ° °Please show the CHEMO ALERT CARD at check-in to the Emergency Department and triage nurse. ° ° °

## 2019-09-29 NOTE — Assessment & Plan Note (Signed)
10/11/2018:Patient palpated a right breast mass, mammogram revealed 1.6 cm mass, biopsy revealed grade 3 invasive ductal carcinoma ER 0%, PR 0%, Ki-67 20%, HER-2 3+ positive, T1CN0 stage IA  Treatment plan: 1. Neoadjuvant Taxol and Herceptin weekly x12. Followed by Herceptin maintenance therapy every 3 weeks to complete 1 year.10/29/2018-01/26/2019 2.02/22/2019:Bilateral mastectomies [name]) Left breast: benign tissue and no evidence of malignancy Right breast: no residual carcinoma, 2 right axillary lymph nodes negative  Upbeat clinical trial: No adverse effects from participating in the trial Lynch syndrome positive on genetic testing (I discussed with her about aspirin 600 mg for prevention of colon cancer risk: Study published in [name]) ---------------------------------------------------------------------------------------------------------------------------------------- Depression/anxiety/panic attacks:On Wellbutrin Chemo-induced peripheral neuropathy:No further problems  Current treatment: Herceptin maintenance subcutaneously until 10/20/2019. Herceptin subcu toxicity: Injection site discomfort and bruising   We will need to consult Dr. Denman George for hysterectomy. Return to clinic in 6 months for follow-up.

## 2019-10-04 ENCOUNTER — Telehealth: Payer: Self-pay | Admitting: Hematology and Oncology

## 2019-10-04 NOTE — Telephone Encounter (Signed)
Scheduled per los, patient has been called and notified. 

## 2019-10-10 ENCOUNTER — Other Ambulatory Visit: Payer: Self-pay

## 2019-10-10 ENCOUNTER — Telehealth: Payer: Self-pay

## 2019-10-10 ENCOUNTER — Telehealth: Payer: Self-pay | Admitting: *Deleted

## 2019-10-10 DIAGNOSIS — C50411 Malignant neoplasm of upper-outer quadrant of right female breast: Secondary | ICD-10-CM

## 2019-10-10 DIAGNOSIS — Z171 Estrogen receptor negative status [ER-]: Secondary | ICD-10-CM

## 2019-10-10 NOTE — Telephone Encounter (Signed)
UPBEAT [id number] - UNDERSTANDING and PREDICTING BREAST CANCER EVENTS AFTER TREATMENT  10/10/2019 1510PM  Spoke with [name] approximately five minutes to introduce myself and schedule her 22-month UPBEAT visit. Harper verbalizes continued interest in participating but states, "Life is very hectic right now, can we schedule UPBEAT during my next visit?" [name] is currently scheduled for her last injection 10/20/19 and is excited to be completing treatment. I explained this upcoming UPBEAT visit includes lab specimens and the physical tests done at baseline: she verbalizes understanding and is willing to complete the assessment. The current plan is to have labs drawn first and complete physical tests after her scheduled treatment. [name] is thanked for her time and continued participation in the UPBEAT study.  Dionne Bucy. Sharlett Iles, BSN, RN, CIC 10/10/2019 3:20 PM

## 2019-10-10 NOTE — Telephone Encounter (Signed)
Called and left the patient a message to call the office back. Patient needs a new patient appt with Dr Denman George

## 2019-10-11 ENCOUNTER — Telehealth: Payer: Self-pay | Admitting: *Deleted

## 2019-10-11 NOTE — Telephone Encounter (Signed)
Called and scheduled the patient for an appt next week

## 2019-10-19 ENCOUNTER — Telehealth: Payer: Self-pay | Admitting: Oncology

## 2019-10-19 NOTE — Telephone Encounter (Signed)
10/19/19 - Upbeat study - Called patient to remind her to fast for 3 hours before her lab apt and to bring something with her to eat afterward.  Patient will have research labs drawn at 8:15 am. Then she will have all other research activities done after her treatment ends. Remer Macho 10/19/19 - 11:00 am

## 2019-10-20 ENCOUNTER — Encounter: Payer: Self-pay | Admitting: *Deleted

## 2019-10-20 ENCOUNTER — Inpatient Hospital Stay: Payer: BC Managed Care – PPO

## 2019-10-20 ENCOUNTER — Other Ambulatory Visit: Payer: Self-pay

## 2019-10-20 ENCOUNTER — Inpatient Hospital Stay: Payer: BC Managed Care – PPO | Attending: Hematology and Oncology

## 2019-10-20 VITALS — BP 129/82 | HR 65 | Temp 98.7°F | Resp 17

## 2019-10-20 DIAGNOSIS — C50411 Malignant neoplasm of upper-outer quadrant of right female breast: Secondary | ICD-10-CM | POA: Insufficient documentation

## 2019-10-20 DIAGNOSIS — Z9013 Acquired absence of bilateral breasts and nipples: Secondary | ICD-10-CM | POA: Diagnosis not present

## 2019-10-20 DIAGNOSIS — Z5112 Encounter for antineoplastic immunotherapy: Secondary | ICD-10-CM | POA: Diagnosis not present

## 2019-10-20 DIAGNOSIS — Z1509 Genetic susceptibility to other malignant neoplasm: Secondary | ICD-10-CM | POA: Diagnosis not present

## 2019-10-20 DIAGNOSIS — Z90722 Acquired absence of ovaries, bilateral: Secondary | ICD-10-CM | POA: Diagnosis not present

## 2019-10-20 DIAGNOSIS — Z79899 Other long term (current) drug therapy: Secondary | ICD-10-CM | POA: Diagnosis not present

## 2019-10-20 DIAGNOSIS — Z171 Estrogen receptor negative status [ER-]: Secondary | ICD-10-CM

## 2019-10-20 LAB — RESEARCH LABS

## 2019-10-20 MED ORDER — TRASTUZUMAB-HYALURONIDASE-OYSK 600-10000 MG-UNT/5ML ~~LOC~~ SOLN
600.0000 mg | Freq: Once | SUBCUTANEOUS | Status: AC
  Administered 2019-10-20: 600 mg via SUBCUTANEOUS
  Filled 2019-10-20: qty 5

## 2019-10-20 MED ORDER — DIPHENHYDRAMINE HCL 25 MG PO CAPS
ORAL_CAPSULE | ORAL | Status: AC
  Filled 2019-10-20: qty 1

## 2019-10-20 MED ORDER — DIPHENHYDRAMINE HCL 25 MG PO CAPS
25.0000 mg | ORAL_CAPSULE | Freq: Once | ORAL | Status: AC
  Administered 2019-10-20: 25 mg via ORAL

## 2019-10-20 MED ORDER — ACETAMINOPHEN 325 MG PO TABS
650.0000 mg | ORAL_TABLET | Freq: Once | ORAL | Status: AC
  Administered 2019-10-20: 650 mg via ORAL

## 2019-10-20 MED ORDER — ACETAMINOPHEN 325 MG PO TABS
ORAL_TABLET | ORAL | Status: AC
  Filled 2019-10-20: qty 2

## 2019-10-20 NOTE — Research (Signed)
UPBEAT [id number] - UNDERSTANDING and PREDICTING BREAST CANCER EVENTS AFTER TREATMENT  RESPONSE CORRECTION: On the Covid-19 questionnaire (required for the 64-month timepoint on the UPBEAT study), [name] indicated she had been tested for Covid-19 prior to her most recent surgery (January 2021) but could not remember the exact date of testing, listing "120+" as a response to the question regarding the number of days since tested. Upon chart review, it was noted that [name] was tested on 05/27/2019 which is 146 days ago. The correction was made to the calculation and the exact number of days entered as a response.  Dionne Bucy. Sharlett Iles, BSN, RN, CIC 10/20/2019 4:03 PM

## 2019-10-20 NOTE — Progress Notes (Addendum)
GYNECOLOGIC ONCOLOGY NEW PATIENT CONSULTATION   Patient Name: [name]  Patient Age: 39 y.o. Date of Service: 10/21/19 Referring Provider: Dr. [name]  Primary Care Provider: [name], [place] Consulting Provider: Jeral Pinch, MD   Assessment/Plan:  Premenopausal patient with a history of ER/PR breast cancer now status post treatment who was discovered to have Lynch syndrome with a PMS 2 mutation.  A long discussion was held with the patient regarding the rationale for prophylactic removal of the uterus.  Her cumulative lifetime risk for development of endometrial cancer is between 13 and 26% (general population 3.1%). These are usually early stage with median age of 32. Removal of the uterus eliminates the risk of uterine cancer.   While overall with Lynch syndrome, there is also an elevated risk of ovarian cancer, with a PMS2 mutation specifically, the cumulative risk for diagnosis of ovarian cancer through age 34 is between 1.3 and 3%.  This is the same or just mildly elevated from her baseline risk.  Removal of the fallopian tubes and ovaries reduces the risk of ovarian cancer dramatically but not completely as these cancers can also arise from the peritoneum.  Per NCCN guidelines, given minimal if any increased risk from baseline of ovarian cancer with a PMS2 tumor suppressor mutation, there is not sufficient evidence to make specific recommendations for risk reducing salpingo-oophorectomy at the time of risk reducing hysterectomy.  The patient feels fairly strongly about keeping her ovaries and I think that this is quite reasonable given her specific mutation.  I would recommend that we move forward with bilateral salpingectomy given the data we have that would support that the majority of ovarian cancers arise from the fallopian tube.  After this discussion, [name] would like to move forward with scheduling surgery to include total hysterectomy and bilateral salpingectomy in  September this year.  We have picked a tentative surgical date of September 27.  She will come back to see me for a preoperative visit in early September.  A copy of this note was sent to the patient's referring provider.   45 minutes of total time was spent for this patient encounter, including preparation, face-to-face counseling with the patient and coordination of care, and documentation of the encounter.  Jeral Pinch, MD  Division of Gynecologic Oncology  Department of Obstetrics and Gynecology  [facility]  ___________________________________________  Chief Complaint: Chief Complaint  Patient presents with  . Lynch syndrome    New Patient    History of Present Illness:  [name] is a 39 y.o. y.o. female who is seen in consultation at the request of Dr. Lindi Adie for an evaluation of risk reducing surgery in the setting of PMS2-related Lynch syndrome.  The patient has a history of breast cancer, ER/PR negative, as outlined below.  She finished Herceptin maintenance therapy yesterday.  She is very excited to be done with treatment.  After her Lynch syndrome diagnosis, she saw Dr. Valentino Saxon and underwent a transvaginal ultrasound.  Her.  Since.with initiation of chemotherapy and on ultrasound her lining looks thickened.  She underwent a biopsy that she reports did not show any precancer or cancer and her menses started shortly after.  She denies any history of abnormal bleeding.  She denies any pelvic or abdominal pain.  She currently lives with her 76 and 68-year-old sons part-time.  She is currently separated from her husband.  She denies any urinary symptoms and endorses chronic issues with constipation secondary to  chemotherapy.  Treatment History: Oncology History  Malignant neoplasm of upper-outer quadrant of right breast in female, estrogen receptor negative ([place])  10/11/2018 Initial Diagnosis   Patient palpated a right breast mass, mammogram  revealed 1.6 cm mass, biopsy revealed grade 3 invasive ductal carcinoma ER 0%, PR 0%, Ki-67 20%, HER-2 3+ positive, T1CN0 stage IA   10/19/2018 Cancer Staging   Staging form: Breast, AJCC 8th Edition - Clinical stage from 10/19/2018: Stage IA (cT1c, cN0, cM0, G3, ER-, PR-, HER2+) - Signed by [name], MD on 10/19/2018   10/29/2018 - 01/26/2019 Chemotherapy   The patient had trastuzumab (HERCEPTIN) 300 mg in sodium chloride 0.9 % 250 mL chemo infusion, 294 mg, Intravenous,  Once, 2 of 2 cycles Administration: 300 mg (10/29/2018), 150 mg (11/03/2018), 150 mg (12/02/2018), 150 mg (11/12/2018), 150 mg (11/25/2018), 150 mg (12/09/2018) PACLitaxel (TAXOL) 150 mg in sodium chloride 0.9 % 250 mL chemo infusion (</= 62m/m2), 80 mg/m2 = 150 mg, Intravenous,  Once, 3 of 3 cycles Dose modification: 65 mg/m2 (original dose 80 mg/m2, Cycle 3, Reason: Dose not tolerated) Administration: 150 mg (10/29/2018), 150 mg (11/03/2018), 150 mg (12/02/2018), 150 mg (11/12/2018), 150 mg (11/25/2018), 150 mg (12/09/2018), 150 mg (12/16/2018), 150 mg (12/23/2018), 120 mg (12/30/2018), 120 mg (01/06/2019), 120 mg (01/13/2019), 120 mg (01/20/2019) trastuzumab-dkst (OGIVRI) 150 mg in sodium chloride 0.9 % 250 mL chemo infusion, 150 mg (100 % of original dose 150 mg), Intravenous,  Once, 2 of 2 cycles Dose modification: 150 mg (original dose 150 mg, Cycle 2, Reason: Other (see comments), Comment: Biosimilar Conversion), 450 mg (original dose 150 mg, Cycle 3, Reason: Other (see comments), Comment: Biosimilar Conversion) Administration: 150 mg (12/16/2018), 150 mg (12/23/2018), 150 mg (12/30/2018), 150 mg (01/06/2019), 150 mg (01/13/2019), 450 mg (01/20/2019)  for chemotherapy treatment.    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  The Common Hereditary Gene Panel offered by [facility] includes sequencing and/or deletion duplication testing of the following 48 genes: APC, ATM,  AXIN2, BARD1, BMPR1A, BRCA1, BRCA2, BRIP1, CDH1, CDK4, CDKN2A (p14ARF), CDKN2A (p16INK4a), CHEK2, CTNNA1, DICER1, EPCAM (Deletion/duplication testing only), GREM1 (promoter region deletion/duplication testing only), KIT, MEN1, MLH1, MSH2, MSH3, MSH6, MUTYH, NBN, NF1, NHTL1, PALB2, PDGFRA, PMS2, POLD1, POLE, PTEN, RAD50, RAD51C, RAD51D, RNF43, SDHB, SDHC, SDHD, SMAD4, SMARCA4. STK11, TP53, TSC1, TSC2, and VHL.  The following genes were evaluated for sequence changes only: SDHA and HOXB13 c.251G>A variant only.   Two VUS's also found - one in ATM c.133C>T and the other in POLE c.1280C>T.  These will not affect medical management. The report date is November 12, 2018.   02/10/2019 -  Chemotherapy   The patient had trastuzumab-hyaluronidase-oysk (HERCEPTIN HYLECTA) 600-10000 MG-UNT/5ML chemo SQ injection 600 mg, 600 mg, Subcutaneous,  Once, 7 of 7 cycles Administration: 600 mg (06/16/2019), 600 mg (07/07/2019), 600 mg (07/28/2019), 600 mg (08/18/2019), 600 mg (09/08/2019), 600 mg (09/29/2019), 600 mg (10/20/2019) trastuzumab-dkst (OGIVRI) 462 mg in sodium chloride 0.9 % 250 mL chemo infusion, 6 mg/kg = 630 mg, Intravenous,  Once, 6 of 6 cycles Administration: 450 mg (03/03/2019), 450 mg (05/05/2019), 450 mg (05/26/2019), 450 mg (03/24/2019), 450 mg (04/14/2019)  for chemotherapy treatment.    02/22/2019 Surgery   Bilateral mastectomies ([name]) Left breast: benign tissue and no evidence of malignancy Right breast: no residual carcinoma, 2 right axillary lymph nodes negative     PAST MEDICAL HISTORY:  Past Medical History:  Diagnosis Date  . Anxiety  over cancer diagnosis  . Arthritis   . Breast cancer ([place])    right breast cancer chemo finished per pt  . Family history of breast cancer   . Lynch syndrome   . Scleroderma ([place])      PAST SURGICAL HISTORY:  Past Surgical History:  Procedure Laterality Date  . BREAST RECONSTRUCTION WITH PLACEMENT OF TISSUE EXPANDER AND FLEX HD (ACELLULAR  HYDRATED DERMIS) Bilateral 02/22/2019   Procedure: BILATERAL BREAST RECONSTRUCTION WITH PLACEMENT OF TISSUE EXPANDER AND FLEX HD (ACELLULAR HYDRATED DERMIS);  Surgeon: Irene Limbo, MD;  Location: [place];  Service: Plastics;  Laterality: Bilateral;  . CERVICAL FUSION  2019  . CESAREAN SECTION  2005  . CESAREAN SECTION  02/27/2011   Procedure: CESAREAN SECTION;  Surgeon: Shon Millet II;  Location: [facility];  Service: Gynecology;  Laterality: N/A;  repeat  . LIPOSUCTION WITH LIPOFILLING Bilateral 05/31/2019   Procedure: LIPOFILLING FROM ABDOMEN TO BILATERAL CHEST;  Surgeon: Irene Limbo, MD;  Location: [facility];  Service: Plastics;  Laterality: Bilateral;  . NIPPLE SPARING MASTECTOMY WITH SENTINEL LYMPH NODE BIOPSY Bilateral 02/22/2019   Procedure: RIGHT NIPPLE SPARING MASTECTOMY AND LEFT RISK REDUCING NIPPLE SPARING MASTECTOMY WITH RIGHT AXILLARY SENTINEL LYMPH NODE BIOPSY;  Surgeon: Rolm Bookbinder, MD;  Location: [place];  Service: General;  Laterality: Bilateral;  BILATERAL PECTORAL BLOCK  . PORT-A-CATH REMOVAL Right 05/31/2019   Procedure: REMOVAL PORT-A-CATH;  Surgeon: Irene Limbo, MD;  Location: [facility];  Service: Plastics;  Laterality: Right;  . PORTACATH PLACEMENT Right 10/20/2018   Procedure: INSERTION PORT-A-CATH WITH ULTRASOUND;  Surgeon: Rolm Bookbinder, MD;  Location: [facility];  Service: General;  Laterality: Right;  . REMOVAL OF BILATERAL TISSUE EXPANDERS WITH PLACEMENT OF BILATERAL BREAST IMPLANTS Bilateral 05/31/2019   Procedure: REMOVAL OF BILATERAL TISSUE EXPANDERS WITH PLACEMENT OF SILICONE BILATERAL BREAST IMPLANTS;  Surgeon: Irene Limbo, MD;  Location: [facility];  Service: Plastics;  Laterality: Bilateral;    OB/GYN HISTORY:  OB History  Gravida Para Term Preterm AB Living  '2 2 2     2  ' SAB TAB Ectopic Multiple Live Births          1    #  Outcome Date GA Lbr Len/2nd Weight Sex Delivery Anes PTL Lv  2 Term 02/27/11 [redacted]w[redacted]d 9 lb 1.2 oz (4.115 kg) M CS-LTranv Spinal  LIV  1 Term             No LMP recorded.  Age at menarche: 89 Age at menopause: n/a Hx of HRT: no Hx of STDs: no Last pap: 02/2019 History of abnormal pap smears: no Has been on OCPs for a total of 20+ years  SCREENING STUDIES:  Last mammogram: 2020  Last colonoscopy: 02/2019  MEDICATIONS: Outpatient Encounter Medications as of 10/21/2019  Medication Sig  . buPROPion (WELLBUTRIN XL) 150 MG 24 hr tablet Take 1 tablet (150 mg total) by mouth daily.   No facility-administered encounter medications on file as of 10/21/2019.    ALLERGIES:  Allergies  Allergen Reactions  . Methocarbamol Itching     FAMILY HISTORY:  Family History  Problem Relation Age of Onset  . Breast cancer Other   . Breast cancer Other   . Hashimoto's thyroiditis Son   . Ulcerative colitis Niece   . Ulcerative colitis Mother   . Diabetes Mother   . Colon polyps Father   . Diabetes Father   . Rheum arthritis Maternal  Grandmother   . Heart disease Maternal Grandfather   . Breast cancer Other        paternal great grandmother  . Colon cancer Cousin        '@nd'  cousin  . Esophageal cancer Neg Hx   . Rectal cancer Neg Hx   . Stomach cancer Neg Hx      SOCIAL HISTORY:    Social Connections:   . Frequency of Communication with Friends and Family:   . Frequency of Social Gatherings with Friends and Family:   . Attends Religious Services:   . Active Member of Clubs or Organizations:   . Attends Archivist Meetings:   Marland Kitchen Marital Status:     REVIEW OF SYSTEMS:  + constipation secondary to chemotherapy, anxiety Denies appetite changes, fevers, chills, fatigue, unexplained weight changes. Denies hearing loss, neck lumps or masses, mouth sores, ringing in ears or voice changes. Denies cough or wheezing.  Denies shortness of breath. Denies chest pain or  palpitations. Denies leg swelling. Denies abdominal distention, pain, blood in stools, diarrhea, nausea, vomiting, or early satiety. Denies pain with intercourse, dysuria, frequency, hematuria or incontinence. Denies hot flashes, pelvic pain, vaginal bleeding or vaginal discharge.   Denies joint pain, back pain or muscle pain/cramps. Denies itching, rash, or wounds. Denies dizziness, headaches, numbness or seizures. Denies swollen lymph nodes or glands, denies easy bruising or bleeding. Denies depression, confusion, or decreased concentration.  Physical Exam:  Vital Signs for this encounter:  Blood pressure 125/89, pulse 63, temperature 98.9 F (37.2 C), temperature source Oral, resp. rate 16, height '5\' 3"'  (1.6 m), weight 169 lb 6 oz (76.8 kg), SpO2 99 %. Body mass index is 30 kg/m. General: Alert, oriented, no acute distress.  HEENT: Normocephalic, atraumatic. Sclera anicteric.  Chest: Clear to auscultation bilaterally. No wheezes, rhonchi, or rales. Cardiovascular: Regular rate and rhythm, no murmurs, rubs, or gallops.  Extremities: Grossly normal range of motion. Warm, well perfused. No edema bilaterally.  Skin: No rashes or lesions.  Lymphatics: No cervical, supraclavicular adenopathy.  GU: Deferred.  LABORATORY AND RADIOLOGIC DATA:  Outside medical records were reviewed to synthesize the above history, along with the history and physical obtained during the visit.   Lab Results  Component Value Date   WBC 5.8 07/07/2019   HGB 13.2 07/07/2019   HCT 39.7 07/07/2019   PLT 225 07/07/2019   GLUCOSE 97 07/07/2019   ALT 10 07/07/2019   AST 11 (L) 07/07/2019   NA 143 07/07/2019   K 4.6 07/07/2019   CL 108 07/07/2019   CREATININE 0.88 07/07/2019   BUN 18 07/07/2019   CO2 26 07/07/2019

## 2019-10-20 NOTE — Patient Instructions (Signed)
Trastuzumab; Hyaluronidase injection What is this medicine? TRASTUZUMAB; HYALURONIDASE (tras TOO zoo mab / hye al ur ON i dase) is used to treat breast cancer and stomach cancer. Trastuzumab is a monoclonal antibody. Hyaluronidase is used to improve the effects of trastuzumab. This medicine may be used for other purposes; ask your health care provider or pharmacist if you have questions. COMMON BRAND NAME(S): HERCEPTIN HYLECTA What should I tell my health care provider before I take this medicine? They need to know if you have any of these conditions:  heart disease  heart failure  lung or breathing disease, like asthma  an unusual or allergic reaction to trastuzumab, or other medications, foods, dyes, or preservatives  pregnant or trying to get pregnant  breast-feeding How should I use this medicine? This medicine is for injection under the skin. It is given by a health care professional in a hospital or clinic setting. Talk to your pediatrician regarding the use of this medicine in children. This medicine is not approved for use in children. Overdosage: If you think you have taken too much of this medicine contact a poison control center or emergency room at once. NOTE: This medicine is only for you. Do not share this medicine with others. What if I miss a dose? It is important not to miss a dose. Call your doctor or health care professional if you are unable to keep an appointment. What may interact with this medicine? This medicine may interact with the following medications:  certain types of chemotherapy, such as daunorubicin, doxorubicin, epirubicin, and idarubicin This list may not describe all possible interactions. Give your health care provider a list of all the medicines, herbs, non-prescription drugs, or dietary supplements you use. Also tell them if you smoke, drink alcohol, or use illegal drugs. Some items may interact with your medicine. What should I watch for while  using this medicine? Visit your doctor for checks on your progress. Report any side effects. Continue your course of treatment even though you feel ill unless your doctor tells you to stop. Call your doctor or health care professional for advice if you get a fever, chills or sore throat, or other symptoms of a cold or flu. Do not treat yourself. Try to avoid being around people who are sick. You may experience fever, chills and shaking during your first infusion. These effects are usually mild and can be treated with other medicines. Report any side effects during the infusion to your health care professional. Fever and chills usually do not happen with later infusions. Do not become pregnant while taking this medicine or for 7 months after stopping it. Women should inform their doctor if they wish to become pregnant or think they might be pregnant. Women of child-bearing potential will need to have a negative pregnancy test before starting this medicine. There is a potential for serious side effects to an unborn child. Talk to your health care professional or pharmacist for more information. Do not breast-feed an infant while taking this medicine or for 7 months after stopping it. What side effects may I notice from receiving this medicine? Side effects that you should report to your doctor or health care professional as soon as possible:  allergic reactions like skin rash, itching or hives, swelling of the face, lips, or tongue  breathing problems  chest pain or palpitations  cough  fever  general ill feeling or flu-like symptoms  signs of worsening heart failure like breathing problems; swelling in your legs and   feet Side effects that usually do not require medical attention (report these to your doctor or health care professional if they continue or are bothersome):  bone pain  changes in taste  diarrhea  joint pain  nausea/vomiting  unusually weak or tired  weight loss This  list may not describe all possible side effects. Call your doctor for medical advice about side effects. You may report side effects to FDA at 1-800-FDA-1088. Where should I keep my medicine? This drug is given in a hospital or clinic and will not be stored at home. NOTE: This sheet is a summary. It may not cover all possible information. If you have questions about this medicine, talk to your doctor, pharmacist, or health care provider.  2020 Elsevier/Gold Standard (2017-07-10 21:54:17)  

## 2019-10-20 NOTE — Research (Signed)
UPBEAT [id number] - UNDERSTANDING and PREDICTING BREAST CANCER EVENTS AFTER TREATMENT  10/20/2019 (60-month visit)  [name] arrives to the cancer center today for her 57-month UPBEAT study visit and her final Herceptin injection. Per Arlene's request, labs were collected upon arrival and UPBEAT physical function tests performed following her injection rather than before.  8:11AM LABS: Labs collected per protocol for the 43-month timepoint: Suprena tolerated well without complaint.  8:30AM QUESTIONNAIRES: Lab was able to accommodate [place] immediately upon arrival; so, study questionnaires were provided for completion following lab collection while she waited in the infusion area for her injection. Shalonda was able to complete all questionnaires without difficulty; one correction was initialed by [name] as her own correction for clarity.   8:45AM INFUSION: Keidy received her last Herceptin injection today and she is very excited this is her final shot!  9:00AM VITAL SIGNS: Following her injection, vital signs were collected following the UPBEAT protocol by this nurse, including height and weight. Moriah was seated comfortably for five minutes prior to acquisition with a one-minute interval between the readings.   9:15AM NEUROCOGNITIVE TESTS: After vital signs, neurocognitive tests were completed by trained [place], [name] in the Research Audit Room inside the [facility] department. [name] Smith Leisure centre manager) observed the testing.  9:30AM MEDS/CARDIOVASCULAR EVENT/EMAIL REVIEW: After the neurocognitive tests, Stephane had the opportunity to review her current medication list and verifies she currently takes the same one medication with no changes to her current list. Regarding cardiovascular events, Dia denies any CV events since her last visit. She did have one overnight stay at the [facility] following her mastectomy in October.  Her final breast surgery in January 2021 was an outpatient procedure and did not require hospitalization. Danica was questioned about receiving study surveys via email for her remaining visits but does not wish to receive questionnaires via email at this time.  9:45AM PHYSICAL ASSESSMENT: The remaining study physical assessments were completed per protocol with [name] as a scribe; Isebella was able to perform all assessments independently and without difficulty or complaint.  10:15AM GIFT CARD: Upon completion of all 18-month activities, a $25 gift card was provided for her participation.  10:20AM FUTURE VISITS: Tanysha was advised that her next visit (8-month timepoint) is due at this same time next year and includes a final cardiac MRI exam. She verbalizes understanding and verbalizes interest in continued participation in the UPBEAT study. Micheala requests that she be contacted by mobile phone number (listed in her demographics) since she is currently in the process of moving.  Twylia was escorted to the main entrance and thanked for her time and continued participation in the UPBEAT study. A business card with direct contact information was provided and she is encouraged to contact me with any questions or concerns: she verbalizes understanding.  Dionne Bucy. Sharlett Iles, BSN, RN, CIC 10/20/2019 11:39 AM

## 2019-10-21 ENCOUNTER — Encounter: Payer: Self-pay | Admitting: Gynecologic Oncology

## 2019-10-21 ENCOUNTER — Inpatient Hospital Stay (HOSPITAL_BASED_OUTPATIENT_CLINIC_OR_DEPARTMENT_OTHER): Payer: BC Managed Care – PPO | Admitting: Gynecologic Oncology

## 2019-10-21 ENCOUNTER — Other Ambulatory Visit: Payer: Self-pay

## 2019-10-21 ENCOUNTER — Other Ambulatory Visit: Payer: Self-pay | Admitting: Gynecologic Oncology

## 2019-10-21 VITALS — BP 125/89 | HR 63 | Temp 98.9°F | Resp 16 | Ht 63.0 in | Wt 169.4 lb

## 2019-10-21 DIAGNOSIS — C50411 Malignant neoplasm of upper-outer quadrant of right female breast: Secondary | ICD-10-CM

## 2019-10-21 DIAGNOSIS — Z1509 Genetic susceptibility to other malignant neoplasm: Secondary | ICD-10-CM | POA: Diagnosis not present

## 2019-10-21 DIAGNOSIS — Z90722 Acquired absence of ovaries, bilateral: Secondary | ICD-10-CM | POA: Diagnosis not present

## 2019-10-21 DIAGNOSIS — Z5112 Encounter for antineoplastic immunotherapy: Secondary | ICD-10-CM | POA: Diagnosis not present

## 2019-10-21 DIAGNOSIS — Z171 Estrogen receptor negative status [ER-]: Secondary | ICD-10-CM | POA: Diagnosis not present

## 2019-10-21 DIAGNOSIS — Z79899 Other long term (current) drug therapy: Secondary | ICD-10-CM | POA: Diagnosis not present

## 2019-10-21 DIAGNOSIS — Z9013 Acquired absence of bilateral breasts and nipples: Secondary | ICD-10-CM | POA: Diagnosis not present

## 2019-10-21 NOTE — Patient Instructions (Signed)
It was a pleasure meeting you today.  I will see you in early September for a preop visit and surgery date will be 9/27 at the outpatient surgical center.  If you have any issues before that, please call me at clinic at [phone].

## 2019-10-24 DIAGNOSIS — Z171 Estrogen receptor negative status [ER-]: Secondary | ICD-10-CM

## 2019-10-24 DIAGNOSIS — C50411 Malignant neoplasm of upper-outer quadrant of right female breast: Secondary | ICD-10-CM

## 2019-10-24 NOTE — Research (Signed)
UPBEAT [id number] - UNDERSTANDING and PREDICTING BREAST CANCER EVENTS AFTER TREATMENT  10/24/2019 14:25PM  TELEPHONE CALL RE: LABS: UPBEAT lab results received: triglycerides are above normal. Reviewed by Dr. Lindi Adie: non-critical. Outgoing T[facility] to [facility]: verified that it was Sabra that I was speaking with. Notified her of triglyceride value, she states, "Actually, it's better than it was before my cancer!" Advised that we will send a copy of her results to her: she verbalizes understanding and appreciation for the call. Will provide a copy to research specialist Remer Macho along with UPBEAT lab value letter for mailing.  Dionne Bucy. Sharlett Iles, BSN, RN, CIC 10/24/2019 4:33 PM

## 2019-11-01 ENCOUNTER — Telehealth: Payer: Self-pay

## 2019-11-01 NOTE — Telephone Encounter (Signed)
Attempted to contact patient to discuss moving surgery date. LVM to call back.

## 2019-11-09 ENCOUNTER — Telehealth: Payer: Self-pay | Admitting: *Deleted

## 2019-11-09 NOTE — Telephone Encounter (Signed)
Called and left the patient a message to call the office back. Need to move her pre op appt from [facility] on 8/27 to Dr Berline Lopes on 8/16

## 2019-11-11 DIAGNOSIS — Z113 Encounter for screening for infections with a predominantly sexual mode of transmission: Secondary | ICD-10-CM | POA: Diagnosis not present

## 2019-11-14 DIAGNOSIS — H1013 Acute atopic conjunctivitis, bilateral: Secondary | ICD-10-CM | POA: Diagnosis not present

## 2019-12-02 ENCOUNTER — Telehealth: Payer: Self-pay | Admitting: Hematology and Oncology

## 2019-12-02 ENCOUNTER — Encounter: Payer: Self-pay | Admitting: Hematology and Oncology

## 2019-12-02 NOTE — Telephone Encounter (Signed)
[name] call me regarding palpitations that she had last night and severe fatigue.  She thought the palpitations may have been slightly irregular. This morning it is now regular and she is feeling significantly better after some sleep. We discussed whether or not she needs to go to the emergency room for an evaluation. She was able to check her pulse again and felt it was regular at 78. I encouraged her to keep an eye on this and if the palpitations recur and they are irregular she will go to the ED. She has been going through a tough divorce and lots of emotional ups and downs are happening.  This is the reason for her palpitations.

## 2019-12-19 ENCOUNTER — Encounter: Payer: Self-pay | Admitting: Gynecologic Oncology

## 2019-12-19 ENCOUNTER — Other Ambulatory Visit: Payer: Self-pay

## 2019-12-19 ENCOUNTER — Inpatient Hospital Stay: Payer: BC Managed Care – PPO | Attending: Hematology and Oncology | Admitting: Gynecologic Oncology

## 2019-12-19 VITALS — BP 131/77 | HR 77 | Temp 98.4°F | Resp 17 | Ht 63.0 in | Wt 166.5 lb

## 2019-12-19 DIAGNOSIS — Z803 Family history of malignant neoplasm of breast: Secondary | ICD-10-CM | POA: Diagnosis not present

## 2019-12-19 DIAGNOSIS — Z1509 Genetic susceptibility to other malignant neoplasm: Secondary | ICD-10-CM

## 2019-12-19 DIAGNOSIS — Z1502 Genetic susceptibility to malignant neoplasm of ovary: Secondary | ICD-10-CM

## 2019-12-19 DIAGNOSIS — C50411 Malignant neoplasm of upper-outer quadrant of right female breast: Secondary | ICD-10-CM

## 2019-12-19 MED ORDER — SENNOSIDES-DOCUSATE SODIUM 8.6-50 MG PO TABS: 2.0000 | ORAL_TABLET | Freq: Every day | ORAL | 0 refills | Status: DC

## 2019-12-19 MED ORDER — OXYCODONE HCL 5 MG PO TABS: 5.0000 mg | ORAL_TABLET | ORAL | 0 refills | Status: DC | PRN

## 2019-12-19 MED ORDER — IBUPROFEN 800 MG PO TABS: 800.0000 mg | ORAL_TABLET | Freq: Three times a day (TID) | ORAL | 0 refills | Status: DC | PRN

## 2019-12-19 NOTE — Patient Instructions (Signed)
Preparing for your Surgery  Plan for surgery on January 04, 2020 with Dr. Jeral Pinch at [facility]. You will be scheduled for a robotic assisted total laparoscopic hysterectomy (removal of uterus and cervix), bilateral salpingectomy (removal of both fallopian tubes).   Pre-operative Testing -You will receive a phone call from presurgical testing at [facility] to arrange for a pre-operative appointment over the phone, lab appointment, and COVID test. The COVID test normally happens 3 days prior to the surgery and they ask that you self quarantine after the test up until surgery to decrease chance of exposure.  -Bring your insurance card, copy of an advanced directive if applicable, medication list  -At that visit, you will be asked to sign a consent for a possible blood transfusion in case a transfusion becomes necessary during surgery.  The need for a blood transfusion is rare but having consent is a necessary part of your care.     -You should not be taking blood thinners or aspirin at least ten days prior to surgery unless instructed by your surgeon.  -Do not take supplements such as fish oil (omega 3), red yeast rice, turmeric before your surgery.   Day Before Surgery at [facility] will be asked to take in a light diet the day before surgery. You will be advised you can have clear liquids up until 3 hours before your surgery.    Eat a light diet the day before surgery.  Examples including soups, broths, toast, yogurt, mashed potatoes.  AVOID GAS PRODUCING FOODS. Things to avoid include carbonated beverages (fizzy beverages), raw fruits and raw vegetables, or beans.   If your bowels are filled with gas, your surgeon will have difficulty visualizing your pelvic organs which increases your surgical risks.  Your role in recovery Your role is to become active as soon as directed by your doctor, while still giving yourself time to heal.  Rest when you feel  tired. You will be asked to do the following in order to speed your recovery:  - Cough and breathe deeply. This helps to clear and expand your lungs and can prevent pneumonia after surgery.  - [place]. Do mild physical activity. Walking or moving your legs help your circulation and body functions return to normal. Do not try to get up or walk alone the first time after surgery.   -If you develop swelling on one leg or the other, pain in the back of your leg, redness/warmth in one of your legs, please call the office or go to the Emergency Room to have a doppler to rule out a blood clot. For shortness of breath, chest pain-seek care in the Emergency Room as soon as possible. - Actively manage your pain. Managing your pain lets you move in comfort. We will ask you to rate your pain on a scale of zero to 10. It is your responsibility to tell your doctor or nurse where and how much you hurt so your pain can be treated.  Special Considerations -Your final pathology results from surgery should be available around one week after surgery and the results will be relayed to you when available.  -FMLA forms can be faxed to [phone] and please allow 5-7 business days for completion.  Pain Management After Surgery -You have been prescribed your pain medication and bowel regimen medications before surgery so that you can have these available when you are discharged from the hospital. The pain medication  is for use ONLY AFTER surgery and a new prescription will not be given.   -Make sure that you have Tylenol and Ibuprofen at home to use on a regular basis after surgery for pain control. We recommend alternating the medications every hour to six hours since they work differently and are processed in the body differently for pain relief.  -Review the attached handout on narcotic use and their risks and side effects.   Bowel Regimen -You have been prescribed Sennakot-S to take nightly to  prevent constipation especially if you are taking the narcotic pain medication intermittently.  It is important to prevent constipation and drink adequate amounts of liquids. You can stop taking this medication when you are not taking pain medication and you are back on your normal bowel routine.  Risks of Surgery Risks of surgery are low but include bleeding, infection, damage to surrounding structures, re-operation, blood clots, and very rarely death.   Blood Transfusion Information (For the consent to be signed before surgery)  We will be checking your blood type before surgery so in case of emergencies, we will know what type of blood you would need.                                            WHAT IS A BLOOD TRANSFUSION?  A transfusion is the replacement of blood or some of its parts. Blood is made up of multiple cells which provide different functions.  Red blood cells carry oxygen and are used for blood loss replacement.  White blood cells fight against infection.  Platelets control bleeding.  Plasma helps clot blood.  Other blood products are available for specialized needs, such as hemophilia or other clotting disorders. BEFORE THE TRANSFUSION  Who gives blood for transfusions?   You may be able to donate blood to be used at a later date on yourself (autologous donation).  Relatives can be asked to donate blood. This is generally not any safer than if you have received blood from a stranger. The same precautions are taken to ensure safety when a relative's blood is donated.  Healthy volunteers who are fully evaluated to make sure their blood is safe. This is blood bank blood. Transfusion therapy is the safest it has ever been in the practice of medicine. Before blood is taken from a donor, a complete history is taken to make sure that person has no history of diseases nor engages in risky social behavior (examples are intravenous drug use or sexual activity with multiple  partners). The donor's travel history is screened to minimize risk of transmitting infections, such as malaria. The donated blood is tested for signs of infectious diseases, such as HIV and hepatitis. The blood is then tested to be sure it is compatible with you in order to minimize the chance of a transfusion reaction. If you or a relative donates blood, this is often done in anticipation of surgery and is not appropriate for emergency situations. It takes many days to process the donated blood. RISKS AND COMPLICATIONS Although transfusion therapy is very safe and saves many lives, the main dangers of transfusion include:   Getting an infectious disease.  Developing a transfusion reaction. This is an allergic reaction to something in the blood you were given. Every precaution is taken to prevent this. The decision to have a blood transfusion has been considered carefully by  your caregiver before blood is given. Blood is not given unless the benefits outweigh the risks.  AFTER SURGERY INSTRUCTIONS  Return to work: 4-6 weeks if applicable  Activity: 1. Be up and out of the bed during the day.  Take a nap if needed.  You may walk up steps but be careful and use the hand rail.  Stair climbing will tire you more than you think, you may need to stop part way and rest.   2. No lifting or straining for 6 weeks over 10 pounds. No pushing, pulling, straining for 6 weeks.  3. No driving for 1 week(s).  Do not drive if you are taking narcotic pain medicine and make sure that your reaction time has returned.   4. You can shower as soon as the next day after surgery. Shower daily.  Use your regular soap and water (not directly on the incision) and pat your incision(s) dry afterwards; don't rub.  No tub baths or submerging your body in water until cleared by your surgeon. If you have the soap that was given to you by pre-surgical testing that was used before surgery, you do not need to use it afterwards  because this can irritate your incisions.   5. No sexual activity and nothing in the vagina for 8 weeks.  6. You may experience a small amount of clear drainage from your incisions, which is normal.  If the drainage persists, increases, or changes color please call the office.  7. Do not use creams, lotions, or ointments such as neosporin on your incisions after surgery until advised by your surgeon because they can cause removal of the dermabond glue on your incisions.    8. You may experience vaginal spotting after surgery or around the 6-8 week mark from surgery when the stitches at the top of the vagina begin to dissolve.  The spotting is normal but if you experience heavy bleeding, call our office.  9. Take Tylenol or ibuprofen first for pain and only use narcotic pain medication for severe pain not relieved by the Tylenol or Ibuprofen.  Monitor your Tylenol intake to a max of 4,000 mg in a 24 hour period. You can alternate these medications after surgery.  Diet: 1. Low sodium Heart Healthy Diet is recommended but you are cleared to resume your normal (before surgery) diet after your procedure.  2. It is safe to use a laxative, such as Miralax or Colace, if you have difficulty moving your bowels. You have been prescribed Sennakot at bedtime every evening to keep bowel movements regular and to prevent constipation.    Wound Care: 1. Keep clean and dry.  Shower daily.  Reasons to call the Doctor:  Fever - Oral temperature greater than 100.4 degrees Fahrenheit  Foul-smelling vaginal discharge  Difficulty urinating  Nausea and vomiting  Increased pain at the site of the incision that is unrelieved with pain medicine.  Difficulty breathing with or without chest pain  New calf pain especially if only on one side  Sudden, continuing increased vaginal bleeding with or without clots.   Contacts: For questions or concerns you should contact:  Dr. Jeral Pinch at  [phone]  [name], NP at [phone]  After Hours: call [phone] and have the GYN Oncologist paged/contacted

## 2019-12-19 NOTE — H&P (View-Only) (Signed)
Gynecologic Oncology Return Clinic Visit  12/19/19  Reason for Visit: pre-operative visit and discussion regarding risk reducing surgery in the setting of Lynch syndrome  Treatment History: Oncology History  Malignant neoplasm of upper-outer quadrant of right breast in female, estrogen receptor negative ([place])  10/11/2018 Initial Diagnosis   Patient palpated a right breast mass, mammogram revealed 1.6 cm mass, biopsy revealed grade 3 invasive ductal carcinoma ER 0%, PR 0%, Ki-67 20%, HER-2 3+ positive, T1CN0 stage IA   10/19/2018 Cancer Staging   Staging form: Breast, AJCC 8th Edition - Clinical stage from 10/19/2018: Stage IA (cT1c, cN0, cM0, G3, ER-, PR-, HER2+) - Signed by [name], MD on 10/19/2018   10/29/2018 - 01/26/2019 Chemotherapy   The patient had trastuzumab (HERCEPTIN) 300 mg in sodium chloride 0.9 % 250 mL chemo infusion, 294 mg, Intravenous,  Once, 2 of 2 cycles Administration: 300 mg (10/29/2018), 150 mg (11/03/2018), 150 mg (12/02/2018), 150 mg (11/12/2018), 150 mg (11/25/2018), 150 mg (12/09/2018) PACLitaxel (TAXOL) 150 mg in sodium chloride 0.9 % 250 mL chemo infusion (</= 75m/m2), 80 mg/m2 = 150 mg, Intravenous,  Once, 3 of 3 cycles Dose modification: 65 mg/m2 (original dose 80 mg/m2, Cycle 3, Reason: Dose not tolerated) Administration: 150 mg (10/29/2018), 150 mg (11/03/2018), 150 mg (12/02/2018), 150 mg (11/12/2018), 150 mg (11/25/2018), 150 mg (12/09/2018), 150 mg (12/16/2018), 150 mg (12/23/2018), 120 mg (12/30/2018), 120 mg (01/06/2019), 120 mg (01/13/2019), 120 mg (01/20/2019) trastuzumab-dkst (OGIVRI) 150 mg in sodium chloride 0.9 % 250 mL chemo infusion, 150 mg (100 % of original dose 150 mg), Intravenous,  Once, 2 of 2 cycles Dose modification: 150 mg (original dose 150 mg, Cycle 2, Reason: Other (see comments), Comment: Biosimilar Conversion), 450 mg (original dose 150 mg, Cycle 3, Reason: Other (see comments), Comment: Biosimilar Conversion) Administration: 150 mg (12/16/2018), 150 mg  (12/23/2018), 150 mg (12/30/2018), 150 mg (01/06/2019), 150 mg (01/13/2019), 450 mg (01/20/2019)  for chemotherapy treatment.    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  The Common Hereditary Gene Panel offered by [facility] includes sequencing and/or deletion duplication testing of the following 48 genes: APC, ATM, AXIN2, BARD1, BMPR1A, BRCA1, BRCA2, BRIP1, CDH1, CDK4, CDKN2A (p14ARF), CDKN2A (p16INK4a), CHEK2, CTNNA1, DICER1, EPCAM (Deletion/duplication testing only), GREM1 (promoter region deletion/duplication testing only), KIT, MEN1, MLH1, MSH2, MSH3, MSH6, MUTYH, NBN, NF1, NHTL1, PALB2, PDGFRA, PMS2, POLD1, POLE, PTEN, RAD50, RAD51C, RAD51D, RNF43, SDHB, SDHC, SDHD, SMAD4, SMARCA4. STK11, TP53, TSC1, TSC2, and VHL.  The following genes were evaluated for sequence changes only: SDHA and HOXB13 c.251G>A variant only.   Two VUS's also found - one in ATM c.133C>T and the other in POLE c.1280C>T.  These will not affect medical management. The report date is November 12, 2018.   02/10/2019 -  Chemotherapy   The patient had trastuzumab-hyaluronidase-oysk (HERCEPTIN HYLECTA) 600-10000 MG-UNT/5ML chemo SQ injection 600 mg, 600 mg, Subcutaneous,  Once, 7 of 7 cycles Administration: 600 mg (06/16/2019), 600 mg (07/07/2019), 600 mg (07/28/2019), 600 mg (08/18/2019), 600 mg (09/08/2019), 600 mg (09/29/2019), 600 mg (10/20/2019) trastuzumab-dkst (OGIVRI) 462 mg in sodium chloride 0.9 % 250 mL chemo infusion, 6 mg/kg = 630 mg, Intravenous,  Once, 6 of 6 cycles Administration: 450 mg (03/03/2019), 450 mg (05/05/2019), 450 mg (05/26/2019), 450 mg (03/24/2019), 450 mg (04/14/2019)  for chemotherapy treatment.    02/22/2019 Surgery   Bilateral mastectomies ([name]) Left breast: benign tissue and no evidence of malignancy Right breast: no residual carcinoma,  2 right axillary lymph nodes negative      Interval History: Reports overall  doing well.  Had a nice trip to the beach.  Denies any significant changes since her last visit in clinic.  Denies any significant abdominal or pelvic pain.  Reports a good appetite without nausea or emesis.  Past Medical/Surgical History: Past Medical History:  Diagnosis Date  . Anxiety    over cancer diagnosis  . Arthritis   . Breast cancer ([place])    right breast cancer chemo finished per pt  . Family history of breast cancer   . Lynch syndrome   . Scleroderma [facility])     Past Surgical History:  Procedure Laterality Date  . BREAST RECONSTRUCTION WITH PLACEMENT OF TISSUE EXPANDER AND FLEX HD (ACELLULAR HYDRATED DERMIS) Bilateral 02/22/2019   Procedure: BILATERAL BREAST RECONSTRUCTION WITH PLACEMENT OF TISSUE EXPANDER AND FLEX HD (ACELLULAR HYDRATED DERMIS);  Surgeon: Irene Limbo, MD;  Location: [place];  Service: Plastics;  Laterality: Bilateral;  . CERVICAL FUSION  2019  . CESAREAN SECTION  2005  . CESAREAN SECTION  02/27/2011   Procedure: CESAREAN SECTION;  Surgeon: Shon Millet II;  Location: [facility];  Service: Gynecology;  Laterality: N/A;  repeat  . LIPOSUCTION WITH LIPOFILLING Bilateral 05/31/2019   Procedure: LIPOFILLING FROM ABDOMEN TO BILATERAL CHEST;  Surgeon: Irene Limbo, MD;  Location: [place];  Service: Plastics;  Laterality: Bilateral;  . NIPPLE SPARING MASTECTOMY WITH SENTINEL LYMPH NODE BIOPSY Bilateral 02/22/2019   Procedure: RIGHT NIPPLE SPARING MASTECTOMY AND LEFT RISK REDUCING NIPPLE SPARING MASTECTOMY WITH RIGHT AXILLARY SENTINEL LYMPH NODE BIOPSY;  Surgeon: Rolm Bookbinder, MD;  Location: [place];  Service: General;  Laterality: Bilateral;  BILATERAL PECTORAL BLOCK  . PORT-A-CATH REMOVAL Right 05/31/2019   Procedure: REMOVAL PORT-A-CATH;  Surgeon: Irene Limbo, MD;  Location: [place];  Service: Plastics;  Laterality: Right;  . PORTACATH PLACEMENT Right 10/20/2018   Procedure:  INSERTION PORT-A-CATH WITH ULTRASOUND;  Surgeon: Rolm Bookbinder, MD;  Location: [place];  Service: General;  Laterality: Right;  . REMOVAL OF BILATERAL TISSUE EXPANDERS WITH PLACEMENT OF BILATERAL BREAST IMPLANTS Bilateral 05/31/2019   Procedure: REMOVAL OF BILATERAL TISSUE EXPANDERS WITH PLACEMENT OF SILICONE BILATERAL BREAST IMPLANTS;  Surgeon: Irene Limbo, MD;  Location: [place];  Service: Plastics;  Laterality: Bilateral;    Family History  Problem Relation Age of Onset  . Breast cancer Other   . Breast cancer Other   . Hashimoto's thyroiditis Son   . Ulcerative colitis Niece   . Ulcerative colitis Mother   . Diabetes Mother   . Colon polyps Father   . Diabetes Father   . Rheum arthritis Maternal Grandmother   . Heart disease Maternal Grandfather   . Breast cancer Other        paternal great grandmother  . Colon cancer Cousin        _0  cousin  . Esophageal cancer Neg Hx   . Rectal cancer Neg Hx   . Stomach cancer Neg Hx     Social History   Socioeconomic History  . Marital status: Married    Spouse name: Not on file  . Number of children: 2  . Years of education: Not on file  . Highest education level: Not on file  Occupational History  . Occupation: Engineer, structural  . Occupation: treatment coordinator  Tobacco Use  . Smoking status: Never Smoker  . Smokeless tobacco: Never Used  Vaping  Use  . Vaping Use: Never used  Substance and Sexual Activity  . Alcohol use: No  . Drug use: No  . Sexual activity: Yes    Birth control/protection: Surgical    Comment: husband has had vasectomy  Other Topics Concern  . Not on file  Social History Narrative  . Not on file   Social Determinants of Health   Financial Resource Strain:   . Difficulty of Paying Living Expenses:   Food Insecurity:   . Worried About Charity fundraiser in the Last Year:   . Arboriculturist in the Last Year:   Transportation Needs:   . Lexicographer (Medical):   Marland Kitchen Lack of Transportation (Non-Medical):   Physical Activity:   . Days of Exercise per Week:   . Minutes of Exercise per Session:   Stress:   . Feeling of Stress :   Social Connections:   . Frequency of Communication with Friends and Family:   . Frequency of Social Gatherings with Friends and Family:   . Attends Religious Services:   . Active Member of Clubs or Organizations:   . Attends Archivist Meetings:   Marland Kitchen Marital Status:     Current Medications:  Current Outpatient Medications:  .  buPROPion (WELLBUTRIN XL) 150 MG 24 hr tablet, Take 1 tablet (150 mg total) by mouth daily., Disp: 30 tablet, Rfl: 6 .  ibuprofen (ADVIL) 800 MG tablet, Take 1 tablet (800 mg total) by mouth every 8 (eight) hours as needed for moderate pain. For AFTER surgery, Disp: 30 tablet, Rfl: 0 .  oxyCODONE (OXY IR/ROXICODONE) 5 MG immediate release tablet, Take 1 tablet (5 mg total) by mouth every 4 (four) hours as needed for severe pain. For AFTER surgery, do not take and drive, Disp: 15 tablet, Rfl: 0 .  senna-docusate (SENOKOT-S) 8.6-50 MG tablet, Take 2 tablets by mouth at bedtime. For AFTER surgery, do not take if having diarrhea, Disp: 30 tablet, Rfl: 0  Review of Systems: Denies appetite changes, fevers, chills, fatigue, unexplained weight changes. Denies hearing loss, neck lumps or masses, mouth sores, ringing in ears or voice changes. Denies cough or wheezing.  Denies shortness of breath. Denies chest pain or palpitations. Denies leg swelling. Denies abdominal distention, pain, blood in stools, constipation, diarrhea, nausea, vomiting, or early satiety. Denies pain with intercourse, dysuria, frequency, hematuria or incontinence. Denies hot flashes, pelvic pain, vaginal bleeding or vaginal discharge.   Denies joint pain, back pain or muscle pain/cramps. Denies itching, rash, or wounds. Denies dizziness, headaches, numbness or seizures. Denies swollen lymph nodes  or glands, denies easy bruising or bleeding. Denies anxiety, depression, confusion, or decreased concentration.  Physical Exam: BP 131/77 (BP Location: Left Arm, Patient Position: Sitting)   Pulse 77   Temp 98.4 F (36.9 C) (Tympanic)   Resp 17   Ht _0  (1.6 m)   Wt 166 lb 8 oz (75.5 kg)   SpO2 100%   BMI 29.49 kg/m  General: Alert, oriented, no acute distress. HEENT: Normocephalic, atraumatic, sclera anicteric. Chest: Unlabored breathing on room air. Abdomen: soft, nontender.  Normoactive bowel sounds.  No masses or hepatosplenomegaly appreciated.  Well-healed Pfannenstiel scar. Extremities: Grossly normal range of motion.  Warm, well perfused.  No edema bilaterally. Skin: No rashes or lesions noted. GU: Normal appearing external genitalia without erythema, excoriation, or lesions.  Bimanual exam reveals small mobile uterus, no adnexal masses appreciated.    Laboratory & Radiologic Studies: None new  Assessment & Plan: [name] is a 39 y.o. woman with a history of ER/PR negative breast cancer now status post treatment who was discovered to have Lynch syndrome with a PMS2 mutation.  While overall with Lynch syndrome, there is also an elevated risk of ovarian cancer, with a PMS2 mutation specifically, the cumulative risk for diagnosis of ovarian cancer through age 49 is between 1.3 and 3%.  This is the same or just mildly elevated from her baseline risk.  Removal of the fallopian tubes and ovaries reduces the risk of ovarian cancer dramatically but not completely as these cancers can also arise from the peritoneum.  Per NCCN guidelines, given minimal if any increased risk from baseline of ovarian cancer with a PMS2 tumor suppressor mutation, there is not sufficient evidence to make specific recommendations for risk reducing salpingo-oophorectomy at the time of risk reducing hysterectomy.  The patient feels fairly strongly about keeping her ovaries and I think that this is quite  reasonable given her specific mutation.   We discussed plan for a robotic assisted hysterectomy, bilateral salpingectomy, possible laparotomy. The risks of surgery were discussed in detail and she understands these to include infection; wound separation; hernia; vaginal cuff separation, injury to adjacent organs such as bowel, bladder, blood vessels, ureters and nerves; bleeding which may require blood transfusion; anesthesia risk; thromboembolic events; possible death; unforeseen complications; possible need for re-exploration; medical complications such as heart attack, stroke, pleural effusion and pneumonia; and, if full lymphadenectomy is performed the risk of lymphedema and lymphocyst. The patient will receive DVT and antibiotic prophylaxis as indicated. She voiced a clear understanding. She had the opportunity to ask questions. Perioperative instructions were reviewed with her. Prescriptions for post-op medications were sent to her pharmacy of choice.  35 minutes of total time was spent for this patient encounter, including preparation, face-to-face counseling with the patient and coordination of care, and documentation of the encounter.  Jeral Pinch, MD  Division of Gynecologic Oncology  Department of Obstetrics and Gynecology  [facility]

## 2019-12-19 NOTE — Progress Notes (Signed)
Gynecologic Oncology Return Clinic Visit  12/19/19  Reason for Visit: pre-operative visit and discussion regarding risk reducing surgery in the setting of Lynch syndrome  Treatment History: Oncology History  Malignant neoplasm of upper-outer quadrant of right breast in female, estrogen receptor negative ([place])  10/11/2018 Initial Diagnosis   Patient palpated a right breast mass, mammogram revealed 1.6 cm mass, biopsy revealed grade 3 invasive ductal carcinoma ER 0%, PR 0%, Ki-67 20%, HER-2 3+ positive, T1CN0 stage IA   10/19/2018 Cancer Staging   Staging form: Breast, AJCC 8th Edition - Clinical stage from 10/19/2018: Stage IA (cT1c, cN0, cM0, G3, ER-, PR-, HER2+) - Signed by [name], MD on 10/19/2018   10/29/2018 - 01/26/2019 Chemotherapy   The patient had trastuzumab (HERCEPTIN) 300 mg in sodium chloride 0.9 % 250 mL chemo infusion, 294 mg, Intravenous,  Once, 2 of 2 cycles Administration: 300 mg (10/29/2018), 150 mg (11/03/2018), 150 mg (12/02/2018), 150 mg (11/12/2018), 150 mg (11/25/2018), 150 mg (12/09/2018) PACLitaxel (TAXOL) 150 mg in sodium chloride 0.9 % 250 mL chemo infusion (</= 75m/m2), 80 mg/m2 = 150 mg, Intravenous,  Once, 3 of 3 cycles Dose modification: 65 mg/m2 (original dose 80 mg/m2, Cycle 3, Reason: Dose not tolerated) Administration: 150 mg (10/29/2018), 150 mg (11/03/2018), 150 mg (12/02/2018), 150 mg (11/12/2018), 150 mg (11/25/2018), 150 mg (12/09/2018), 150 mg (12/16/2018), 150 mg (12/23/2018), 120 mg (12/30/2018), 120 mg (01/06/2019), 120 mg (01/13/2019), 120 mg (01/20/2019) trastuzumab-dkst (OGIVRI) 150 mg in sodium chloride 0.9 % 250 mL chemo infusion, 150 mg (100 % of original dose 150 mg), Intravenous,  Once, 2 of 2 cycles Dose modification: 150 mg (original dose 150 mg, Cycle 2, Reason: Other (see comments), Comment: Biosimilar Conversion), 450 mg (original dose 150 mg, Cycle 3, Reason: Other (see comments), Comment: Biosimilar Conversion) Administration: 150 mg (12/16/2018), 150 mg  (12/23/2018), 150 mg (12/30/2018), 150 mg (01/06/2019), 150 mg (01/13/2019), 450 mg (01/20/2019)  for chemotherapy treatment.    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  The Common Hereditary Gene Panel offered by [facility] includes sequencing and/or deletion duplication testing of the following 48 genes: APC, ATM, AXIN2, BARD1, BMPR1A, BRCA1, BRCA2, BRIP1, CDH1, CDK4, CDKN2A (p14ARF), CDKN2A (p16INK4a), CHEK2, CTNNA1, DICER1, EPCAM (Deletion/duplication testing only), GREM1 (promoter region deletion/duplication testing only), KIT, MEN1, MLH1, MSH2, MSH3, MSH6, MUTYH, NBN, NF1, NHTL1, PALB2, PDGFRA, PMS2, POLD1, POLE, PTEN, RAD50, RAD51C, RAD51D, RNF43, SDHB, SDHC, SDHD, SMAD4, SMARCA4. STK11, TP53, TSC1, TSC2, and VHL.  The following genes were evaluated for sequence changes only: SDHA and HOXB13 c.251G>A variant only.   Two VUS's also found - one in ATM c.133C>T and the other in POLE c.1280C>T.  These will not affect medical management. The report date is November 12, 2018.   02/10/2019 -  Chemotherapy   The patient had trastuzumab-hyaluronidase-oysk (HERCEPTIN HYLECTA) 600-10000 MG-UNT/5ML chemo SQ injection 600 mg, 600 mg, Subcutaneous,  Once, 7 of 7 cycles Administration: 600 mg (06/16/2019), 600 mg (07/07/2019), 600 mg (07/28/2019), 600 mg (08/18/2019), 600 mg (09/08/2019), 600 mg (09/29/2019), 600 mg (10/20/2019) trastuzumab-dkst (OGIVRI) 462 mg in sodium chloride 0.9 % 250 mL chemo infusion, 6 mg/kg = 630 mg, Intravenous,  Once, 6 of 6 cycles Administration: 450 mg (03/03/2019), 450 mg (05/05/2019), 450 mg (05/26/2019), 450 mg (03/24/2019), 450 mg (04/14/2019)  for chemotherapy treatment.    02/22/2019 Surgery   Bilateral mastectomies ([name]) Left breast: benign tissue and no evidence of malignancy Right breast: no residual carcinoma,  2 right axillary lymph nodes negative      Interval History: Reports overall  doing well.  Had a nice trip to the beach.  Denies any significant changes since her last visit in clinic.  Denies any significant abdominal or pelvic pain.  Reports a good appetite without nausea or emesis.  Past Medical/Surgical History: Past Medical History:  Diagnosis Date  . Anxiety    over cancer diagnosis  . Arthritis   . Breast cancer ([place])    right breast cancer chemo finished per pt  . Family history of breast cancer   . Lynch syndrome   . Scleroderma [facility])     Past Surgical History:  Procedure Laterality Date  . BREAST RECONSTRUCTION WITH PLACEMENT OF TISSUE EXPANDER AND FLEX HD (ACELLULAR HYDRATED DERMIS) Bilateral 02/22/2019   Procedure: BILATERAL BREAST RECONSTRUCTION WITH PLACEMENT OF TISSUE EXPANDER AND FLEX HD (ACELLULAR HYDRATED DERMIS);  Surgeon: Irene Limbo, MD;  Location: [place];  Service: Plastics;  Laterality: Bilateral;  . CERVICAL FUSION  2019  . CESAREAN SECTION  2005  . CESAREAN SECTION  02/27/2011   Procedure: CESAREAN SECTION;  Surgeon: Shon Millet II;  Location: [facility];  Service: Gynecology;  Laterality: N/A;  repeat  . LIPOSUCTION WITH LIPOFILLING Bilateral 05/31/2019   Procedure: LIPOFILLING FROM ABDOMEN TO BILATERAL CHEST;  Surgeon: Irene Limbo, MD;  Location: [place];  Service: Plastics;  Laterality: Bilateral;  . NIPPLE SPARING MASTECTOMY WITH SENTINEL LYMPH NODE BIOPSY Bilateral 02/22/2019   Procedure: RIGHT NIPPLE SPARING MASTECTOMY AND LEFT RISK REDUCING NIPPLE SPARING MASTECTOMY WITH RIGHT AXILLARY SENTINEL LYMPH NODE BIOPSY;  Surgeon: Rolm Bookbinder, MD;  Location: [facility];  Service: General;  Laterality: Bilateral;  BILATERAL PECTORAL BLOCK  . PORT-A-CATH REMOVAL Right 05/31/2019   Procedure: REMOVAL PORT-A-CATH;  Surgeon: Irene Limbo, MD;  Location: [facility];  Service: Plastics;  Laterality: Right;  . PORTACATH PLACEMENT Right 10/20/2018   Procedure:  INSERTION PORT-A-CATH WITH ULTRASOUND;  Surgeon: Rolm Bookbinder, MD;  Location: [place];  Service: General;  Laterality: Right;  . REMOVAL OF BILATERAL TISSUE EXPANDERS WITH PLACEMENT OF BILATERAL BREAST IMPLANTS Bilateral 05/31/2019   Procedure: REMOVAL OF BILATERAL TISSUE EXPANDERS WITH PLACEMENT OF SILICONE BILATERAL BREAST IMPLANTS;  Surgeon: Irene Limbo, MD;  Location: [place] [facility];  Service: Plastics;  Laterality: Bilateral;    Family History  Problem Relation Age of Onset  . Breast cancer Other   . Breast cancer Other   . Hashimoto's thyroiditis Son   . Ulcerative colitis Niece   . Ulcerative colitis Mother   . Diabetes Mother   . Colon polyps Father   . Diabetes Father   . Rheum arthritis Maternal Grandmother   . Heart disease Maternal Grandfather   . Breast cancer Other        paternal great grandmother  . Colon cancer Cousin        _0  cousin  . Esophageal cancer Neg Hx   . Rectal cancer Neg Hx   . Stomach cancer Neg Hx     Social History   Socioeconomic History  . Marital status: Married    Spouse name: Not on file  . Number of children: 2  . Years of education: Not on file  . Highest education level: Not on file  Occupational History  . Occupation: Engineer, structural  . Occupation: treatment coordinator  Tobacco Use  . Smoking status: Never Smoker  . Smokeless tobacco: Never Used  Vaping  Use  . Vaping Use: Never used  Substance and Sexual Activity  . Alcohol use: No  . Drug use: No  . Sexual activity: Yes    Birth control/protection: Surgical    Comment: husband has had vasectomy  Other Topics Concern  . Not on file  Social History Narrative  . Not on file   Social Determinants of Health   Financial Resource Strain:   . Difficulty of Paying Living Expenses:   Food Insecurity:   . Worried About Charity fundraiser in the Last Year:   . Arboriculturist in the Last Year:   Transportation Needs:   . Lexicographer (Medical):   Marland Kitchen Lack of Transportation (Non-Medical):   Physical Activity:   . Days of Exercise per Week:   . Minutes of Exercise per Session:   Stress:   . Feeling of Stress :   Social Connections:   . Frequency of Communication with Friends and Family:   . Frequency of Social Gatherings with Friends and Family:   . Attends Religious Services:   . Active Member of Clubs or Organizations:   . Attends Archivist Meetings:   Marland Kitchen Marital Status:     Current Medications:  Current Outpatient Medications:  .  buPROPion (WELLBUTRIN XL) 150 MG 24 hr tablet, Take 1 tablet (150 mg total) by mouth daily., Disp: 30 tablet, Rfl: 6 .  ibuprofen (ADVIL) 800 MG tablet, Take 1 tablet (800 mg total) by mouth every 8 (eight) hours as needed for moderate pain. For AFTER surgery, Disp: 30 tablet, Rfl: 0 .  oxyCODONE (OXY IR/ROXICODONE) 5 MG immediate release tablet, Take 1 tablet (5 mg total) by mouth every 4 (four) hours as needed for severe pain. For AFTER surgery, do not take and drive, Disp: 15 tablet, Rfl: 0 .  senna-docusate (SENOKOT-S) 8.6-50 MG tablet, Take 2 tablets by mouth at bedtime. For AFTER surgery, do not take if having diarrhea, Disp: 30 tablet, Rfl: 0  Review of Systems: Denies appetite changes, fevers, chills, fatigue, unexplained weight changes. Denies hearing loss, neck lumps or masses, mouth sores, ringing in ears or voice changes. Denies cough or wheezing.  Denies shortness of breath. Denies chest pain or palpitations. Denies leg swelling. Denies abdominal distention, pain, blood in stools, constipation, diarrhea, nausea, vomiting, or early satiety. Denies pain with intercourse, dysuria, frequency, hematuria or incontinence. Denies hot flashes, pelvic pain, vaginal bleeding or vaginal discharge.   Denies joint pain, back pain or muscle pain/cramps. Denies itching, rash, or wounds. Denies dizziness, headaches, numbness or seizures. Denies swollen lymph nodes  or glands, denies easy bruising or bleeding. Denies anxiety, depression, confusion, or decreased concentration.  Physical Exam: BP 131/77 (BP Location: Left Arm, Patient Position: Sitting)   Pulse 77   Temp 98.4 F (36.9 C) (Tympanic)   Resp 17   Ht _0  (1.6 m)   Wt 166 lb 8 oz (75.5 kg)   SpO2 100%   BMI 29.49 kg/m  General: Alert, oriented, no acute distress. HEENT: Normocephalic, atraumatic, sclera anicteric. Chest: Unlabored breathing on room air. Abdomen: soft, nontender.  Normoactive bowel sounds.  No masses or hepatosplenomegaly appreciated.  Well-healed Pfannenstiel scar. Extremities: Grossly normal range of motion.  Warm, well perfused.  No edema bilaterally. Skin: No rashes or lesions noted. GU: Normal appearing external genitalia without erythema, excoriation, or lesions.  Bimanual exam reveals small mobile uterus, no adnexal masses appreciated.    Laboratory & Radiologic Studies: None new  Assessment & Plan: [name] is a 39 y.o. woman with a history of ER/PR negative breast cancer now status post treatment who was discovered to have Lynch syndrome with a PMS2 mutation.  While overall with Lynch syndrome, there is also an elevated risk of ovarian cancer, with a PMS2 mutation specifically, the cumulative risk for diagnosis of ovarian cancer through age 49 is between 1.3 and 3%.  This is the same or just mildly elevated from her baseline risk.  Removal of the fallopian tubes and ovaries reduces the risk of ovarian cancer dramatically but not completely as these cancers can also arise from the peritoneum.  Per NCCN guidelines, given minimal if any increased risk from baseline of ovarian cancer with a PMS2 tumor suppressor mutation, there is not sufficient evidence to make specific recommendations for risk reducing salpingo-oophorectomy at the time of risk reducing hysterectomy.  The patient feels fairly strongly about keeping her ovaries and I think that this is quite  reasonable given her specific mutation.   We discussed plan for a robotic assisted hysterectomy, bilateral salpingectomy, possible laparotomy. The risks of surgery were discussed in detail and she understands these to include infection; wound separation; hernia; vaginal cuff separation, injury to adjacent organs such as bowel, bladder, blood vessels, ureters and nerves; bleeding which may require blood transfusion; anesthesia risk; thromboembolic events; possible death; unforeseen complications; possible need for re-exploration; medical complications such as heart attack, stroke, pleural effusion and pneumonia; and, if full lymphadenectomy is performed the risk of lymphedema and lymphocyst. The patient will receive DVT and antibiotic prophylaxis as indicated. She voiced a clear understanding. She had the opportunity to ask questions. Perioperative instructions were reviewed with her. Prescriptions for post-op medications were sent to her pharmacy of choice.  35 minutes of total time was spent for this patient encounter, including preparation, face-to-face counseling with the patient and coordination of care, and documentation of the encounter.  Jeral Pinch, MD  Division of Gynecologic Oncology  Department of Obstetrics and Gynecology  [facility]

## 2019-12-29 ENCOUNTER — Encounter: Payer: Self-pay | Admitting: Gynecologic Oncology

## 2019-12-29 NOTE — Patient Instructions (Addendum)
DUE TO COVID-19 ONLY ONE VISITOR IS ALLOWED IN WAITING ROOM (VISITOR WILL HAVE A TEMPERATURE CHECK ON ARRIVAL AND MUST WEAR A FACE MASK THE ENTIRE TIME.)  ONCE YOU ARE ADMITTED TO YOUR PRIVATE ROOM, THE SAME ONE VISITOR IS ALLOWED TO VISIT DURING VISITING HOURS ONLY.  Your COVID swab testing is scheduled for: 12/31/19 at 11:00 am , You must self quarantine after your testing per handout given to you at the testing site. [place] [address] [place], [place] [address]  (Must self quarantine after testing. Follow instructions on handout.)       Your procedure is scheduled on: 01/04/20  Report to [place] AT: 10:00  A. M.   Call this number if you have problems the morning of surgery:[phone].   OUR ADDRESS IS [address].  WE ARE LOCATED IN THE [place]                                   [facility].                                     REMEMBER:   DO NOT EAT SOLID FOOD AFTER MIDNIGHT . CLEAR LIQUIDS FROM MIDNIGHT UNTIL: 9:00 AM.    CLEAR LIQUID DIET   Foods Allowed                                                                     Foods Excluded  Coffee and tea, regular and decaf                             liquids that you cannot  Plain Jell-O any favor except red or purple                                           see through such as: Fruit ices (not with fruit pulp)                                     milk, soups, orange juice  Iced Popsicles                                    All solid food Carbonated beverages, regular and diet                                    Cranberry, grape and apple juices Sports drinks like Gatorade Lightly seasoned clear broth or consume(fat free) Sugar, honey syrup  Sample Menu Breakfast                                Lunch  Supper Cranberry juice                    Beef broth                            Chicken broth Jell-O                                     Grape juice                            Apple juice Coffee or tea                        Jell-O                                      Popsicle                                                Coffee or tea                        Coffee or tea  _____________________________________________________________________  BRUSH YOUR TEETH THE MORNING OF SURGERY.  TAKE THESE MEDICATIONS MORNING OF SURGERY WITH A SIP OF WATER:  Bupropion.  DO NOT WEAR JEWERLY, MAKE UP, OR NAIL POLISH.  DO NOT WEAR LOTIONS, POWDERS, PERFUMES/COLOGNE OR DEODORANT.  DO NOT SHAVE FOR 24 HOURS PRIOR TO DAY OF SURGERY.   CONTACTS, GLASSES, OR DENTURES MAY NOT BE WORN TO SURGERY.                                    [facility] IS NOT RESPONSIBLE  FOR ANY BELONGINGS.                                                                    [facility] - Preparing for Surgery Before surgery, you can play an important role.  Because skin is not sterile, your skin needs to be as free of germs as possible.  You can reduce the number of germs on your skin by washing with CHG (chlorahexidine gluconate) soap before surgery.  CHG is an antiseptic cleaner which kills germs and bonds with the skin to continue killing germs even after washing. Please DO NOT use if you have an allergy to CHG or antibacterial soaps.  If your skin becomes reddened/irritated stop using the CHG and inform your nurse when you arrive at Short Stay. Do not shave (including legs and underarms) for at least 48 hours prior to the first CHG shower.  You may shave your face/neck. Please follow these instructions carefully:  1.  Shower with CHG Soap the night before surgery and the  morning of Surgery.  2.  If you choose to wash  your hair, wash your hair first as usual with your  normal  shampoo.  3.  After you shampoo, rinse your hair and body thoroughly to remove the  shampoo.                           4.  Use CHG as you would any other liquid soap.  You can apply chg directly  to the skin and wash                        Gently with a scrungie or clean washcloth.  5.  Apply the CHG Soap to your body ONLY FROM THE NECK DOWN.   Do not use on face/ open                           Wound or open sores. Avoid contact with eyes, ears mouth and genitals (private parts).                       Wash face,  Genitals (private parts) with your normal soap.             6.  Wash thoroughly, paying special attention to the area where your surgery  will be performed.  7.  Thoroughly rinse your body with warm water from the neck down.  8.  DO NOT shower/wash with your normal soap after using and rinsing off  the CHG Soap.                9.  Pat yourself dry with a clean towel.            10.  Wear clean pajamas.            11.  Place clean sheets on your bed the night of your first shower and do not  sleep with pets. Day of Surgery : Do not apply any lotions/deodorants the morning of surgery.  Please wear clean clothes to the hospital/surgery center.  FAILURE TO FOLLOW THESE INSTRUCTIONS MAY RESULT IN THE CANCELLATION OF YOUR SURGERY PATIENT SIGNATURE_________________________________  NURSE SIGNATURE__________________________________  ________________________________________________________________________

## 2019-12-30 ENCOUNTER — Other Ambulatory Visit: Payer: Self-pay

## 2019-12-30 ENCOUNTER — Encounter (HOSPITAL_COMMUNITY): Payer: Self-pay

## 2019-12-30 ENCOUNTER — Encounter (HOSPITAL_COMMUNITY)
Admission: RE | Admit: 2019-12-30 | Discharge: 2019-12-30 | Disposition: A | Discharge status: Home / Self Care | Payer: BC Managed Care – PPO | Source: Ambulatory Visit | Attending: Gynecologic Oncology | Admitting: Gynecologic Oncology

## 2019-12-30 ENCOUNTER — Ambulatory Visit: Payer: BC Managed Care – PPO | Admitting: Gynecologic Oncology

## 2019-12-30 DIAGNOSIS — Z01812 Encounter for preprocedural laboratory examination: Secondary | ICD-10-CM | POA: Diagnosis not present

## 2019-12-30 HISTORY — DX: Nausea with vomiting, unspecified: R11.2

## 2019-12-30 HISTORY — DX: Other specified postprocedural states: Z98.890

## 2019-12-30 HISTORY — DX: Other complications of anesthesia, initial encounter: T88.59XA

## 2019-12-30 LAB — URINALYSIS, ROUTINE W REFLEX MICROSCOPIC
Bacteria, UA: NONE SEEN
Bilirubin Urine: NEGATIVE
Glucose, UA: NEGATIVE mg/dL
Ketones, ur: NEGATIVE mg/dL
Leukocytes,Ua: NEGATIVE
Nitrite: NEGATIVE
Protein, ur: NEGATIVE mg/dL
Specific Gravity, Urine: 1.01 (ref 1.005–1.030)
pH: 5 (ref 5.0–8.0)

## 2019-12-30 LAB — COMPREHENSIVE METABOLIC PANEL
ALT: 13 U/L (ref 0–44)
AST: 15 U/L (ref 15–41)
Albumin: 4.3 g/dL (ref 3.5–5.0)
Alkaline Phosphatase: 62 U/L (ref 38–126)
Anion gap: 9 (ref 5–15)
BUN: 14 mg/dL (ref 6–20)
CO2: 29 mmol/L (ref 22–32)
Calcium: 9.1 mg/dL (ref 8.9–10.3)
Chloride: 101 mmol/L (ref 98–111)
Creatinine, Ser: 0.81 mg/dL (ref 0.44–1.00)
GFR calc Af Amer: >60 mL/min (ref >60)
GFR calc non Af Amer: >60 mL/min (ref >60)
Glucose, Bld: 83 mg/dL (ref 70–99)
Potassium: 3.9 mmol/L (ref 3.5–5.1)
Sodium: 139 mmol/L (ref 135–145)
Total Bilirubin: 0.5 mg/dL (ref 0.3–1.2)
Total Protein: 7.4 g/dL (ref 6.5–8.1)

## 2019-12-30 LAB — CBC
HCT: 42.7 % (ref 36.0–46.0)
Hemoglobin: 14.1 g/dL (ref 12.0–15.0)
MCH: 31.6 pg (ref 26.0–34.0)
MCHC: 33 g/dL (ref 30.0–36.0)
MCV: 95.7 fL (ref 80.0–100.0)
Platelets: 260 10*3/uL (ref 150–400)
RBC: 4.46 MIL/uL (ref 3.87–5.11)
RDW: 12.6 % (ref 11.5–15.5)
WBC: 7.9 10*3/uL (ref 4.0–10.5)
nRBC: 0 % (ref 0.0–0.2)

## 2019-12-30 NOTE — Progress Notes (Addendum)
COVID Vaccine Completed: NO Date COVID Vaccine completed: COVID vaccine manufacturer: [place]   PCP -  Barth Kirks Redmon: PA Cardiologist - NO  Chest x-ray -  EKG -  Stress Test -  ECHO - 06/30/19. EPIC Cardiac Cath -   Sleep Study -  CPAP -   Fasting Blood Sugar -  Checks Blood Sugar _____ times a day  Blood Thinner Instructions: Aspirin Instructions: Last Dose:  Anesthesia review:   Patient denies shortness of breath, fever, cough and chest pain at PAT appointment   Patient verbalized understanding of instructions that were given to them at the PAT appointment. Patient was also instructed that they will need to review over the PAT instructions again at home before surgery.

## 2019-12-31 ENCOUNTER — Other Ambulatory Visit (HOSPITAL_COMMUNITY)
Admission: RE | Admit: 2019-12-31 | Discharge: 2019-12-31 | Disposition: A | Discharge status: Home / Self Care | Payer: BC Managed Care – PPO | Source: Ambulatory Visit | Attending: Gynecologic Oncology | Admitting: Gynecologic Oncology

## 2019-12-31 DIAGNOSIS — Z01812 Encounter for preprocedural laboratory examination: Secondary | ICD-10-CM | POA: Insufficient documentation

## 2019-12-31 DIAGNOSIS — Z20822 Contact with and (suspected) exposure to covid-19: Secondary | ICD-10-CM | POA: Insufficient documentation

## 2019-12-31 LAB — SARS CORONAVIRUS 2 (TAT 6-24 HRS): SARS Coronavirus 2: NEGATIVE

## 2020-01-02 ENCOUNTER — Telehealth: Payer: Self-pay

## 2020-01-02 NOTE — Telephone Encounter (Signed)
Told Ms Kyllo that the urine specimen on her pre op labs showed som blood in it. [name], NP wanted to know if she was on her period when she gave the specimen. Ms Holshouser stated that she had just finished her period 3 days prior to the sample. Denies urinary symptoms. Told her that Willis Modena, NP stated that another urine specimen will be obtained from the foley catheter when she is asleep in the OR as a double check to make sure the blood was from her period. Pt verbalized understanding.

## 2020-01-03 ENCOUNTER — Telehealth: Payer: Self-pay

## 2020-01-03 NOTE — Telephone Encounter (Signed)
[name] states that she understands her written pre op instructions and has no questions or concerns at this time.

## 2020-01-04 ENCOUNTER — Encounter (HOSPITAL_BASED_OUTPATIENT_CLINIC_OR_DEPARTMENT_OTHER)
Admission: RE | Disposition: A | Discharge status: Home / Self Care | Payer: Self-pay | Source: Home / Self Care | Attending: Gynecologic Oncology

## 2020-01-04 ENCOUNTER — Ambulatory Visit (HOSPITAL_BASED_OUTPATIENT_CLINIC_OR_DEPARTMENT_OTHER): Payer: BC Managed Care – PPO | Admitting: Anesthesiology

## 2020-01-04 ENCOUNTER — Telehealth: Payer: Self-pay | Admitting: *Deleted

## 2020-01-04 ENCOUNTER — Ambulatory Visit (HOSPITAL_BASED_OUTPATIENT_CLINIC_OR_DEPARTMENT_OTHER)
Admission: RE | Admit: 2020-01-04 | Discharge: 2020-01-04 | Disposition: A | Discharge status: Home / Self Care | Payer: BC Managed Care – PPO | Attending: Gynecologic Oncology | Admitting: Gynecologic Oncology

## 2020-01-04 ENCOUNTER — Encounter (HOSPITAL_BASED_OUTPATIENT_CLINIC_OR_DEPARTMENT_OTHER): Payer: Self-pay | Admitting: Gynecologic Oncology

## 2020-01-04 DIAGNOSIS — Z1509 Genetic susceptibility to other malignant neoplasm: Secondary | ICD-10-CM | POA: Insufficient documentation

## 2020-01-04 DIAGNOSIS — Z79899 Other long term (current) drug therapy: Secondary | ICD-10-CM | POA: Diagnosis not present

## 2020-01-04 DIAGNOSIS — Z409 Encounter for prophylactic surgery, unspecified: Secondary | ICD-10-CM

## 2020-01-04 DIAGNOSIS — Z9221 Personal history of antineoplastic chemotherapy: Secondary | ICD-10-CM | POA: Insufficient documentation

## 2020-01-04 DIAGNOSIS — Z148 Genetic carrier of other disease: Secondary | ICD-10-CM

## 2020-01-04 DIAGNOSIS — N800 Endometriosis of uterus: Secondary | ICD-10-CM

## 2020-01-04 DIAGNOSIS — K660 Peritoneal adhesions (postprocedural) (postinfection): Secondary | ICD-10-CM | POA: Diagnosis not present

## 2020-01-04 DIAGNOSIS — Z9013 Acquired absence of bilateral breasts and nipples: Secondary | ICD-10-CM | POA: Diagnosis not present

## 2020-01-04 DIAGNOSIS — N809 Endometriosis, unspecified: Secondary | ICD-10-CM | POA: Insufficient documentation

## 2020-01-04 DIAGNOSIS — Z803 Family history of malignant neoplasm of breast: Secondary | ICD-10-CM | POA: Insufficient documentation

## 2020-01-04 DIAGNOSIS — N888 Other specified noninflammatory disorders of cervix uteri: Secondary | ICD-10-CM | POA: Diagnosis not present

## 2020-01-04 DIAGNOSIS — Z853 Personal history of malignant neoplasm of breast: Secondary | ICD-10-CM | POA: Insufficient documentation

## 2020-01-04 DIAGNOSIS — Z1502 Genetic susceptibility to malignant neoplasm of ovary: Secondary | ICD-10-CM | POA: Diagnosis not present

## 2020-01-04 HISTORY — PX: ROBOTIC ASSISTED LAPAROSCOPIC HYSTERECTOMY AND SALPINGECTOMY: SHX6379

## 2020-01-04 LAB — URINALYSIS, COMPLETE (UACMP) WITH MICROSCOPIC
Bacteria, UA: NONE SEEN
Bilirubin Urine: NEGATIVE
Glucose, UA: NEGATIVE mg/dL
Ketones, ur: NEGATIVE mg/dL
Leukocytes,Ua: NEGATIVE
Nitrite: NEGATIVE
Protein, ur: NEGATIVE mg/dL
Specific Gravity, Urine: 1.005 (ref 1.005–1.030)
pH: 5 (ref 5.0–8.0)

## 2020-01-04 LAB — TYPE AND SCREEN
ABO/RH(D): O POS
Antibody Screen: NEGATIVE

## 2020-01-04 LAB — ABO/RH: ABO/RH(D): O POS

## 2020-01-04 LAB — POCT PREGNANCY, URINE: Preg Test, Ur: NEGATIVE

## 2020-01-04 SURGERY — XI ROBOTIC ASSISTED LAPAROSCOPIC HYSTERECTOMY AND SALPINGECTOMY
Anesthesia: General | Site: Abdomen

## 2020-01-04 MED ORDER — SODIUM CHLORIDE 0.9 % IR SOLN
Status: DC | PRN
  Administered 2020-01-04: 3000 mL

## 2020-01-04 MED ORDER — LIDOCAINE HCL (CARDIAC) PF 100 MG/5ML IV SOSY
PREFILLED_SYRINGE | INTRAVENOUS | Status: DC | PRN
  Administered 2020-01-04: 100 mg via INTRAVENOUS

## 2020-01-04 MED ORDER — ONDANSETRON HCL 4 MG/2ML IJ SOLN
INTRAMUSCULAR | Status: AC
  Filled 2020-01-04: qty 2

## 2020-01-04 MED ORDER — FENTANYL CITRATE (PF) 250 MCG/5ML IJ SOLN
INTRAMUSCULAR | Status: DC | PRN
Start: 2020-01-04 — End: 2020-01-04
  Administered 2020-01-04: 50 ug via INTRAVENOUS
  Administered 2020-01-04: 25 ug via INTRAVENOUS
  Administered 2020-01-04: 100 ug via INTRAVENOUS
  Administered 2020-01-04: 50 ug via INTRAVENOUS
  Administered 2020-01-04: 25 ug via INTRAVENOUS
  Administered 2020-01-04 (×2): 100 ug via INTRAVENOUS

## 2020-01-04 MED ORDER — HEPARIN SODIUM (PORCINE) 5000 UNIT/ML IJ SOLN
5000.0000 [IU] | INTRAMUSCULAR | Status: AC
  Administered 2020-01-04: 5000 [IU] via SUBCUTANEOUS

## 2020-01-04 MED ORDER — ACETAMINOPHEN 500 MG PO TABS
ORAL_TABLET | ORAL | Status: AC
  Filled 2020-01-04: qty 2

## 2020-01-04 MED ORDER — BUPIVACAINE HCL 0.25 % IJ SOLN
INTRAMUSCULAR | Status: DC | PRN
  Administered 2020-01-04: 20 mL

## 2020-01-04 MED ORDER — ROCURONIUM BROMIDE 10 MG/ML (PF) SYRINGE
PREFILLED_SYRINGE | INTRAVENOUS | Status: AC
  Filled 2020-01-04: qty 10

## 2020-01-04 MED ORDER — LIDOCAINE 2% (20 MG/ML) 5 ML SYRINGE
INTRAMUSCULAR | Status: AC
  Filled 2020-01-04: qty 5

## 2020-01-04 MED ORDER — OXYCODONE HCL 5 MG PO TABS: 5.0000 mg | ORAL_TABLET | ORAL | Status: DC | PRN

## 2020-01-04 MED ORDER — OXYCODONE HCL 5 MG PO TABS: 5.0000 mg | ORAL_TABLET | Freq: Once | ORAL | Status: DC | PRN

## 2020-01-04 MED ORDER — PROPOFOL 10 MG/ML IV BOLUS
INTRAVENOUS | Status: DC | PRN
  Administered 2020-01-04: 200 mg via INTRAVENOUS

## 2020-01-04 MED ORDER — DEXAMETHASONE SODIUM PHOSPHATE 10 MG/ML IJ SOLN: 4.0000 mg | INTRAMUSCULAR | Status: DC

## 2020-01-04 MED ORDER — ONDANSETRON HCL 4 MG/2ML IJ SOLN
INTRAMUSCULAR | Status: DC | PRN
  Administered 2020-01-04: 4 mg via INTRAVENOUS

## 2020-01-04 MED ORDER — GABAPENTIN 300 MG PO CAPS
300.0000 mg | ORAL_CAPSULE | ORAL | Status: AC
  Administered 2020-01-04: 300 mg via ORAL

## 2020-01-04 MED ORDER — DEXAMETHASONE SODIUM PHOSPHATE 10 MG/ML IJ SOLN
INTRAMUSCULAR | Status: DC | PRN
  Administered 2020-01-04: 5 mg via INTRAVENOUS

## 2020-01-04 MED ORDER — ONDANSETRON HCL 4 MG PO TABS: 4.0000 mg | ORAL_TABLET | Freq: Four times a day (QID) | ORAL | Status: DC | PRN

## 2020-01-04 MED ORDER — MIDAZOLAM HCL 2 MG/2ML IJ SOLN
INTRAMUSCULAR | Status: AC
  Filled 2020-01-04: qty 2

## 2020-01-04 MED ORDER — ONDANSETRON HCL 4 MG/2ML IJ SOLN
4.0000 mg | Freq: Four times a day (QID) | INTRAMUSCULAR | Status: DC | PRN
  Administered 2020-01-04: 4 mg via INTRAVENOUS

## 2020-01-04 MED ORDER — TRAMADOL HCL 50 MG PO TABS: 50.0000 mg | ORAL_TABLET | Freq: Four times a day (QID) | ORAL | Status: DC | PRN

## 2020-01-04 MED ORDER — SCOPOLAMINE 1 MG/3DAYS TD PT72
1.0000 | MEDICATED_PATCH | TRANSDERMAL | Status: DC
  Administered 2020-01-04: 1.5 mg via TRANSDERMAL

## 2020-01-04 MED ORDER — CELECOXIB 200 MG PO CAPS
400.0000 mg | ORAL_CAPSULE | ORAL | Status: AC
  Administered 2020-01-04: 400 mg via ORAL

## 2020-01-04 MED ORDER — PROPOFOL 10 MG/ML IV BOLUS
INTRAVENOUS | Status: AC
  Filled 2020-01-04: qty 40

## 2020-01-04 MED ORDER — HYDROMORPHONE HCL 1 MG/ML IJ SOLN
INTRAMUSCULAR | Status: AC
  Filled 2020-01-04: qty 1

## 2020-01-04 MED ORDER — OXYCODONE HCL 5 MG/5ML PO SOLN: 5.0000 mg | Freq: Once | ORAL | Status: DC | PRN

## 2020-01-04 MED ORDER — LACTATED RINGERS IV SOLN: INTRAVENOUS | Status: DC

## 2020-01-04 MED ORDER — CELECOXIB 200 MG PO CAPS
ORAL_CAPSULE | ORAL | Status: AC
  Filled 2020-01-04: qty 2

## 2020-01-04 MED ORDER — ROCURONIUM BROMIDE 10 MG/ML (PF) SYRINGE
PREFILLED_SYRINGE | INTRAVENOUS | Status: DC | PRN
  Administered 2020-01-04: 60 mg via INTRAVENOUS

## 2020-01-04 MED ORDER — KETOROLAC TROMETHAMINE 30 MG/ML IJ SOLN: 30.0000 mg | Freq: Once | INTRAMUSCULAR | Status: DC | PRN

## 2020-01-04 MED ORDER — CEFAZOLIN SODIUM-DEXTROSE 2-4 GM/100ML-% IV SOLN
2.0000 g | INTRAVENOUS | Status: AC
  Administered 2020-01-04: 2 g via INTRAVENOUS

## 2020-01-04 MED ORDER — ACETAMINOPHEN 500 MG PO TABS
1000.0000 mg | ORAL_TABLET | ORAL | Status: AC
  Administered 2020-01-04: 1000 mg via ORAL

## 2020-01-04 MED ORDER — HEPARIN SODIUM (PORCINE) 5000 UNIT/ML IJ SOLN
INTRAMUSCULAR | Status: AC
  Filled 2020-01-04: qty 1

## 2020-01-04 MED ORDER — FENTANYL CITRATE (PF) 250 MCG/5ML IJ SOLN
INTRAMUSCULAR | Status: AC
  Filled 2020-01-04: qty 5

## 2020-01-04 MED ORDER — CEFAZOLIN SODIUM-DEXTROSE 2-4 GM/100ML-% IV SOLN
INTRAVENOUS | Status: AC
  Filled 2020-01-04: qty 100

## 2020-01-04 MED ORDER — MIDAZOLAM HCL 2 MG/2ML IJ SOLN
INTRAMUSCULAR | Status: DC | PRN
  Administered 2020-01-04: 2 mg via INTRAVENOUS

## 2020-01-04 MED ORDER — HYDROMORPHONE HCL 1 MG/ML IJ SOLN
0.2500 mg | INTRAMUSCULAR | Status: DC | PRN
  Administered 2020-01-04 (×3): 0.5 mg via INTRAVENOUS

## 2020-01-04 MED ORDER — GABAPENTIN 300 MG PO CAPS
ORAL_CAPSULE | ORAL | Status: AC
  Filled 2020-01-04: qty 1

## 2020-01-04 MED ORDER — SUGAMMADEX SODIUM 200 MG/2ML IV SOLN
INTRAVENOUS | Status: DC | PRN
  Administered 2020-01-04: 150 mg via INTRAVENOUS

## 2020-01-04 MED ORDER — HYDROMORPHONE HCL 1 MG/ML IJ SOLN: 0.2000 mg | INTRAMUSCULAR | Status: DC | PRN

## 2020-01-04 MED ORDER — SCOPOLAMINE 1 MG/3DAYS TD PT72
MEDICATED_PATCH | TRANSDERMAL | Status: AC
  Filled 2020-01-04: qty 1

## 2020-01-04 SURGICAL SUPPLY — 67 items
ADH SKN CLS APL DERMABOND .7 (GAUZE/BANDAGES/DRESSINGS) ×1
AGENT HMST KT MTR STRL THRMB (HEMOSTASIS)
APL ESCP 34 STRL LF DISP (HEMOSTASIS)
APPLICATOR SURGIFLO ENDO (HEMOSTASIS) IMPLANT
BAG LAPAROSCOPIC 12 15 PORT 16 (BASKET) IMPLANT
BAG RETRIEVAL 12/15 (BASKET)
BAG SPEC RTRVL LRG 6X4 10 (ENDOMECHANICALS)
BLADE SURG 10 STRL SS (BLADE) IMPLANT
COVER BACK TABLE 60X90IN (DRAPES) ×2 IMPLANT
COVER TIP SHEARS 8 DVNC (MISCELLANEOUS) ×1 IMPLANT
COVER TIP SHEARS 8MM DA VINCI (MISCELLANEOUS) ×2
COVER WAND RF STERILE (DRAPES) ×2 IMPLANT
DECANTER SPIKE VIAL GLASS SM (MISCELLANEOUS) IMPLANT
DERMABOND ADVANCED (GAUZE/BANDAGES/DRESSINGS) ×1
DERMABOND ADVANCED .7 DNX12 (GAUZE/BANDAGES/DRESSINGS) ×1 IMPLANT
DRAPE ARM DVNC X/XI (DISPOSABLE) ×4 IMPLANT
DRAPE COLUMN DVNC XI (DISPOSABLE) ×1 IMPLANT
DRAPE DA VINCI XI ARM (DISPOSABLE) ×8
DRAPE DA VINCI XI COLUMN (DISPOSABLE) ×2
DRAPE SHEET LG 3/4 BI-LAMINATE (DRAPES) ×2 IMPLANT
DRAPE SURG IRRIG POUCH 19X23 (DRAPES) ×2 IMPLANT
ELECT REM PT RETURN 9FT ADLT (ELECTROSURGICAL) ×2
ELECTRODE REM PT RTRN 9FT ADLT (ELECTROSURGICAL) ×1 IMPLANT
GAUZE 4X4 16PLY RFD (DISPOSABLE) ×2 IMPLANT
GLOVE BIO SURGEON STRL SZ 6 (GLOVE) ×8 IMPLANT
GLOVE BIO SURGEON STRL SZ 6.5 (GLOVE) ×4 IMPLANT
HOLDER FOLEY CATH W/STRAP (MISCELLANEOUS) ×2 IMPLANT
IRRIG SUCT STRYKERFLOW 2 WTIP (MISCELLANEOUS) ×2
IRRIGATION SUCT STRKRFLW 2 WTP (MISCELLANEOUS) ×1 IMPLANT
KIT PROCEDURE DA VINCI SI (MISCELLANEOUS)
KIT PROCEDURE DVNC SI (MISCELLANEOUS) IMPLANT
KIT TURNOVER CYSTO (KITS) ×2 IMPLANT
LEGGING LITHOTOMY PAIR STRL (DRAPES) ×2 IMPLANT
MANIPULATOR UTERINE 4.5 ZUMI (MISCELLANEOUS) ×2 IMPLANT
NDL SAFETY ECLIPSE 18X1.5 (NEEDLE) IMPLANT
NDL SPNL 18GX3.5 QUINCKE PK (NEEDLE) IMPLANT
NEEDLE HYPO 18GX1.5 SHARP (NEEDLE)
NEEDLE HYPO 22GX1.5 SAFETY (NEEDLE) ×2 IMPLANT
NEEDLE SPNL 18GX3.5 QUINCKE PK (NEEDLE) IMPLANT
OBTURATOR OPTICAL STANDARD 8MM (TROCAR) ×2
OBTURATOR OPTICAL STND 8 DVNC (TROCAR) ×1
OBTURATOR OPTICALSTD 8 DVNC (TROCAR) ×1 IMPLANT
PACK ROBOT GYN CUSTOM WL (TRAY / TRAY PROCEDURE) ×2 IMPLANT
PACK ROBOTIC GOWN (GOWN DISPOSABLE) ×2 IMPLANT
PAD POSITIONING PINK XL (MISCELLANEOUS) ×2 IMPLANT
PENCIL SMOKE EVACUATOR (MISCELLANEOUS) IMPLANT
PORT ACCESS TROCAR AIRSEAL 12 (TROCAR) ×1 IMPLANT
PORT ACCESS TROCAR AIRSEAL 5M (TROCAR) ×1
POUCH SPECIMEN RETRIEVAL 10MM (ENDOMECHANICALS) IMPLANT
SEAL CANN UNIV 5-8 DVNC XI (MISCELLANEOUS) ×3 IMPLANT
SEAL XI 5MM-8MM UNIVERSAL (MISCELLANEOUS) ×8
SET TRI-LUMEN FLTR TB AIRSEAL (TUBING) ×2 IMPLANT
SURGIFLO W/THROMBIN 8M KIT (HEMOSTASIS) IMPLANT
SUT MNCRL AB 4-0 PS2 18 (SUTURE) IMPLANT
SUT VIC AB 0 CT1 36 (SUTURE) IMPLANT
SUT VIC AB 3-0 SH 27 (SUTURE)
SUT VIC AB 3-0 SH 27X BRD (SUTURE) IMPLANT
SUT VIC AB 4-0 PS2 18 (SUTURE) ×4 IMPLANT
SYR 10ML LL (SYRINGE) IMPLANT
SYR CONTROL 10ML LL (SYRINGE) ×4 IMPLANT
TRAP SPECIMEN MUCUS 40CC (MISCELLANEOUS) IMPLANT
TRAY FOLEY W/BAG SLVR 14FR LF (SET/KITS/TRAYS/PACK) ×2 IMPLANT
TROCAR BLADELESS OPT 12M 100M (ENDOMECHANICALS) IMPLANT
TUBE CONNECTING 12X1/4 (SUCTIONS) ×2 IMPLANT
UNDERPAD 30X36 HEAVY ABSORB (UNDERPADS AND DIAPERS) ×2 IMPLANT
WATER STERILE IRR 1000ML POUR (IV SOLUTION) ×2 IMPLANT
YANKAUER SUCT BULB TIP NO VENT (SUCTIONS) IMPLANT

## 2020-01-04 NOTE — Interval H&P Note (Signed)
History and Physical Interval Note:  01/04/2020 9:20 AM  [name]  has presented today for surgery, with the diagnosis of [facility] SYNDROME.  The various methods of treatment have been discussed with the patient and family. After consideration of risks, benefits and other options for treatment, the patient has consented to  Procedure(s): XI ROBOTIC ASSISTED LAPAROSCOPIC HYSTERECTOMY AND BILATERAL SALPINGECTOMY (N/A) as a surgical intervention.  The patient's history has been reviewed, patient examined, no change in status, stable for surgery.  I have reviewed the patient's chart and labs.  Questions were answered to the patient's satisfaction.     [name]

## 2020-01-04 NOTE — Anesthesia Procedure Notes (Signed)
Procedure Name: Intubation Date/Time: 01/04/2020 10:13 AM Performed by: Raenette Rover, CRNA Pre-anesthesia Checklist: Patient identified, Emergency Drugs available, Suction available and Patient being monitored Patient Re-evaluated:Patient Re-evaluated prior to induction Oxygen Delivery Method: Circle system utilized Preoxygenation: Pre-oxygenation with 100% oxygen Induction Type: IV induction Ventilation: Mask ventilation without difficulty Laryngoscope Size: Mac and 3 Grade View: Grade I Tube type: Oral Tube size: 7.0 mm Number of attempts: 1 Airway Equipment and Method: Stylet Placement Confirmation: ETT inserted through vocal cords under direct vision,  positive ETCO2 and breath sounds checked- equal and bilateral Secured at: 21 cm Tube secured with: Tape Dental Injury: Teeth and Oropharynx as per pre-operative assessment

## 2020-01-04 NOTE — Anesthesia Preprocedure Evaluation (Signed)
Anesthesia Evaluation  Patient identified by MRN, date of birth, ID band Patient awake    Reviewed: Allergy & Precautions, NPO status , Patient's Chart, lab work & pertinent test results  History of Anesthesia Complications (+) PONV  Airway Mallampati: II  TM Distance: >3 FB Neck ROM: Full    Dental no notable dental hx.    Pulmonary neg pulmonary ROS,    Pulmonary exam normal breath sounds clear to auscultation       Cardiovascular negative cardio ROS Normal cardiovascular exam Rhythm:Regular Rate:Normal     Neuro/Psych negative neurological ROS  negative psych ROS   GI/Hepatic negative GI ROS, Neg liver ROS,   Endo/Other  negative endocrine ROS  Renal/GU negative Renal ROS  negative genitourinary   Musculoskeletal negative musculoskeletal ROS (+)   Abdominal   Peds negative pediatric ROS (+)  Hematology negative hematology ROS (+)   Anesthesia Other Findings   Reproductive/Obstetrics negative OB ROS                             Anesthesia Physical Anesthesia Plan  ASA: II  Anesthesia Plan: General   Post-op Pain Management:    Induction: Intravenous  PONV Risk Score and Plan: 4 or greater and Ondansetron, Dexamethasone, Midazolam, Scopolamine patch - Pre-op and Treatment may vary due to age or medical condition  Airway Management Planned: Oral ETT  Additional Equipment:   Intra-op Plan:   Post-operative Plan: Extubation in OR  Informed Consent: I have reviewed the patients History and Physical, chart, labs and discussed the procedure including the risks, benefits and alternatives for the proposed anesthesia with the patient or authorized representative who has indicated his/her understanding and acceptance.     Dental advisory given  Plan Discussed with: CRNA and Surgeon  Anesthesia Plan Comments:         Anesthesia Quick Evaluation

## 2020-01-04 NOTE — Telephone Encounter (Signed)
faxed disability forms

## 2020-01-04 NOTE — Op Note (Signed)
OPERATIVE NOTE  Pre-operative Diagnosis: Lynch syndrome, risk-reducing surgery  Post-operative Diagnosis: same, endometriosis  Operation: Robotic-assisted laparoscopic total hysterectomy with bilateral salpingectomy  Surgeon: Jeral Pinch MD  Assistant Surgeon: [name] NP  Anesthesia: GET  Urine Output: 200 cc  Operative Findings: On EUA, mobile 10cm uterus. On intra-abdominal entry, normal upper abdominal survey. Omentum adherent to the anterior abdominal wall from several cm below the umbilicus. Normal appearing small and large bowel. Uterus 10cm, normal appearing. Normal appearing bilateral adnexa. Powder burn implant on right uterosacral ligament. Significant adhesions between bladder and LUS/cervix. No intra-abdominal or pelvic disease.   Estimated Blood Loss:  less than 100 mL      Total IV Fluids: see I&O flowsheet         Specimens: uterus, cervix, bilateral tubes         Complications:  None apparent; patient tolerated the procedure well.         Disposition: PACU - hemodynamically stable.  Procedure Details  The patient was seen in the Holding Room. The risks, benefits, complications, treatment options, and expected outcomes were discussed with the patient.  The patient concurred with the proposed plan, giving informed consent.  The site of surgery properly noted/marked. The patient was identified as [name] and the procedure verified as a Robotic-assisted hysterectomy with bilateral salpingectomy.   After induction of anesthesia, the patient was draped and prepped in the usual sterile manner. Patient was placed in supine position after anesthesia and draped and prepped in the usual sterile manner as follows: Her arms were tucked to her side with all appropriate precautions.  The shoulders were stabilized with padded shoulder blocks applied to the acromium processes.  The patient was placed in the semi-lithotomy position in [place].  The perineum and  vagina were prepped with Hibiclens. The patient was draped after the CholoraPrep had been allowed to dry for 3 minutes.  A Time Out was held and the above information confirmed.  The urethra was prepped with Betadine. Foley catheter was placed.  A sterile speculum was placed in the vagina.  The cervix was grasped with a single-tooth tenaculum. The cervix was dilated with Kennon Rounds dilators.  The ZUMI uterine manipulator with a medium colpotomizer ring was placed without difficulty.  A pneum occluder balloon was placed over the manipulator.  OG tube placement was confirmed and to suction.   Next, a 10 mm skin incision was made 1 cm below the subcostal margin in the midclavicular line.  The 5 mm Optiview port and scope was used for direct entry.  Opening pressure was under 10 mm CO2.  The abdomen was insufflated and the findings were noted as above.   At this point and all points during the procedure, the patient's intra-abdominal pressure did not exceed 15 mmHg. Next, an 8 mm skin incision was made superior to the umbilicus and a right and left port were placed about 8 cm lateral to the robot port on the right and left side.  A fourth arm was placed on the right.  The 5 mm assist trocar was exchanged for a 10-12 mm port. All ports were placed under direct visualization.  The patient was placed in steep Trendelenburg.    The robot was docked in the normal manner. Bipolar and monopolar electrocautery was used to lyse adhesions of the omentum to the anterior abdominal wall. Bowel was folded away into the upper abdomen.    The right and left peritoneum were opened parallel to the IP  ligament to open the retroperitoneal spaces bilaterally. The round ligaments were transected. The ureter was again noted to be on the medial leaf of the broad ligament.  The fallopian tube was elevated and electrocautery was used to cauterize and then transect just inferior to the fallopian tube, free it from the ovary. The utero-ovarian  ligament was then skeletonized, cauterized and cut.    The posterior peritoneum was taken down to the level of the KOH ring.  The anterior peritoneum was also taken down.  The bladder flap was created to the level of the KOH ring.  The uterine artery on the right side was skeletonized, cauterized and cut in the normal manner.  A similar procedure was performed on the left.  The colpotomy was made and the uterus, cervix, bilateral ovaries were amputated and delivered through the vagina.  Pedicles were inspected and excellent hemostasis was achieved.    The colpotomy at the vaginal cuff was closed with Vicryl on a CT1 needle in running manner.  Irrigation was used and excellent hemostasis was achieved.  At this point in the procedure was completed.  Robotic instruments were removed under direct visulaization.  The robot was undocked. The fascia at the 10-12 mm port was closed with 0 Vicryl on a UR-5 needle.  The subcuticular tissue was closed with 4-0 Vicryl and the skin was closed with 4-0 Monocryl in a subcuticular manner.  Dermabond was applied.    The vagina was swabbed with minimal bleeding noted.   All sponge, lap and needle counts were correct x  3. Foley catheter was removed.  The patient was transferred to the recovery room in stable condition.  Jeral Pinch, MD

## 2020-01-04 NOTE — Discharge Instructions (Addendum)
01/04/2020  Return to work: 2-4 weeks if applicable  Activity: 1. Be up and out of the bed during the day.  Take a nap if needed.  You may walk up steps but be careful and use the hand rail.  Stair climbing will tire you more than you think, you may need to stop part way and rest.   2. No lifting or straining for 6 weeks.  3. No driving for 1 week(s).  Do not drive if you are taking narcotic pain medicine.  4. Shower daily.  Use soap and water on your incision and pat dry; don't rub.  No tub baths until cleared by your surgeon.   5. No sexual activity and nothing in the vagina for 8 weeks.  6. You may experience a small amount of clear drainage from your incisions, which is normal.  If the drainage persists or increases, please call the office.  7. You may experience vaginal spotting after surgery or around the 6-8 week mark from surgery when the stitches at the top of the vagina begin to dissolve.  The spotting is normal but if you experience heavy bleeding, call our office.  8. Take Tylenol or ibuprofen first for pain and only use narcotic pain medication for severe pain not relieved by the Tylenol or Ibuprofen.  Monitor your Tylenol intake to a max of 4,000 mg.  Diet: 1. Low sodium Heart Healthy Diet is recommended.  2. It is safe to use a laxative, such as Miralax or Colace, if you have difficulty moving your bowels. You can take Sennakot at bedtime every evening to keep bowel movements regular and to prevent constipation.    Wound Care: 1. Keep clean and dry.  Shower daily.  Reasons to call the Doctor:  Fever - Oral temperature greater than 100.4 degrees Fahrenheit  Foul-smelling vaginal discharge  Difficulty urinating  Nausea and vomiting  Increased pain at the site of the incision that is unrelieved with pain medicine.  Difficulty breathing with or without chest pain  New calf pain especially if only on one side  Sudden, continuing increased vaginal bleeding with or  without clots.   Contacts: For questions or concerns you should contact:  Dr. Jeral Pinch at [phone]  [name], NP at [phone]  After Hours: call [phone] and have the GYN Oncologist paged/contacted   Post Anesthesia Home Care Instructions  Activity: Get plenty of rest for the remainder of the day. A responsible individual must stay with you for 24 hours following the procedure.  For the next 24 hours, DO NOT: -Drive a car -Paediatric nurse -Drink alcoholic beverages -Take any medication unless instructed by your physician -Make any legal decisions or sign important papers.  Meals: Start with liquid foods such as gelatin or soup. Progress to regular foods as tolerated. Avoid greasy, spicy, heavy foods. If nausea and/or vomiting occur, drink only clear liquids until the nausea and/or vomiting subsides. Call your physician if vomiting continues.  Special Instructions/Symptoms: Your throat may feel dry or sore from the anesthesia or the breathing tube placed in your throat during surgery. If this causes discomfort, gargle with warm salt water. The discomfort should disappear within 24 hours.  If you had a scopolamine patch placed behind your ear for the management of post- operative nausea and/or vomiting:  1. The medication in the patch is effective for 72 hours, after which it should be removed.  Wrap patch in a tissue and discard in the trash. Wash hands thoroughly with  soap and water. 2. You may remove the patch earlier than 72 hours if you experience unpleasant side effects which may include dry mouth, dizziness or visual disturbances. 3. Avoid touching the patch. Wash your hands with soap and water after contact with the patch.

## 2020-01-04 NOTE — Anesthesia Postprocedure Evaluation (Signed)
Anesthesia Post Note  Patient: [name]  Procedure(s) Performed: XI ROBOTIC ASSISTED LAPAROSCOPIC HYSTERECTOMY AND BILATERAL SALPINGECTOMY (N/A Abdomen)     Patient location during evaluation: PACU Anesthesia Type: General Level of consciousness: awake and alert Pain management: pain level controlled Vital Signs Assessment: post-procedure vital signs reviewed and stable Respiratory status: spontaneous breathing, nonlabored ventilation, respiratory function stable and patient connected to nasal cannula oxygen Cardiovascular status: blood pressure returned to baseline and stable Postop Assessment: no apparent nausea or vomiting Anesthetic complications: no   No complications documented.  Last Vitals:  Vitals:   01/04/20 1400 01/04/20 1415  BP: 134/86 (!) 139/92  Pulse: 87 91  Resp: 12 12  Temp:  36.7 C  SpO2: 98% 97%    Last Pain:  Vitals:   01/04/20 1415  TempSrc:   PainSc: 2                  [name]

## 2020-01-04 NOTE — Transfer of Care (Signed)
Immediate Anesthesia Transfer of Care Note  Patient: [name]  Procedure(s) Performed: XI ROBOTIC ASSISTED LAPAROSCOPIC HYSTERECTOMY AND BILATERAL SALPINGECTOMY (N/A Abdomen)  Patient Location: PACU  Anesthesia Type:General  Level of Consciousness: awake, alert , oriented, drowsy and patient cooperative  Airway & Oxygen Therapy: Patient Spontanous Breathing and Patient connected to nasal cannula oxygen  Post-op Assessment: Report given to RN and Post -op Vital signs reviewed and stable  Post vital signs: Reviewed and stable  Last Vitals:  Vitals Value Taken Time  BP 151/92 01/04/20 1231  Temp    Pulse 77 01/04/20 1232  Resp 12 01/04/20 1232  SpO2 100 % 01/04/20 1232  Vitals shown include unvalidated device data.  Last Pain:  Vitals:   01/04/20 0942  TempSrc: Oral  PainSc: 0-No pain      Patients Stated Pain Goal: 4 (05/13/30 3557)  Complications: No complications documented.

## 2020-01-05 ENCOUNTER — Encounter (HOSPITAL_BASED_OUTPATIENT_CLINIC_OR_DEPARTMENT_OTHER): Payer: Self-pay | Admitting: Gynecologic Oncology

## 2020-01-05 ENCOUNTER — Telehealth: Payer: Self-pay

## 2020-01-05 LAB — CYTOLOGY - NON PAP

## 2020-01-05 NOTE — Telephone Encounter (Signed)
Post op call to patient today.  She reports that she is eating and drinking well, and no problems with urination.  Patient reported some post op nausea which went away after taking Zofran she had at home. Denies temp.  The only pain she is having is in her shoulders and nurse explained this could be gas related to surgery.  She has not started passing gas or had a bowel movement as of yet. Patient has taken some oxycodone for shoulder and ibuprofen as well.  Explained to patient she can increase stool softener to twice a day if needed.  Advised patient to call the office if she does not start passing gas or has any other issues.

## 2020-01-06 ENCOUNTER — Ambulatory Visit: Payer: BC Managed Care – PPO | Admitting: Gynecologic Oncology

## 2020-01-06 LAB — SURGICAL PATHOLOGY

## 2020-01-14 ENCOUNTER — Other Ambulatory Visit: Payer: Self-pay

## 2020-01-14 ENCOUNTER — Emergency Department (HOSPITAL_COMMUNITY)
Admission: EM | Admit: 2020-01-14 | Discharge: 2020-01-14 | Discharge status: Against Medical Advice | Payer: BC Managed Care – PPO

## 2020-01-24 ENCOUNTER — Encounter: Payer: Self-pay | Admitting: Gynecologic Oncology

## 2020-01-24 ENCOUNTER — Inpatient Hospital Stay: Payer: BC Managed Care – PPO

## 2020-01-24 ENCOUNTER — Encounter: Payer: Self-pay | Admitting: Oncology

## 2020-01-24 ENCOUNTER — Inpatient Hospital Stay: Payer: BC Managed Care – PPO | Attending: Hematology and Oncology | Admitting: Gynecologic Oncology

## 2020-01-24 ENCOUNTER — Ambulatory Visit: Payer: BC Managed Care – PPO

## 2020-01-24 ENCOUNTER — Other Ambulatory Visit: Payer: Self-pay

## 2020-01-24 VITALS — BP 144/92 | HR 77 | Temp 98.6°F | Resp 16 | Ht 63.0 in | Wt 159.0 lb

## 2020-01-24 DIAGNOSIS — R3121 Asymptomatic microscopic hematuria: Secondary | ICD-10-CM | POA: Diagnosis not present

## 2020-01-24 DIAGNOSIS — Z171 Estrogen receptor negative status [ER-]: Secondary | ICD-10-CM | POA: Insufficient documentation

## 2020-01-24 DIAGNOSIS — C50411 Malignant neoplasm of upper-outer quadrant of right female breast: Secondary | ICD-10-CM | POA: Diagnosis not present

## 2020-01-24 DIAGNOSIS — F419 Anxiety disorder, unspecified: Secondary | ICD-10-CM | POA: Diagnosis not present

## 2020-01-24 DIAGNOSIS — Z1509 Genetic susceptibility to other malignant neoplasm: Secondary | ICD-10-CM

## 2020-01-24 LAB — URINALYSIS, COMPLETE (UACMP) WITH MICROSCOPIC
Bacteria, UA: NONE SEEN
Bilirubin Urine: NEGATIVE
Glucose, UA: NEGATIVE mg/dL
Ketones, ur: NEGATIVE mg/dL
Leukocytes,Ua: NEGATIVE
Nitrite: NEGATIVE
Protein, ur: NEGATIVE mg/dL
Specific Gravity, Urine: 1.009 (ref 1.005–1.030)
pH: 5 (ref 5.0–8.0)

## 2020-01-24 NOTE — Progress Notes (Signed)
Faxed referral and office notes/labs to [facility].

## 2020-01-24 NOTE — Patient Instructions (Signed)
I will release your urine results to you.  If there is still blood in the urine, then we will talk about the need for you to see a urologist.  Remember, no heavy lifting until you are 6 weeks out from surgery.  If you need anything in the future, do not hesitate to call the clinic at [phone].

## 2020-01-24 NOTE — Progress Notes (Signed)
Gynecologic Oncology Return Clinic Visit  01/24/20  Reason for Visit: post-op follow-up  Treatment History: Oncology History  Malignant neoplasm of upper-outer quadrant of right breast in female, estrogen receptor negative ([place])  10/11/2018 Initial Diagnosis   Patient palpated a right breast mass, mammogram revealed 1.6 cm mass, biopsy revealed grade 3 invasive ductal carcinoma ER 0%, PR 0%, Ki-67 20%, HER-2 3+ positive, T1CN0 stage IA   10/19/2018 Cancer Staging   Staging form: Breast, AJCC 8th Edition - Clinical stage from 10/19/2018: Stage IA (cT1c, cN0, cM0, G3, ER-, PR-, HER2+) - Signed by [name], MD on 10/19/2018   10/29/2018 - 01/26/2019 Chemotherapy   The patient had trastuzumab (HERCEPTIN) 300 mg in sodium chloride 0.9 % 250 mL chemo infusion, 294 mg, Intravenous,  Once, 2 of 2 cycles Administration: 300 mg (10/29/2018), 150 mg (11/03/2018), 150 mg (12/02/2018), 150 mg (11/12/2018), 150 mg (11/25/2018), 150 mg (12/09/2018) PACLitaxel (TAXOL) 150 mg in sodium chloride 0.9 % 250 mL chemo infusion (</= 60m/m2), 80 mg/m2 = 150 mg, Intravenous,  Once, 3 of 3 cycles Dose modification: 65 mg/m2 (original dose 80 mg/m2, Cycle 3, Reason: Dose not tolerated) Administration: 150 mg (10/29/2018), 150 mg (11/03/2018), 150 mg (12/02/2018), 150 mg (11/12/2018), 150 mg (11/25/2018), 150 mg (12/09/2018), 150 mg (12/16/2018), 150 mg (12/23/2018), 120 mg (12/30/2018), 120 mg (01/06/2019), 120 mg (01/13/2019), 120 mg (01/20/2019) trastuzumab-dkst (OGIVRI) 150 mg in sodium chloride 0.9 % 250 mL chemo infusion, 150 mg (100 % of original dose 150 mg), Intravenous,  Once, 2 of 2 cycles Dose modification: 150 mg (original dose 150 mg, Cycle 2, Reason: Other (see comments), Comment: Biosimilar Conversion), 450 mg (original dose 150 mg, Cycle 3, Reason: Other (see comments), Comment: Biosimilar Conversion) Administration: 150 mg (12/16/2018), 150 mg (12/23/2018), 150 mg (12/30/2018), 150 mg (01/06/2019), 150 mg (01/13/2019), 450 mg  (01/20/2019)  for chemotherapy treatment.    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  The Common Hereditary Gene Panel offered by [facility] includes sequencing and/or deletion duplication testing of the following 48 genes: APC, ATM, AXIN2, BARD1, BMPR1A, BRCA1, BRCA2, BRIP1, CDH1, CDK4, CDKN2A (p14ARF), CDKN2A (p16INK4a), CHEK2, CTNNA1, DICER1, EPCAM (Deletion/duplication testing only), GREM1 (promoter region deletion/duplication testing only), KIT, MEN1, MLH1, MSH2, MSH3, MSH6, MUTYH, NBN, NF1, NHTL1, PALB2, PDGFRA, PMS2, POLD1, POLE, PTEN, RAD50, RAD51C, RAD51D, RNF43, SDHB, SDHC, SDHD, SMAD4, SMARCA4. STK11, TP53, TSC1, TSC2, and VHL.  The following genes were evaluated for sequence changes only: SDHA and HOXB13 c.251G>A variant only.   Two VUS's also found - one in ATM c.133C>T and the other in POLE c.1280C>T.  These will not affect medical management. The report date is November 12, 2018.   02/10/2019 -  Chemotherapy   The patient had trastuzumab-hyaluronidase-oysk (HERCEPTIN HYLECTA) 600-10000 MG-UNT/5ML chemo SQ injection 600 mg, 600 mg, Subcutaneous,  Once, 7 of 7 cycles Administration: 600 mg (06/16/2019), 600 mg (07/07/2019), 600 mg (07/28/2019), 600 mg (08/18/2019), 600 mg (09/08/2019), 600 mg (09/29/2019), 600 mg (10/20/2019) trastuzumab-dkst (OGIVRI) 462 mg in sodium chloride 0.9 % 250 mL chemo infusion, 6 mg/kg = 630 mg, Intravenous,  Once, 6 of 6 cycles Administration: 450 mg (03/03/2019), 450 mg (05/05/2019), 450 mg (05/26/2019), 450 mg (03/24/2019), 450 mg (04/14/2019)  for chemotherapy treatment.    02/22/2019 Surgery   Bilateral mastectomies ([name]) Left breast: benign tissue and no evidence of malignancy Right breast: no residual carcinoma, 2 right axillary lymph nodes negative      Interval  History: Patient reports doing very well since surgery.  She denies any vaginal bleeding or discharge.   She reports a good appetite without nausea or emesis.  She reports regular bowel and bladder function.  She had some gas pain and constipation the first several days after surgery, but this resolved with medication.   Past Medical/Surgical History: Past Medical History:  Diagnosis Date  . Anxiety    over cancer diagnosis  . Breast cancer ([place])    right breast cancer chemo finished per pt  . Complication of anesthesia   . Family history of breast cancer   . Lynch syndrome   . PONV (postoperative nausea and vomiting)   . Scleroderma [facility])     Past Surgical History:  Procedure Laterality Date  . BREAST RECONSTRUCTION WITH PLACEMENT OF TISSUE EXPANDER AND FLEX HD (ACELLULAR HYDRATED DERMIS) Bilateral 02/22/2019   Procedure: BILATERAL BREAST RECONSTRUCTION WITH PLACEMENT OF TISSUE EXPANDER AND FLEX HD (ACELLULAR HYDRATED DERMIS);  Surgeon: Irene Limbo, MD;  Location: [place];  Service: Plastics;  Laterality: Bilateral;  . CERVICAL FUSION  2019  . CESAREAN SECTION  2005  . CESAREAN SECTION  02/27/2011   Procedure: CESAREAN SECTION;  Surgeon: Shon Millet II;  Location: [facility];  Service: Gynecology;  Laterality: N/A;  repeat  . LIPOSUCTION WITH LIPOFILLING Bilateral 05/31/2019   Procedure: LIPOFILLING FROM ABDOMEN TO BILATERAL CHEST;  Surgeon: Irene Limbo, MD;  Location: [place];  Service: Plastics;  Laterality: Bilateral;  . NIPPLE SPARING MASTECTOMY WITH SENTINEL LYMPH NODE BIOPSY Bilateral 02/22/2019   Procedure: RIGHT NIPPLE SPARING MASTECTOMY AND LEFT RISK REDUCING NIPPLE SPARING MASTECTOMY WITH RIGHT AXILLARY SENTINEL LYMPH NODE BIOPSY;  Surgeon: Rolm Bookbinder, MD;  Location: [place];  Service: General;  Laterality: Bilateral;  BILATERAL PECTORAL BLOCK  . PORT-A-CATH REMOVAL Right 05/31/2019   Procedure: REMOVAL PORT-A-CATH;  Surgeon: Irene Limbo, MD;  Location: [place];  Service: Plastics;   Laterality: Right;  . PORTACATH PLACEMENT Right 10/20/2018   Procedure: INSERTION PORT-A-CATH WITH ULTRASOUND;  Surgeon: Rolm Bookbinder, MD;  Location: [place];  Service: General;  Laterality: Right;  . REMOVAL OF BILATERAL TISSUE EXPANDERS WITH PLACEMENT OF BILATERAL BREAST IMPLANTS Bilateral 05/31/2019   Procedure: REMOVAL OF BILATERAL TISSUE EXPANDERS WITH PLACEMENT OF SILICONE BILATERAL BREAST IMPLANTS;  Surgeon: Irene Limbo, MD;  Location: [place];  Service: Plastics;  Laterality: Bilateral;  . ROBOTIC ASSISTED LAPAROSCOPIC HYSTERECTOMY AND SALPINGECTOMY N/A 01/04/2020   Procedure: XI ROBOTIC ASSISTED LAPAROSCOPIC HYSTERECTOMY AND BILATERAL SALPINGECTOMY;  Surgeon: [name], MD;  Location: [facility];  Service: Gynecology;  Laterality: N/A;    Family History  Problem Relation Age of Onset  . Breast cancer Other   . Breast cancer Other   . Hashimoto's thyroiditis Son   . Ulcerative colitis Niece   . Ulcerative colitis Mother   . Diabetes Mother   . Colon polyps Father   . Diabetes Father   . Rheum arthritis Maternal Grandmother   . Heart disease Maternal Grandfather   . Breast cancer Other        paternal great grandmother  . Colon cancer Cousin        _0  cousin  . Esophageal cancer Neg Hx   . Rectal cancer Neg Hx   . Stomach cancer Neg Hx     Social History   Socioeconomic History  . Marital status: Married    Spouse name: Not on file  .  Number of children: 2  . Years of education: Not on file  . Highest education level: Not on file  Occupational History  . Occupation: Engineer, structural  . Occupation: treatment coordinator  Tobacco Use  . Smoking status: Never Smoker  . Smokeless tobacco: Never Used  Vaping Use  . Vaping Use: Never used  Substance and Sexual Activity  . Alcohol use: No  . Drug use: No  . Sexual activity: Yes    Birth control/protection: Surgical    Comment: husband has had  vasectomy  Other Topics Concern  . Not on file  Social History Narrative  . Not on file   Social Determinants of Health   Financial Resource Strain:   . Difficulty of Paying Living Expenses: Not on file  Food Insecurity:   . Worried About Charity fundraiser in the Last Year: Not on file  . Ran Out of Food in the Last Year: Not on file  Transportation Needs:   . Lack of Transportation (Medical): Not on file  . Lack of Transportation (Non-Medical): Not on file  Physical Activity:   . Days of Exercise per Week: Not on file  . Minutes of Exercise per Session: Not on file  Stress:   . Feeling of Stress : Not on file  Social Connections:   . Frequency of Communication with Friends and Family: Not on file  . Frequency of Social Gatherings with Friends and Family: Not on file  . Attends Religious Services: Not on file  . Active Member of Clubs or Organizations: Not on file  . Attends Archivist Meetings: Not on file  . Marital Status: Not on file    Current Medications:  Current Outpatient Medications:  .  buPROPion (WELLBUTRIN XL) 150 MG 24 hr tablet, Take 1 tablet (150 mg total) by mouth daily., Disp: 30 tablet, Rfl: 6  Review of Systems: Denies appetite changes, fevers, chills, fatigue, unexplained weight changes. Denies hearing loss, neck lumps or masses, mouth sores, ringing in ears or voice changes. Denies cough or wheezing.  Denies shortness of breath. Denies chest pain or palpitations. Denies leg swelling. Denies abdominal distention, pain, blood in stools, constipation, diarrhea, nausea, vomiting, or early satiety. Denies pain with intercourse, dysuria, frequency, hematuria or incontinence. Denies hot flashes, pelvic pain, vaginal bleeding or vaginal discharge.   Denies joint pain, back pain or muscle pain/cramps. Denies itching, rash, or wounds. Denies dizziness, headaches, numbness or seizures. Denies swollen lymph nodes or glands, denies easy bruising or  bleeding. Denies anxiety, depression, confusion, or decreased concentration.  Physical Exam: BP (!) 144/92 (BP Location: Left Arm, Patient Position: Sitting) Comment: RN notified  Pulse 77   Temp 98.6 F (37 C) (Tympanic)   Resp 16   Ht 5' 3" (1.6 m)   Wt 159 lb (72.1 kg)   SpO2 100% Comment: ra  BMI 28.17 kg/m  General: Alert, oriented, no acute distress. HEENT: Normocephalic, atraumatic, sclera anicteric. Chest: Unlabored breathing on room air. Abdomen: soft, nontender.  Normoactive bowel sounds.  No masses or hepatosplenomegaly appreciated.  Well-healing laparoscopic incisions. Extremities: Grossly normal range of motion.  Warm, well perfused.  No edema bilaterally. Skin: No rashes or lesions noted. GU: Normal appearing external genitalia without erythema, excoriation, or lesions.  Speculum exam reveals no bleeding or discharge, cuff intact, some suture still visible.  Bimanual exam reveals cuff intact.    Laboratory & Radiologic Studies: A. UTERUS, CERVIX, BILATERAL FALLOPIAN TUBES, HYSTERECTOMY WITH  SALPINGECTOMY:  - Cervix  Nabothian cyst.    No dysplasia or carcinoma.  - Endometrium    Secretory.    No hyperplasia or carcinoma.  - Myometrium    Unremarkable.    No evidence of malignancy.  - Bilateral Fallopian tubes    Unremarkable.    No endometriosis or malignancy.   Assessment & Plan: [name] is a 39 y.o. woman with a personal history of estrogen receptor negative breast cancer and Lynch syndrome with a PMS2 mutation now status post risk reducing hysterectomy/BS.  The patient is healing very well from surgery.  We discussed continued activity restrictions until she is 6-8 weeks out from surgery.  Given asymptomatic microscopic hematuria noted preoperatively on multiple urinalysis, we sent urine for cytology.  No malignant cells were seen.  Since she is now several weeks out from her hysterectomy, we will plan to repeat urinalysis today.  If  no evidence of infection and blood still noted, will refer to urology.  20 minutes of total time was spent for this patient encounter, including preparation, face-to-face counseling with the patient and coordination of care, and documentation of the encounter.  Jeral Pinch, MD  Division of Gynecologic Oncology  Department of Obstetrics and Gynecology  [facility]

## 2020-01-25 ENCOUNTER — Encounter: Payer: Self-pay | Admitting: Gynecologic Oncology

## 2020-01-25 DIAGNOSIS — R3121 Asymptomatic microscopic hematuria: Secondary | ICD-10-CM | POA: Diagnosis not present

## 2020-01-25 DIAGNOSIS — R350 Frequency of micturition: Secondary | ICD-10-CM | POA: Diagnosis not present

## 2020-01-27 ENCOUNTER — Encounter: Payer: BC Managed Care – PPO | Admitting: Gynecologic Oncology

## 2020-02-02 DIAGNOSIS — R3121 Asymptomatic microscopic hematuria: Secondary | ICD-10-CM | POA: Diagnosis not present

## 2020-02-02 DIAGNOSIS — R3129 Other microscopic hematuria: Secondary | ICD-10-CM | POA: Diagnosis not present

## 2020-02-08 DIAGNOSIS — R3121 Asymptomatic microscopic hematuria: Secondary | ICD-10-CM | POA: Diagnosis not present

## 2020-03-05 ENCOUNTER — Other Ambulatory Visit: Payer: Self-pay | Admitting: *Deleted

## 2020-03-05 ENCOUNTER — Encounter: Payer: Self-pay | Admitting: Hematology and Oncology

## 2020-03-05 MED ORDER — BUPROPION HCL ER (XL) 150 MG PO TB24
150.0000 mg | ORAL_TABLET | Freq: Every day | ORAL | 6 refills | Status: DC
Start: 2020-03-05 — End: 2020-09-17

## 2020-03-16 ENCOUNTER — Encounter: Payer: Self-pay | Admitting: Gastroenterology

## 2020-03-28 DIAGNOSIS — Z08 Encounter for follow-up examination after completed treatment for malignant neoplasm: Secondary | ICD-10-CM | POA: Diagnosis not present

## 2020-03-28 DIAGNOSIS — Z1509 Genetic susceptibility to other malignant neoplasm: Secondary | ICD-10-CM | POA: Diagnosis not present

## 2020-03-28 DIAGNOSIS — Z9013 Acquired absence of bilateral breasts and nipples: Secondary | ICD-10-CM | POA: Diagnosis not present

## 2020-03-28 DIAGNOSIS — Z853 Personal history of malignant neoplasm of breast: Secondary | ICD-10-CM | POA: Diagnosis not present

## 2020-04-01 NOTE — Assessment & Plan Note (Signed)
10/11/2018:Patient palpated a right breast mass, mammogram revealed 1.6 cm mass, biopsy revealed grade 3 invasive ductal carcinoma ER 0%, PR 0%, Ki-67 20%, HER-2 3+ positive, T1CN0 stage IA  Treatment plan: 1. Neoadjuvant Taxol and Herceptin weekly x12. Followed by Herceptin maintenance therapy every 3 weeks to complete 1 year.10/29/2018-01/26/2019 2.02/22/2019:Bilateral mastectomies [name]) Left breast: benign tissue and no evidence of malignancy Right breast: no residual carcinoma, 2 right axillary lymph nodes negative  Upbeat clinical trial: No adverse effects from participating in the trial Lynch syndrome positive on genetic testing (I discussed with her about aspirin 600 mg for prevention of colon cancer risk: Study published in [place]) ---------------------------------------------------------------------------------------------------------------------------------------- Depression/anxiety/panic attacks:On Wellbutrin Chemo-induced peripheral neuropathy:No further problems  Current treatment: Herceptin maintenance subcutaneously until 10/20/2019. Herceptin subcu toxicity: Injection site discomfort and bruising  Patient is going through a divorce and it is getting finalized later next month. She is looking forward to carrying on with her new life.  01/04/2020: Dr. Denman George hysterectomy.with BSO Return to clinic in 6 months for follow-up.

## 2020-04-01 NOTE — Progress Notes (Signed)
Patient Care Team: [name], [place] as PCP - General (Nurse Practitioner) [name], RN as Registered Nurse [name], MD as Consulting Physician (Gynecologic Oncology)  DIAGNOSIS:    ICD-10-CM   1. Malignant neoplasm of upper-outer quadrant of right breast in female, estrogen receptor negative ([place])  C50.411    Z17.1     SUMMARY OF ONCOLOGIC HISTORY: Oncology History  Malignant neoplasm of upper-outer quadrant of right breast in female, estrogen receptor negative ([place])  10/11/2018 Initial Diagnosis   Patient palpated a right breast mass, mammogram revealed 1.6 cm mass, biopsy revealed grade 3 invasive ductal carcinoma ER 0%, PR 0%, Ki-67 20%, HER-2 3+ positive, T1CN0 stage IA   10/19/2018 Cancer Staging   Staging form: Breast, AJCC 8th Edition - Clinical stage from 10/19/2018: Stage IA (cT1c, cN0, cM0, G3, ER-, PR-, HER2+) - Signed by [name], MD on 10/19/2018   10/29/2018 - 01/20/2019 Chemotherapy   The patient had trastuzumab (HERCEPTIN) 300 mg in sodium chloride 0.9 % 250 mL chemo infusion, 294 mg, Intravenous,  Once, 2 of 2 cycles Administration: 300 mg (10/29/2018), 150 mg (11/03/2018), 150 mg (12/02/2018), 150 mg (11/12/2018), 150 mg (11/25/2018), 150 mg (12/09/2018) PACLitaxel (TAXOL) 150 mg in sodium chloride 0.9 % 250 mL chemo infusion (</= 38m/m2), 80 mg/m2 = 150 mg, Intravenous,  Once, 3 of 3 cycles Dose modification: 65 mg/m2 (original dose 80 mg/m2, Cycle 3, Reason: Dose not tolerated) Administration: 150 mg (10/29/2018), 150 mg (11/03/2018), 150 mg (12/02/2018), 150 mg (11/12/2018), 150 mg (11/25/2018), 150 mg (12/09/2018), 150 mg (12/16/2018), 150 mg (12/23/2018), 120 mg (12/30/2018), 120 mg (01/06/2019), 120 mg (01/13/2019), 120 mg (01/20/2019) trastuzumab-dkst (OGIVRI) 150 mg in sodium chloride 0.9 % 250 mL chemo infusion, 150 mg (100 % of original dose 150 mg), Intravenous,  Once, 2 of 2 cycles Dose modification: 150 mg (original dose 150 mg, Cycle 2, Reason: Other  (see comments), Comment: Biosimilar Conversion), 450 mg (original dose 150 mg, Cycle 3, Reason: Other (see comments), Comment: Biosimilar Conversion) Administration: 150 mg (12/16/2018), 150 mg (12/23/2018), 150 mg (12/30/2018), 150 mg (01/06/2019), 150 mg (01/13/2019), 450 mg (01/20/2019)  for chemotherapy treatment.    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  The Common Hereditary Gene Panel offered by [facility] includes sequencing and/or deletion duplication testing of the following 48 genes: APC, ATM, AXIN2, BARD1, BMPR1A, BRCA1, BRCA2, BRIP1, CDH1, CDK4, CDKN2A (p14ARF), CDKN2A (p16INK4a), CHEK2, CTNNA1, DICER1, EPCAM (Deletion/duplication testing only), GREM1 (promoter region deletion/duplication testing only), KIT, MEN1, MLH1, MSH2, MSH3, MSH6, MUTYH, NBN, NF1, NHTL1, PALB2, PDGFRA, PMS2, POLD1, POLE, PTEN, RAD50, RAD51C, RAD51D, RNF43, SDHB, SDHC, SDHD, SMAD4, SMARCA4. STK11, TP53, TSC1, TSC2, and VHL.  The following genes were evaluated for sequence changes only: SDHA and HOXB13 c.251G>A variant only.   Two VUS's also found - one in ATM c.133C>T and the other in POLE c.1280C>T.  These will not affect medical management. The report date is November 12, 2018.   02/10/2019 -  Chemotherapy   The patient had trastuzumab-hyaluronidase-oysk (HERCEPTIN HYLECTA) 600-10000 MG-UNT/5ML chemo SQ injection 600 mg, 600 mg, Subcutaneous,  Once, 7 of 7 cycles Administration: 600 mg (06/16/2019), 600 mg (07/07/2019), 600 mg (07/28/2019), 600 mg (08/18/2019), 600 mg (09/08/2019), 600 mg (09/29/2019), 600 mg (10/20/2019) trastuzumab-dkst (OGIVRI) 462 mg in sodium chloride 0.9 % 250 mL chemo infusion, 6 mg/kg = 630 mg, Intravenous,  Once, 6 of 6 cycles Administration: 450 mg (03/03/2019), 450 mg (05/05/2019), 450  mg (05/26/2019), 450 mg (03/24/2019), 450 mg (04/14/2019)  for chemotherapy treatment.    02/22/2019 Surgery   Bilateral mastectomies  [name]) Left breast: benign tissue and no evidence of malignancy Right breast: no residual carcinoma, 2 right axillary lymph nodes negative      CHIEF COMPLIANT: Follow-up of Herceptin maintenance  INTERVAL HISTORY: DRUCILLA Farrell is a 39 y.o. with above-mentioned history of right breast cancerwho competedneoadjuvant chemotherapy,underwent bilateral mastectomies, and is currently on Herceptin maintenance.She is a participant in the UpBeat clinical trial.She presents to the clinic todayfor treatment.   ALLERGIES:  is allergic to methocarbamol.  MEDICATIONS:  Current Outpatient Medications  Medication Sig Dispense Refill  . buPROPion (WELLBUTRIN XL) 150 MG 24 hr tablet Take 1 tablet (150 mg total) by mouth daily. 30 tablet 6  . diazepam (VALIUM) 5 MG tablet Take 5 mg by mouth at bedtime as needed.     No current facility-administered medications for this visit.    PHYSICAL EXAMINATION: ECOG PERFORMANCE STATUS: 1 - Symptomatic but completely ambulatory  Vitals:   04/02/20 1412  BP: 122/83  Pulse: 70  Resp: 18  Temp: 97.7 F (36.5 C)  SpO2: 100%   Filed Weights   04/02/20 1412  Weight: 166 lb 11.2 oz (75.6 kg)    LABORATORY DATA:  I have reviewed the data as listed CMP Latest Ref Rng & Units 12/30/2019 07/07/2019 05/26/2019  Glucose 70 - 99 mg/dL 83 97 93  BUN 6 - 20 mg/dL _0 Creatinine 0.44 - 1.00 mg/dL 0.81 0.88 0.94  Sodium 135 - 145 mmol/L 139 143 142  Potassium 3.5 - 5.1 mmol/L 3.9 4.6 4.4  Chloride 98 - 111 mmol/L 101 108 109  CO2 22 - 32 mmol/L _1 Calcium 8.9 - 10.3 mg/dL 9.1 9.2 8.9  Total Protein 6.5 - 8.1 g/dL 7.4 7.0 6.9  Total Bilirubin 0.3 - 1.2 mg/dL 0.5 0.4 0.3  Alkaline Phos 38 - 126 U/L 62 70 67  AST 15 - 41 U/L 15 11(L) 15  ALT 0 - 44 U/L _2 Lab Results  Component Value Date   WBC 7.9 12/30/2019   HGB 14.1 12/30/2019   HCT 42.7 12/30/2019   MCV 95.7 12/30/2019   PLT 260 12/30/2019   NEUTROABS 4.0  07/07/2019    ASSESSMENT & PLAN:  Malignant neoplasm of upper-outer quadrant of right breast in female, estrogen receptor negative ([place]) 10/11/2018:Patient palpated a right breast mass, mammogram revealed 1.6 cm mass, biopsy revealed grade 3 invasive ductal carcinoma ER 0%, PR 0%, Ki-67 20%, HER-2 3+ positive, T1CN0 stage IA  Treatment plan: 1. Neoadjuvant Taxol and Herceptin weekly x12. Followed by Herceptin maintenance therapy every 3 weeks to complete 1 year.10/29/2018-01/26/2019, Herceptin maintenance subcutaneously until 10/20/2019 2.02/22/2019:Bilateral mastectomies [name]) Left breast: benign tissue and no evidence of malignancy Right breast: no residual carcinoma, 2 right axillary lymph nodes negative  Upbeat clinical trial: No adverse effects from participating in the trial Lynch syndrome positive on genetic testing (I discussed with her about aspirin 600 mg for prevention of colon cancer risk: Study published in [place]) ---------------------------------------------------------------------------------------------------------------------------------------- Depression/anxiety/panic attacks:On Wellbutrin Chemo-induced peripheral neuropathy: Mild symptoms on her shins. No further problems She and her husband got back together and she is very happy.  She was originally going through divorce process.  01/04/2020: Dr. Denman George hysterectomy.with BSO (profound nausea and vomiting post surgery) Return to clinic in 1 year for follow-up.  No orders of the  defined types were placed in this encounter.  The patient has a good understanding of the overall plan. she agrees with it. she will call with any problems that may develop before the next visit here.  Total time spent: 30 mins including face to face time and time spent for planning, charting and coordination of care  [name], MD 04/02/2020  I, Cloyde Reams Dorshimer, am acting as scribe for Dr. [name].  I have  reviewed the above documentation for accuracy and completeness, and I agree with the above.

## 2020-04-02 ENCOUNTER — Inpatient Hospital Stay: Payer: BC Managed Care – PPO | Attending: Hematology and Oncology | Admitting: Hematology and Oncology

## 2020-04-02 ENCOUNTER — Telehealth: Payer: Self-pay | Admitting: Hematology and Oncology

## 2020-04-02 ENCOUNTER — Other Ambulatory Visit: Payer: Self-pay

## 2020-04-02 DIAGNOSIS — C50411 Malignant neoplasm of upper-outer quadrant of right female breast: Secondary | ICD-10-CM | POA: Diagnosis not present

## 2020-04-02 DIAGNOSIS — Z9013 Acquired absence of bilateral breasts and nipples: Secondary | ICD-10-CM | POA: Diagnosis not present

## 2020-04-02 DIAGNOSIS — Z171 Estrogen receptor negative status [ER-]: Secondary | ICD-10-CM | POA: Diagnosis not present

## 2020-04-02 DIAGNOSIS — Z79899 Other long term (current) drug therapy: Secondary | ICD-10-CM | POA: Diagnosis not present

## 2020-04-02 NOTE — Telephone Encounter (Signed)
Scheduled appts per 11/29 los. Gave pt a print out of AVS.  

## 2020-04-18 ENCOUNTER — Ambulatory Visit: Payer: BC Managed Care – PPO | Admitting: *Deleted

## 2020-04-18 ENCOUNTER — Other Ambulatory Visit: Payer: Self-pay

## 2020-04-18 VITALS — Ht 63.0 in | Wt 163.0 lb

## 2020-04-18 DIAGNOSIS — Z1509 Genetic susceptibility to other malignant neoplasm: Secondary | ICD-10-CM

## 2020-04-18 NOTE — Progress Notes (Signed)
Sample Plenvu provided lot [id number] exp 04/22

## 2020-04-18 NOTE — Progress Notes (Addendum)
No egg or soy allergy known to patient  No issues with past sedation with any surgeries or procedures No intubation problems in the past  No FH of Malignant Hyperthermia No diet pills per patient No home 02 use per patient  No blood thinners per patient  Pt denies issues with constipation  No A fib or A flutter  EMMI video to pt or via MyChart  COVID 19 guidelines implemented in [facility] today with Pt and RN    Due to the COVID-19 pandemic we are asking patients to follow certain guidelines.  Pt aware of COVID protocols and [facility] guidelines   

## 2020-04-18 NOTE — Progress Notes (Signed)
Left message with [place] [facility] pre-screening dept to schedule pt for Covid screen 04/30/20

## 2020-04-20 ENCOUNTER — Encounter: Payer: Self-pay | Admitting: Gastroenterology

## 2020-04-30 ENCOUNTER — Other Ambulatory Visit
Admission: RE | Admit: 2020-04-30 | Discharge: 2020-04-30 | Disposition: A | Discharge status: Home / Self Care | Payer: BC Managed Care – PPO | Source: Ambulatory Visit | Attending: Gastroenterology | Admitting: Gastroenterology

## 2020-04-30 ENCOUNTER — Other Ambulatory Visit: Payer: Self-pay

## 2020-04-30 DIAGNOSIS — U071 COVID-19: Secondary | ICD-10-CM | POA: Insufficient documentation

## 2020-04-30 DIAGNOSIS — Z01812 Encounter for preprocedural laboratory examination: Secondary | ICD-10-CM | POA: Diagnosis not present

## 2020-05-01 LAB — SARS CORONAVIRUS 2 (TAT 6-24 HRS): SARS Coronavirus 2: POSITIVE — AB

## 2020-05-02 ENCOUNTER — Encounter: Payer: BC Managed Care – PPO | Admitting: Gastroenterology

## 2020-06-12 ENCOUNTER — Encounter: Payer: Self-pay | Admitting: Gastroenterology

## 2020-06-12 ENCOUNTER — Other Ambulatory Visit: Payer: Self-pay

## 2020-06-12 ENCOUNTER — Ambulatory Visit (AMBULATORY_SURGERY_CENTER): Payer: BC Managed Care – PPO | Admitting: Gastroenterology

## 2020-06-12 VITALS — BP 163/88 | HR 81 | Temp 97.1°F | Resp 15 | Ht 63.0 in | Wt 163.0 lb

## 2020-06-12 DIAGNOSIS — Z1509 Genetic susceptibility to other malignant neoplasm: Secondary | ICD-10-CM

## 2020-06-12 DIAGNOSIS — Z1211 Encounter for screening for malignant neoplasm of colon: Secondary | ICD-10-CM | POA: Diagnosis not present

## 2020-06-12 MED ORDER — SODIUM CHLORIDE 0.9 % IV SOLN: 500.0000 mL | Freq: Once | INTRAVENOUS | Status: DC

## 2020-06-12 NOTE — Progress Notes (Signed)
A and O x3. Report to RN. Tolerated MAC anesthesia well.

## 2020-06-12 NOTE — Patient Instructions (Signed)
Repeat colonoscopy in 1 year for screening.   YOU HAD AN ENDOSCOPIC PROCEDURE TODAY AT [facility]:   Refer to the procedure report that was given to you for any specific questions about what was found during the examination.  If the procedure report does not answer your questions, please call your gastroenterologist to clarify.  If you requested that your care partner not be given the details of your procedure findings, then the procedure report has been included in a sealed envelope for you to review at your convenience later.  YOU SHOULD EXPECT: Some feelings of bloating in the abdomen. Passage of more gas than usual.  Walking can help get rid of the air that was put into your GI tract during the procedure and reduce the bloating. If you had a lower endoscopy (such as a colonoscopy or flexible sigmoidoscopy) you may notice spotting of blood in your stool or on the toilet paper. If you underwent a bowel prep for your procedure, you may not have a normal bowel movement for a few days.  Please Note:  You might notice some irritation and congestion in your nose or some drainage.  This is from the oxygen used during your procedure.  There is no need for concern and it should clear up in a day or so.  SYMPTOMS TO REPORT IMMEDIATELY:   Following lower endoscopy (colonoscopy or flexible sigmoidoscopy):  Excessive amounts of blood in the stool  Significant tenderness or worsening of abdominal pains  Swelling of the abdomen that is new, acute  Fever of 100F or higher   For urgent or emergent issues, a gastroenterologist can be reached at any hour by calling [phone]. Do not use MyChart messaging for urgent concerns.    DIET:  We do recommend a small meal at first, but then you may proceed to your regular diet.  Drink plenty of fluids but you should avoid alcoholic beverages for 24 hours.  ACTIVITY:  You should plan to take it easy for the rest of today and you should NOT  DRIVE or use heavy machinery until tomorrow (because of the sedation medicines used during the test).    FOLLOW UP: Our staff will call the number listed on your records 48-72 hours following your procedure to check on you and address any questions or concerns that you may have regarding the information given to you following your procedure. If we do not reach you, we will leave a message.  We will attempt to reach you two times.  During this call, we will ask if you have developed any symptoms of COVID 19. If you develop any symptoms (ie: fever, flu-like symptoms, shortness of breath, cough etc.) before then, please call [phone].  If you test positive for Covid 19 in the 2 weeks post procedure, please call and report this information to [place].    If any biopsies were taken you will be contacted by phone or by letter within the next 1-3 weeks.  Please call us at [phone] if you have not heard about the biopsies in 3 weeks.    SIGNATURES/CONFIDENTIALITY: You and/or your care partner have signed paperwork which will be entered into your electronic medical record.  These signatures attest to the fact that that the information above on your After Visit Summary has been reviewed and is understood.  Full responsibility of the confidentiality of this discharge information lies with you and/or your care-partner.

## 2020-06-12 NOTE — Op Note (Addendum)
[facility] Patient Name: [name] Procedure Date: 06/12/2020 9:17 AM MRN: [id number] Endoscopist: Milus Banister , MD Age: 40 Referring MD:  Date of Birth: Aug 04, 1980 Gender: Female Account #: [id number] Procedure:                Colonoscopy Indications:              Colon cancer screening in patient at increased                            risk: PMS2 Related Lynch Syndrome; colonoscopy                            02/2019 was normal Medicines:                Monitored Anesthesia Care Procedure:                Pre-Anesthesia Assessment:                           - Prior to the procedure, a History and Physical                            was performed, and patient medications and                            allergies were reviewed. The patient's tolerance of                            previous anesthesia was also reviewed. The risks                            and benefits of the procedure and the sedation                            options and risks were discussed with the patient.                            All questions were answered, and informed consent                            was obtained. Prior Anticoagulants: The patient has                            taken no previous anticoagulant or antiplatelet                            agents. ASA Grade Assessment: II - A patient with                            mild systemic disease. After reviewing the risks                            and benefits, the patient was deemed in  satisfactory condition to undergo the procedure.                           After obtaining informed consent, the colonoscope                            was passed under direct vision. Throughout the                            procedure, the patient's blood pressure, pulse, and                            oxygen saturations were monitored continuously. The                            Colonoscope was introduced through the anus and                             advanced to the the cecum, identified by                            appendiceal orifice and ileocecal valve. The                            colonoscopy was performed without difficulty. The                            patient tolerated the procedure well. The quality                            of the bowel preparation was good. The ileocecal                            valve, appendiceal orifice, and rectum were                            photographed. Scope In: 9:26:09 AM Scope Out: 9:35:10 AM Scope Withdrawal Time: 0 hours 6 minutes 54 seconds  Total Procedure Duration: 0 hours 9 minutes 1 second  Findings:                 The entire examined colon appeared normal on direct                            and retroflexion views. Complications:            No immediate complications. Estimated blood loss:                            None. Estimated Blood Loss:     Estimated blood loss: none. Impression:               - The entire examined colon is normal on direct and                            retroflexion views.                           -  No polyps or cancers. Recommendation:           - Patient has a contact number available for                            emergencies. The signs and symptoms of potential                            delayed complications were discussed with the                            patient. Return to normal activities tomorrow.                            Written discharge instructions were provided to the                            patient.                           - Resume previous diet.                           - Continue present medications.                           - Repeat colonoscopy in 1 year for screening. Milus Banister, MD 06/12/2020 9:37:28 AM This report has been signed electronically.

## 2020-06-14 ENCOUNTER — Telehealth: Payer: Self-pay

## 2020-06-14 NOTE — Telephone Encounter (Signed)
  Follow up Call-  Call back number 06/12/2020 02/04/2019  Post procedure Call Back phone  # [phone]  Permission to leave phone message Yes Yes  Some recent data might be hidden     Patient questions:  Do you have a fever, pain , or abdominal swelling? No. Pain Score  0 *  Have you tolerated food without any problems? Yes.    Have you been able to return to your normal activities? Yes.    Do you have any questions about your discharge instructions: Diet   No. Medications  No. Follow up visit  No.  Do you have questions or concerns about your Care? No.  Actions: * If pain score is 4 or above: No action needed, pain <4. 1. Have you developed a fever since your procedure? no  2.   Have you had an respiratory symptoms (SOB or cough) since your procedure? no  3.   Have you tested positive for COVID 19 since your procedure no  4.   Have you had any family members/close contacts diagnosed with the COVID 19 since your procedure?  no   If yes to any of these questions please route to [name], RN and Joella Prince, RN

## 2020-09-03 ENCOUNTER — Telehealth: Payer: Self-pay

## 2020-09-03 DIAGNOSIS — Z171 Estrogen receptor negative status [ER-]: Secondary | ICD-10-CM

## 2020-09-03 DIAGNOSIS — C50411 Malignant neoplasm of upper-outer quadrant of right female breast: Secondary | ICD-10-CM

## 2020-09-03 NOTE — Telephone Encounter (Signed)
UPBEAT [id number] - UNDERSTANDING and PREDICTING BREAST CANCER EVENTS AFTER TREATMENT  INCOMING CALL: Incoming return call from [place]: we spoke for approximately five minutes. I advised [name] that her 48-month UPBEAT visit is due anytime between now and the end of August, requesting which dates/times work best for her since she currently does not have any visits to the cancer center. She is off Friday, 09/14/20 and would like to complete all assessments that day if possible. The MRI schedulers for cardiac MRI are gone for the day: I will contact them tomorrow to try and schedule the cMRI as requested. [name] is also notified of a new 4-month questionnaire added to the UPBEAT study and the option to complete online. The purpose of the study is explained and [name] verbalizes interest in completing study questionnaires online and verifies her email as the address we have on file.   [name] is also advised that her new clinical research contact is [employer]. I will email [name]'s direct contact information when I email Shirel about her appointment (per her request). I inform [name] to contact me through 09/14/20 for any needs or questions and she verbalizes understanding. [name] is thanked for her time and continued participation in the UPBEAT study. Current plan is to contact the MRI department for cMRI availability and email [name] with a cMRI and research appointment time.   Dionne Bucy. Sharlett Iles, BSN, RN, CIC 09/03/2020 4:37 PM

## 2020-09-03 NOTE — Telephone Encounter (Signed)
UPBEAT [id number] - UNDERSTANDING and PREDICTING BREAST CANCER EVENTS AFTER TREATMENT  OUTGOING CALL: Outgoing call to schedule 40-month UPBEAT appointment. [name] is at work currently and unable to talk; she states she will return my call this afternoon sometime after 3:30pm. My direct phone number is provided and Armilda is thanked for her time.  Dionne Bucy. Sharlett Iles, BSN, RN, CIC 09/03/2020 11:58 AM

## 2020-09-04 ENCOUNTER — Other Ambulatory Visit: Payer: Self-pay

## 2020-09-04 ENCOUNTER — Other Ambulatory Visit (HOSPITAL_COMMUNITY): Payer: Self-pay | Admitting: Hematology and Oncology

## 2020-09-04 DIAGNOSIS — C50411 Malignant neoplasm of upper-outer quadrant of right female breast: Secondary | ICD-10-CM

## 2020-09-04 DIAGNOSIS — C50919 Malignant neoplasm of unspecified site of unspecified female breast: Secondary | ICD-10-CM

## 2020-09-04 DIAGNOSIS — Z171 Estrogen receptor negative status [ER-]: Secondary | ICD-10-CM

## 2020-09-05 ENCOUNTER — Telehealth: Payer: Self-pay | Admitting: Hematology and Oncology

## 2020-09-05 NOTE — Telephone Encounter (Signed)
Scheduled appt per 5/3 sch msg. Pt aware.  

## 2020-09-13 ENCOUNTER — Telehealth: Payer: Self-pay | Admitting: *Deleted

## 2020-09-13 NOTE — Telephone Encounter (Signed)
Called patient and LVM reminding her of scheduled appointments 09/14/20, fast at least 3 hours prior to blood draw, wear comfortable clothing and walking shoes for physical assessment, and bring a snack if desired. Also reminded patient of her MRI appointment at 12:00pm  and will need to arrive 11:30am

## 2020-09-14 ENCOUNTER — Inpatient Hospital Stay: Payer: BC Managed Care – PPO | Attending: Hematology and Oncology | Admitting: Radiology

## 2020-09-14 ENCOUNTER — Other Ambulatory Visit: Payer: Self-pay

## 2020-09-14 ENCOUNTER — Ambulatory Visit (HOSPITAL_COMMUNITY)
Admission: RE | Admit: 2020-09-14 | Discharge: 2020-09-14 | Disposition: A | Discharge status: Home / Self Care | Payer: Self-pay | Source: Ambulatory Visit | Attending: Hematology and Oncology | Admitting: Hematology and Oncology

## 2020-09-14 ENCOUNTER — Inpatient Hospital Stay: Payer: BC Managed Care – PPO

## 2020-09-14 DIAGNOSIS — C50919 Malignant neoplasm of unspecified site of unspecified female breast: Secondary | ICD-10-CM | POA: Insufficient documentation

## 2020-09-14 DIAGNOSIS — Z171 Estrogen receptor negative status [ER-]: Secondary | ICD-10-CM

## 2020-09-14 DIAGNOSIS — C50411 Malignant neoplasm of upper-outer quadrant of right female breast: Secondary | ICD-10-CM

## 2020-09-14 LAB — RESEARCH LABS

## 2020-09-14 NOTE — Research (Addendum)
UPBEAT [id number] - UNDERSTANDING and PREDICTING BREAST CANCER EVENTS AFTER TREATMENT  09/14/2020 (28-month visit)  [name] arrives to the cancer center today for her 32-month UPBEAT study visit. Per [name]'s request, labs were collected upon arrival and UPBEAT physical function tests and neurocognitive testing were complete prior to her MRI.  10:00AM LABS: Labs collected per protocol for the 2-month timepoint: [name] tolerated well without complaint.  QUESTIONNAIRES: Questionnaires were completed by [name] online per protocol.   10:19AM VITAL SIGNS: Following her lab appointment, vital signs were collected following the UPBEAT protocol by Teresa Im, RN, Clinical Research Nurse and recorded by this clinical research coordinator, including height and weight. [name] was seated comfortably for five minutes prior to acquisition with a one-minute interval between the readings.   10:27AM WAIST MEASUREMENT: Once [name] arrived to the research department, a waist measurement was collected.   10:28AM NEUROCOGNITIVE TESTS: After vital signs, neurocognitive tests were completed by this clinical research coordinator in the Research Audit Room inside the [facility] department.   10:40AM MEDS/CARDIOVASCULAR EVENT/EMAIL REVIEW: After the neurocognitive tests, [name] had the opportunity to review her current medication list and verifies she currently takes the same one medication with no changes to her current list. Regarding cardiovascular events, [name] denies any CV events since her last visit.   10:45AM PHYSICAL ASSESSMENT: The remaining study physical assessments were completed by this clinical research coordinator per protocol with Teresa Im, RN, Clinical Research Nurse as a scribe; [name] was able to perform all assessments independently and without difficulty or complaint.  11:05AM CARDIAC MRI: Patient was walked over to the MRI department by [name], [name] Assistant.    11:30AM GIFT CARD: Upon completion of all 83-month activities, a $25 gift card was provided for her participation.  FUTURE VISITS: [name] was informed that all future contacts will be yearly phone calls to assess any cardiac events as well as e-mailed questionnaires. [name] expressed understanding and had no further questions at this time. She was encouraged to call with any questions or concerns that may present in the future. [name] was thanked for her time and continued participation in the above mentioned trial.  [name], RT(R)(T) Clinical Research Coordinator  09/18/20  PI REVIEW FOR LAB RESULTS: Reviewed lab results with Dr. Lindi Adie, [place] signed and dated results and verified they no clinical significance. Patient was notified of lab results.  [name], RT(R)(T) Clinical Research Coordinator

## 2020-09-17 ENCOUNTER — Encounter: Payer: Self-pay | Admitting: Hematology and Oncology

## 2020-09-17 ENCOUNTER — Other Ambulatory Visit: Payer: Self-pay

## 2020-09-17 MED ORDER — BUPROPION HCL ER (XL) 150 MG PO TB24
150.0000 mg | ORAL_TABLET | Freq: Every day | ORAL | 3 refills | Status: DC
Start: 2020-09-17 — End: 2021-01-31

## 2020-09-18 ENCOUNTER — Telehealth: Payer: Self-pay | Admitting: Radiology

## 2020-09-18 NOTE — Telephone Encounter (Signed)
WF [id number] Understanding and Predicting Breast Cancer Events after Treatment (UPBEAT)  09/18/20   11:05AM  PHONE CALL: Confirmed I was speaking with Drue Dun. Informed patient reason for call was to go over lab results from the above mentioned study. All results were within normal limits, except for a mild elevation in Triglycerides reading. Patient informed of all results and will be mailed a copy. Patient was thanked for her time and had no additional questions at this time.  [name], RT(R)(T) Clinical Research Coordinator

## 2020-09-20 ENCOUNTER — Telehealth: Payer: Self-pay | Admitting: Radiology

## 2020-09-20 NOTE — Telephone Encounter (Signed)
WF [id number] Understanding and Predicting Breast Cancer Events after Treatment (UPBEAT)  09/20/2020    11:52AM  PHONE CALL: Confirmed I was speaking with Drue Dun. Informed patient reason for call was to discuss cardiac MRI results for the above mentioned study. Informed patient there were no concerning findings. Patient thanked me for the call and expressed understanding. Patient was thanked for her time and continued participation in the above mentioned study.   [name], RT(R)(T) Clinical Research Coordinator

## 2020-12-12 ENCOUNTER — Encounter: Payer: Self-pay | Admitting: Hematology and Oncology

## 2021-01-31 ENCOUNTER — Encounter: Payer: Self-pay | Admitting: Hematology and Oncology

## 2021-01-31 ENCOUNTER — Other Ambulatory Visit: Payer: Self-pay | Admitting: *Deleted

## 2021-01-31 MED ORDER — BUPROPION HCL ER (XL) 150 MG PO TB24
150.0000 mg | ORAL_TABLET | Freq: Every day | ORAL | 3 refills | Status: DC
Start: 2021-01-31 — End: 2022-03-10

## 2021-02-07 ENCOUNTER — Other Ambulatory Visit: Payer: Self-pay | Admitting: Hematology and Oncology

## 2021-02-15 ENCOUNTER — Other Ambulatory Visit: Payer: Self-pay | Admitting: Gynecologic Oncology

## 2021-02-15 ENCOUNTER — Telehealth: Payer: Self-pay | Admitting: *Deleted

## 2021-02-15 ENCOUNTER — Inpatient Hospital Stay: Payer: BC Managed Care – PPO | Attending: Hematology and Oncology

## 2021-02-15 ENCOUNTER — Encounter: Payer: Self-pay | Admitting: Hematology and Oncology

## 2021-02-15 ENCOUNTER — Other Ambulatory Visit: Payer: Self-pay

## 2021-02-15 ENCOUNTER — Telehealth: Payer: Self-pay

## 2021-02-15 DIAGNOSIS — R300 Dysuria: Secondary | ICD-10-CM

## 2021-02-15 DIAGNOSIS — C50411 Malignant neoplasm of upper-outer quadrant of right female breast: Secondary | ICD-10-CM | POA: Diagnosis not present

## 2021-02-15 DIAGNOSIS — Z79899 Other long term (current) drug therapy: Secondary | ICD-10-CM | POA: Insufficient documentation

## 2021-02-15 DIAGNOSIS — Z171 Estrogen receptor negative status [ER-]: Secondary | ICD-10-CM | POA: Insufficient documentation

## 2021-02-15 DIAGNOSIS — Z1509 Genetic susceptibility to other malignant neoplasm: Secondary | ICD-10-CM

## 2021-02-15 DIAGNOSIS — R3129 Other microscopic hematuria: Secondary | ICD-10-CM

## 2021-02-15 DIAGNOSIS — N3001 Acute cystitis with hematuria: Secondary | ICD-10-CM

## 2021-02-15 DIAGNOSIS — M25559 Pain in unspecified hip: Secondary | ICD-10-CM

## 2021-02-15 LAB — URINALYSIS, COMPLETE (UACMP) WITH MICROSCOPIC
Bilirubin Urine: NEGATIVE
Glucose, UA: NEGATIVE mg/dL
Ketones, ur: NEGATIVE mg/dL
Nitrite: NEGATIVE
Protein, ur: 30 mg/dL — AB
RBC / HPF: >50 RBC/hpf — ABNORMAL HIGH (ref 0–5)
Specific Gravity, Urine: 1.01 (ref 1.005–1.030)
pH: 5 (ref 5.0–8.0)

## 2021-02-15 MED ORDER — NITROFURANTOIN MONOHYD MACRO 100 MG PO CAPS: 100.0000 mg | ORAL_CAPSULE | Freq: Two times a day (BID) | ORAL | 0 refills | Status: DC

## 2021-02-15 NOTE — Telephone Encounter (Signed)
Patient called and stated "I am having UTI symptoms and pelvic pain. I had surgery with Dr Berline Lopes on 01/04/2020. The pelvic pain on the right side and goes to my back; it is a burning pain. The pain level is around a 5. Ibuprofen helps with the pain. I have no abdominal swelling. I do have a fullness when I eat. This all started Monday. I tried azo with no relief, so last night a did an OTC UTI test and it was positive. I do have burning with urination and frequency." Explained to the patient that Dr Berline Lopes and Lenna Sciara APP are in the office with clinic and the message would be given to them and someone from the office will call her back. Patient did state she can come in the office today if needed for a visit or lab

## 2021-02-15 NOTE — Telephone Encounter (Signed)
Spoke with [name] and reviewed urinalysis results. Per [name], NP analysis looks like an infection. Urine culture will take a few days. Can start macrobid now with the knowledge this may have to be changed if culture shows better antibiotic to treat it with. NP would like to repeat UA after antibiotics are completed to check if there is still blood in urine. If its still there patient will need a referral to urology. Ultrasound has been scheduled for 02/20/21 at 2:30pm at [facility]. Patient is in agreement of date and time of appointment. Patient verbalizes understanding to above information. Instructed to call our office with any questions or concerns.

## 2021-02-15 NOTE — Progress Notes (Signed)
See RN note.

## 2021-02-15 NOTE — Telephone Encounter (Signed)
Spoke with Ms. Straw and informed her that Dr. Berline Lopes would like to order an ultrasound. If patient would prefer she can have her urine check at her GYN or PCP office. Patient would prefer to have her urine checked at the cancer center. Lab appointment scheduled for today at 11am. Patient is in agreement of date and time of appointment. Will contact patient once ultrasound has been scheduled. Patient verbalizes understanding.

## 2021-02-17 LAB — URINE CULTURE: Culture: >=100000 — AB

## 2021-02-18 ENCOUNTER — Other Ambulatory Visit: Payer: Self-pay | Admitting: *Deleted

## 2021-02-18 ENCOUNTER — Telehealth: Payer: Self-pay

## 2021-02-18 DIAGNOSIS — Z171 Estrogen receptor negative status [ER-]: Secondary | ICD-10-CM

## 2021-02-18 NOTE — Telephone Encounter (Signed)
Spoke with [name] regarding her urine culture results. Macrobid is sensitive to the infection and we will not need to change her antibiotics. Patient verbalized understanding. She states her symptoms are improving, the pain is not as sharp and is more diffuse. She denies fever or chills. She states " I just feel tired and drained." Encouraged patient to rest and to drink plenty of fluids.  Patient is scheduled for ultrasound on 02/20/21. Per [name], NP instructed if symptoms resolve then ultrasound can be cancelled. Patient will call back on Tuesday or Wednesday if she decides to cancel.  Lab appointment scheduled to recheck urine on 02/20/21 at 4pm. Patient agrees to date and time of appt. Instructed to call with any questions or concerns.

## 2021-02-20 ENCOUNTER — Inpatient Hospital Stay: Payer: BC Managed Care – PPO

## 2021-02-20 ENCOUNTER — Other Ambulatory Visit: Payer: Self-pay

## 2021-02-20 ENCOUNTER — Ambulatory Visit (HOSPITAL_COMMUNITY): Payer: BC Managed Care – PPO

## 2021-02-20 DIAGNOSIS — Z1509 Genetic susceptibility to other malignant neoplasm: Secondary | ICD-10-CM

## 2021-02-20 DIAGNOSIS — C50411 Malignant neoplasm of upper-outer quadrant of right female breast: Secondary | ICD-10-CM

## 2021-02-20 DIAGNOSIS — R3129 Other microscopic hematuria: Secondary | ICD-10-CM

## 2021-02-20 DIAGNOSIS — Z79899 Other long term (current) drug therapy: Secondary | ICD-10-CM | POA: Diagnosis not present

## 2021-02-20 DIAGNOSIS — Z171 Estrogen receptor negative status [ER-]: Secondary | ICD-10-CM | POA: Diagnosis not present

## 2021-02-20 LAB — URINALYSIS, COMPLETE (UACMP) WITH MICROSCOPIC
Bacteria, UA: NONE SEEN
Bilirubin Urine: NEGATIVE
Glucose, UA: NEGATIVE mg/dL
Ketones, ur: NEGATIVE mg/dL
Leukocytes,Ua: NEGATIVE
Nitrite: NEGATIVE
Protein, ur: NEGATIVE mg/dL
Specific Gravity, Urine: 1.002 — ABNORMAL LOW (ref 1.005–1.030)
pH: 6 (ref 5.0–8.0)

## 2021-02-20 LAB — CMP (CANCER CENTER ONLY)
ALT: 12 U/L (ref 0–44)
AST: 14 U/L — ABNORMAL LOW (ref 15–41)
Albumin: 4.3 g/dL (ref 3.5–5.0)
Alkaline Phosphatase: 63 U/L (ref 38–126)
Anion gap: 13 (ref 5–15)
BUN: 14 mg/dL (ref 6–20)
CO2: 25 mmol/L (ref 22–32)
Calcium: 9.7 mg/dL (ref 8.9–10.3)
Chloride: 102 mmol/L (ref 98–111)
Creatinine: 0.83 mg/dL (ref 0.44–1.00)
GFR, Estimated: >60 mL/min (ref >60)
Glucose, Bld: 91 mg/dL (ref 70–99)
Potassium: 4.2 mmol/L (ref 3.5–5.1)
Sodium: 140 mmol/L (ref 135–145)
Total Bilirubin: 0.3 mg/dL (ref 0.3–1.2)
Total Protein: 7.6 g/dL (ref 6.5–8.1)

## 2021-02-20 LAB — CBC WITH DIFFERENTIAL (CANCER CENTER ONLY)
Abs Immature Granulocytes: 0.01 10*3/uL (ref 0.00–0.07)
Basophils Absolute: 0 10*3/uL (ref 0.0–0.1)
Basophils Relative: 0 %
Eosinophils Absolute: 0 10*3/uL (ref 0.0–0.5)
Eosinophils Relative: 1 %
HCT: 40.5 % (ref 36.0–46.0)
Hemoglobin: 13.9 g/dL (ref 12.0–15.0)
Immature Granulocytes: 0 %
Lymphocytes Relative: 26 %
Lymphs Abs: 2 10*3/uL (ref 0.7–4.0)
MCH: 32 pg (ref 26.0–34.0)
MCHC: 34.3 g/dL (ref 30.0–36.0)
MCV: 93.3 fL (ref 80.0–100.0)
Monocytes Absolute: 0.5 10*3/uL (ref 0.1–1.0)
Monocytes Relative: 7 %
Neutro Abs: 5.1 10*3/uL (ref 1.7–7.7)
Neutrophils Relative %: 66 %
Platelet Count: 248 10*3/uL (ref 150–400)
RBC: 4.34 MIL/uL (ref 3.87–5.11)
RDW: 11.9 % (ref 11.5–15.5)
WBC Count: 7.6 10*3/uL (ref 4.0–10.5)
nRBC: 0 % (ref 0.0–0.2)

## 2021-02-21 ENCOUNTER — Telehealth: Payer: Self-pay

## 2021-02-21 NOTE — Telephone Encounter (Signed)
Recent urinalysis lab results successfully faxed to [facility].

## 2021-02-21 NOTE — Telephone Encounter (Signed)
Spoke with [name] regarding her urinalysis results. Urine showing moderate hemoglobin. [name], NP would like to recheck patients urine in 2 weeks. Patient states "I have an appointment with [facility] on November 15th is it ok to have it checked there?" NP notified and ok with this. Recent urinalysis results will be faxed to [facility]. Patient verbalized understanding and will call with any questions or concerns.

## 2021-03-05 ENCOUNTER — Encounter: Payer: Self-pay | Admitting: Hematology and Oncology

## 2021-03-08 ENCOUNTER — Encounter: Payer: Self-pay | Admitting: Hematology and Oncology

## 2021-03-11 ENCOUNTER — Encounter: Payer: Self-pay | Admitting: Hematology and Oncology

## 2021-03-18 ENCOUNTER — Encounter: Payer: Self-pay | Admitting: Hematology and Oncology

## 2021-03-19 DIAGNOSIS — B9620 Unspecified Escherichia coli [E. coli] as the cause of diseases classified elsewhere: Secondary | ICD-10-CM | POA: Diagnosis not present

## 2021-03-19 DIAGNOSIS — R3121 Asymptomatic microscopic hematuria: Secondary | ICD-10-CM | POA: Diagnosis not present

## 2021-03-19 DIAGNOSIS — N390 Urinary tract infection, site not specified: Secondary | ICD-10-CM | POA: Diagnosis not present

## 2021-03-27 DIAGNOSIS — Z08 Encounter for follow-up examination after completed treatment for malignant neoplasm: Secondary | ICD-10-CM | POA: Diagnosis not present

## 2021-03-27 DIAGNOSIS — Z1509 Genetic susceptibility to other malignant neoplasm: Secondary | ICD-10-CM | POA: Diagnosis not present

## 2021-03-27 DIAGNOSIS — Z853 Personal history of malignant neoplasm of breast: Secondary | ICD-10-CM | POA: Diagnosis not present

## 2021-03-27 DIAGNOSIS — Z9013 Acquired absence of bilateral breasts and nipples: Secondary | ICD-10-CM | POA: Diagnosis not present

## 2021-04-10 ENCOUNTER — Ambulatory Visit: Payer: BC Managed Care – PPO | Admitting: Hematology and Oncology

## 2021-04-14 NOTE — Assessment & Plan Note (Signed)
negative ([place]) 10/11/2018:Patient palpated a right breast mass, mammogram revealed 1.6 cm mass, biopsy revealed grade 3 invasive ductal carcinoma ER 0%, PR 0%, Ki-67 20%, HER-2 3+ positive, T1CN0 stage IA  Treatment plan: 1. Neoadjuvant Taxol and Herceptin weekly x12. Followed by Herceptin maintenance therapy every 3 weeks to complete 1 year.10/29/2018-01/26/2019, Herceptin maintenance subcutaneously until 10/20/2019 2.02/22/2019:Bilateral mastectomies [name] & [name]) Left breast: benign tissue and no evidence of malignancy Right breast: no residual carcinoma, 2 right axillary lymph nodes negative  Upbeat clinical trial: No adverse effects from participating in the trial Lynch syndrome positive on genetic testing (I discussed with her about aspirin 600 mg for prevention of colon cancer risk: Study published in [place]) ---------------------------------------------------------------------------------------------------------------------------------------- Depression/anxiety/panic attacks:On Wellbutrin Chemo-induced peripheral neuropathy: Mild symptoms on her shins. No further problems She and her husband got back together and she is very happy.  She was originally going through divorce process.  01/04/2020: Dr. Denman George hysterectomy.with BSO (profound nausea and vomiting post surgery)  Breast Cancer surveillance: 1. Mammograms: not indicated since she had bil mastectomies 2. Breast Exam: Benign Return to clinic in 1 year for follow-up.

## 2021-04-14 NOTE — Progress Notes (Signed)
Patient Care Team: [name], [place] as PCP - General (Nurse Practitioner) [name], RN (Inactive) as Registered Nurse [name], MD as Consulting Physician (Gynecologic Oncology)  DIAGNOSIS:    ICD-10-CM   1. Malignant neoplasm of upper-outer quadrant of right breast in female, estrogen receptor negative ([place])  C50.411    Z17.1       SUMMARY OF ONCOLOGIC HISTORY: Oncology History  Malignant neoplasm of upper-outer quadrant of right breast in female, estrogen receptor negative ([place])  10/11/2018 Initial Diagnosis   Patient palpated a right breast mass, mammogram revealed 1.6 cm mass, biopsy revealed grade 3 invasive ductal carcinoma ER 0%, PR 0%, Ki-67 20%, HER-2 3+ positive, T1CN0 stage IA   10/19/2018 Cancer Staging   Staging form: Breast, AJCC 8th Edition - Clinical stage from 10/19/2018: Stage IA (cT1c, cN0, cM0, G3, ER-, PR-, HER2+) - Signed by [name], MD on 10/19/2018    10/29/2018 - 01/20/2019 Chemotherapy   The patient had trastuzumab (HERCEPTIN) 300 mg in sodium chloride 0.9 % 250 mL chemo infusion, 294 mg, Intravenous,  Once, 2 of 2 cycles Administration: 300 mg (10/29/2018), 150 mg (11/03/2018), 150 mg (12/02/2018), 150 mg (11/12/2018), 150 mg (11/25/2018), 150 mg (12/09/2018) PACLitaxel (TAXOL) 150 mg in sodium chloride 0.9 % 250 mL chemo infusion (</= 31m/m2), 80 mg/m2 = 150 mg, Intravenous,  Once, 3 of 3 cycles Dose modification: 65 mg/m2 (original dose 80 mg/m2, Cycle 3, Reason: Dose not tolerated) Administration: 150 mg (10/29/2018), 150 mg (11/03/2018), 150 mg (12/02/2018), 150 mg (11/12/2018), 150 mg (11/25/2018), 150 mg (12/09/2018), 150 mg (12/16/2018), 150 mg (12/23/2018), 120 mg (12/30/2018), 120 mg (01/06/2019), 120 mg (01/13/2019), 120 mg (01/20/2019) trastuzumab-dkst (OGIVRI) 150 mg in sodium chloride 0.9 % 250 mL chemo infusion, 150 mg (100 % of original dose 150 mg), Intravenous,  Once, 2 of 2 cycles Dose modification: 150 mg (original dose 150 mg, Cycle 2,  Reason: Other (see comments), Comment: Biosimilar Conversion), 450 mg (original dose 150 mg, Cycle 3, Reason: Other (see comments), Comment: Biosimilar Conversion) Administration: 150 mg (12/16/2018), 150 mg (12/23/2018), 150 mg (12/30/2018), 150 mg (01/06/2019), 150 mg (01/13/2019), 450 mg (01/20/2019)   for chemotherapy treatment.     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  The Common Hereditary Gene Panel offered by [facility] includes sequencing and/or deletion duplication testing of the following 48 genes: APC, ATM, AXIN2, BARD1, BMPR1A, BRCA1, BRCA2, BRIP1, CDH1, CDK4, CDKN2A (p14ARF), CDKN2A (p16INK4a), CHEK2, CTNNA1, DICER1, EPCAM (Deletion/duplication testing only), GREM1 (promoter region deletion/duplication testing only), KIT, MEN1, MLH1, MSH2, MSH3, MSH6, MUTYH, NBN, NF1, NHTL1, PALB2, PDGFRA, PMS2, POLD1, POLE, PTEN, RAD50, RAD51C, RAD51D, RNF43, SDHB, SDHC, SDHD, SMAD4, SMARCA4. STK11, TP53, TSC1, TSC2, and VHL.  The following genes were evaluated for sequence changes only: SDHA and HOXB13 c.251G>A variant only.   Two VUS's also found - one in ATM c.133C>T and the other in POLE c.1280C>T.  These will not affect medical management. The report date is November 12, 2018.   02/10/2019 - 10/20/2019 Chemotherapy   Patient is on Treatment Plan : BREAST Trastuzumab q21d     02/22/2019 Surgery   Bilateral mastectomies ([name]& [name]) Left breast: benign tissue and no evidence of malignancy Right breast: no residual carcinoma, 2 right axillary lymph nodes negative      CHIEF COMPLIANT: Follow-up of right breast cancer  INTERVAL HISTORY: Teresa BUFFALOis a 40y.o. with above-mentioned history of right breast cancer  having completedneoadjuvant chemotherapy, underwent bilateral mastectomies, and Herceptin maintenance. She presents to the clinic today for follow-up.  She had profound urinary tract infection that  lasted for a whole month in October.  She recovered from all of that.  [facility] sees [facility] for hematuria.  She was on 2 antibiotics and finally it resolved.  She is seeing Dr. Iran Planas with plastic surgery.  She tells me that the implants are flipped.  ALLERGIES:  is allergic to methocarbamol.  MEDICATIONS:  Current Outpatient Medications  Medication Sig Dispense Refill   buPROPion (WELLBUTRIN XL) 150 MG 24 hr tablet Take 1 tablet (150 mg total) by mouth daily. 90 tablet 3   nitrofurantoin, macrocrystal-monohydrate, (MACROBID) 100 MG capsule Take 1 capsule (100 mg total) by mouth 2 (two) times daily. 10 capsule 0   No current facility-administered medications for this visit.    PHYSICAL EXAMINATION: ECOG PERFORMANCE STATUS: 1 - Symptomatic but completely ambulatory  Vitals:   04/15/21 0836  BP: 139/81  Pulse: 79  Resp: 18  Temp: 97.9 F (36.6 C)  SpO2: 100%   Filed Weights   04/15/21 0836  Weight: 177 lb 9.6 oz (80.6 kg)    BREAST: Prior bilateral mastectomies with reconstruction without any palpable lumps or nodules (exam performed in the presence of a chaperone)  LABORATORY DATA:  I have reviewed the data as listed CMP Latest Ref Rng & Units 02/20/2021 12/30/2019 07/07/2019  Glucose 70 - 99 mg/dL 91 83 97  BUN 6 - 20 mg/dL '14 14 18  ' Creatinine 0.44 - 1.00 mg/dL 0.83 0.81 0.88  Sodium 135 - 145 mmol/L 140 139 143  Potassium 3.5 - 5.1 mmol/L 4.2 3.9 4.6  Chloride 98 - 111 mmol/L 102 101 108  CO2 22 - 32 mmol/L '25 29 26  ' Calcium 8.9 - 10.3 mg/dL 9.7 9.1 9.2  Total Protein 6.5 - 8.1 g/dL 7.6 7.4 7.0  Total Bilirubin 0.3 - 1.2 mg/dL 0.3 0.5 0.4  Alkaline Phos 38 - 126 U/L 63 62 70  AST 15 - 41 U/L 14(L) 15 11(L)  ALT 0 - 44 U/L '12 13 10    ' Lab Results  Component Value Date   WBC 7.6 02/20/2021   HGB 13.9 02/20/2021   HCT 40.5 02/20/2021   MCV 93.3 02/20/2021   PLT 248 02/20/2021   NEUTROABS 5.1 02/20/2021    ASSESSMENT & PLAN:  Malignant neoplasm of  upper-outer quadrant of right breast in female, estrogen receptor negative ([place]) negative ([place]) 10/11/2018:Patient palpated a right breast mass, mammogram revealed 1.6 cm mass, biopsy revealed grade 3 invasive ductal carcinoma ER 0%, PR 0%, Ki-67 20%, HER-2 3+ positive, T1CN0 stage IA   Treatment plan: 1. Neoadjuvant Taxol and Herceptin weekly x12.  Followed by Herceptin maintenance therapy every 3 weeks to complete 1 year.  10/29/2018-01/26/2019, Herceptin maintenance subcutaneously until 10/20/2019 2.  02/22/2019:Bilateral mastectomies [name] & [name]) Left breast: benign tissue and no evidence of malignancy Right breast: no residual carcinoma, 2 right axillary lymph nodes negative    Upbeat clinical trial: No adverse effects from participating in the trial Lynch syndrome positive on genetic testing (I discussed with her about aspirin 600 mg for prevention of colon cancer risk: Study published in [place]) ---------------------------------------------------------------------------------------------------------------------------------------- Depression/anxiety/panic attacks: On Wellbutrin Chemo-induced peripheral neuropathy: Mild symptoms on her shins.  No further problems She and her husband got back together and she is very happy.    01/04/2020: Dr. Denman George hysterectomy.with BSO (profound nausea and vomiting post surgery)  Breast Cancer surveillance:  1. Mammograms: not indicated since she had bil mastectomies 2. Breast Exam: Benign Return to clinic in 1 year for follow-up.    No orders of the defined types were placed in this encounter.  The patient has a good understanding of the overall plan. she agrees with it. she will call with any problems that may develop before the next visit here.  Total time spent: 20 mins including face to face time and time spent for planning, charting and coordination of care  Rulon Eisenmenger, MD, MPH 04/15/2021  I, Thana Ates, am acting as scribe for  Dr. [name].  I have reviewed the above documentation for accuracy and completeness, and I agree with the above.

## 2021-04-15 ENCOUNTER — Other Ambulatory Visit: Payer: Self-pay

## 2021-04-15 ENCOUNTER — Inpatient Hospital Stay: Payer: BC Managed Care – PPO | Attending: Hematology and Oncology | Admitting: Hematology and Oncology

## 2021-04-15 DIAGNOSIS — Z79899 Other long term (current) drug therapy: Secondary | ICD-10-CM | POA: Diagnosis not present

## 2021-04-15 DIAGNOSIS — C50411 Malignant neoplasm of upper-outer quadrant of right female breast: Secondary | ICD-10-CM | POA: Insufficient documentation

## 2021-04-15 DIAGNOSIS — Z171 Estrogen receptor negative status [ER-]: Secondary | ICD-10-CM | POA: Insufficient documentation

## 2021-04-15 DIAGNOSIS — Z9013 Acquired absence of bilateral breasts and nipples: Secondary | ICD-10-CM | POA: Insufficient documentation

## 2021-04-17 ENCOUNTER — Telehealth: Payer: Self-pay | Admitting: Hematology and Oncology

## 2021-04-17 NOTE — Telephone Encounter (Signed)
Patient will be mailed updated calendar.  °

## 2021-04-23 ENCOUNTER — Encounter: Payer: Self-pay | Admitting: Hematology and Oncology

## 2021-05-15 ENCOUNTER — Encounter: Payer: Self-pay | Admitting: Gastroenterology

## 2021-05-22 IMAGING — MR MR BILATERAL BREAST WITHOUT AND WITH CONTRAST
8 of 13 series · 29 of 48 positions shown · IV contrast (8 ml gadavist)
Comparison: Previous exam(s).

CLINICAL DATA: 37-year-old female with recently diagnosed grade 3
invasive ductal carcinoma of the right breast post ultrasound-guided
biopsy of a 1.6 cm mass the 10 o'clock position.

LABS:  Not applicable.
EXAM:
BILATERAL BREAST MRI WITH AND WITHOUT CONTRAST
TECHNIQUE: Multiplanar, multisequence MR images of both breasts were obtained
prior to and following the intravenous administration of 8 ml of
Gadavist

[Series 2: t2_tirm_tra ipat (a-p) · axial · 3.0mm · 0.70mm/px · 1 of 60 slices shown]
[im 1/60]
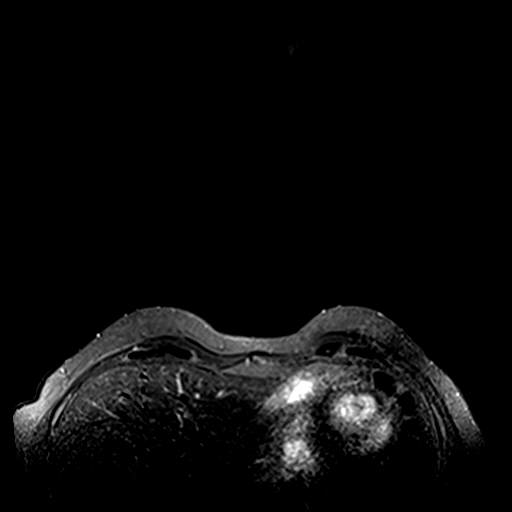

[Series 3: fl3d pre-cm no · axial · non-contrast · 0.9mm · 0.94mm/px · z∈[-53,+105]mm · 5 of 176 slices shown]
[im 1/176]
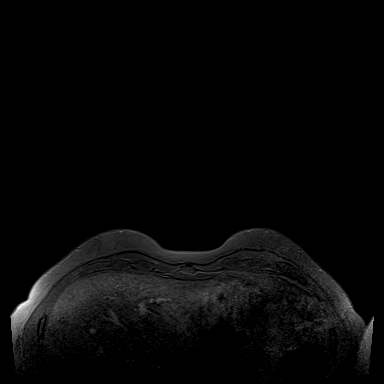
[im 44/176]
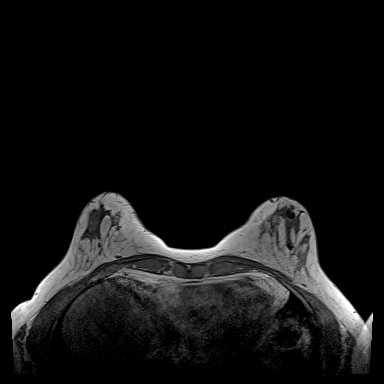
[im 88/176]
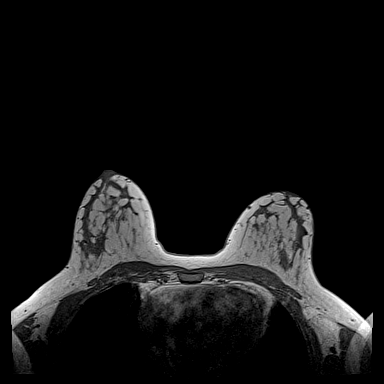
[im 132/176]
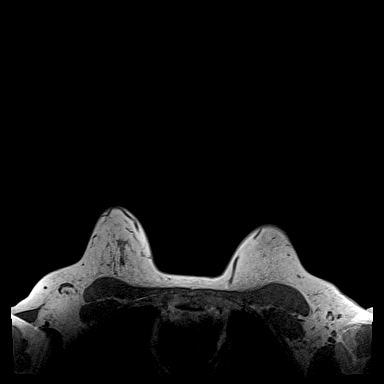
[im 176/176]
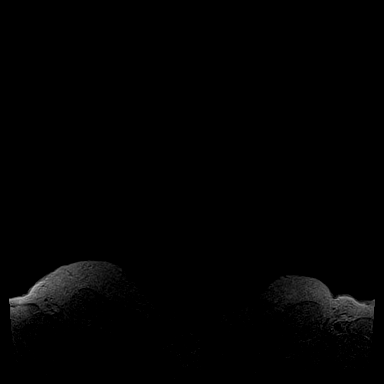

[Series 4: fl3d pre-cm · axial · non-contrast · 0.9mm · 0.87mm/px · z∈[-53,+105]mm · 5 of 176 slices shown]
[im 1/176]
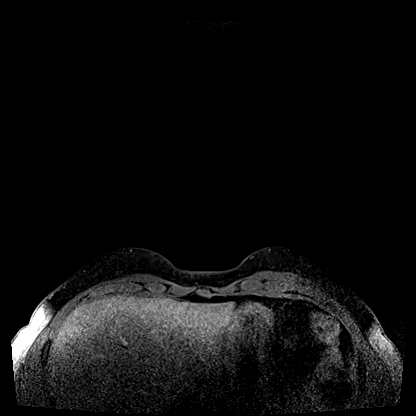
[im 44/176]
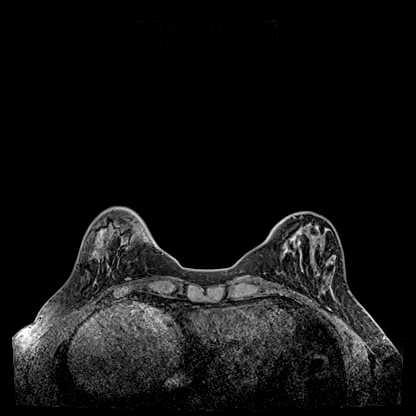
[im 88/176]
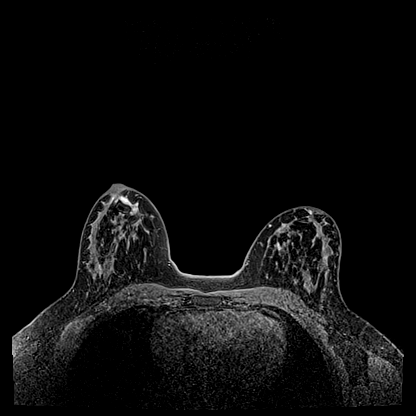
[im 132/176]
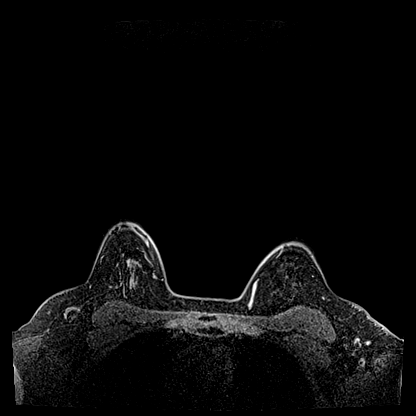
[im 176/176]
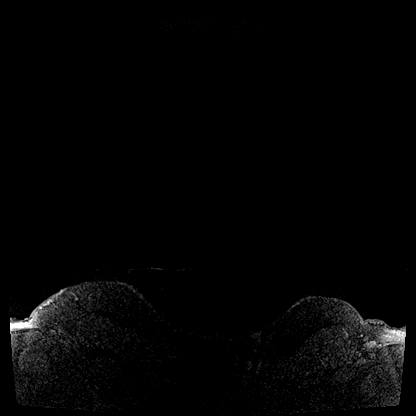

[Series 5: fl3d post-cm 20 · axial · 0.9mm · 0.87mm/px · z∈[-53,+105]mm · 5 of 176 slices shown (1 of 3)]
[im 1/176]
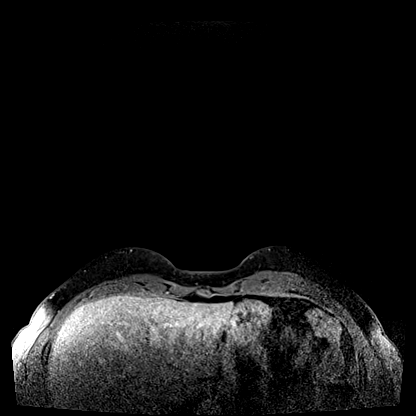
[im 44/176]
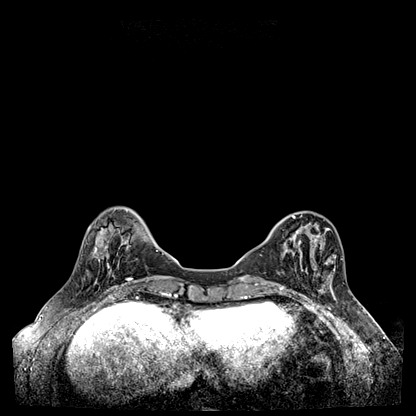
[im 88/176]
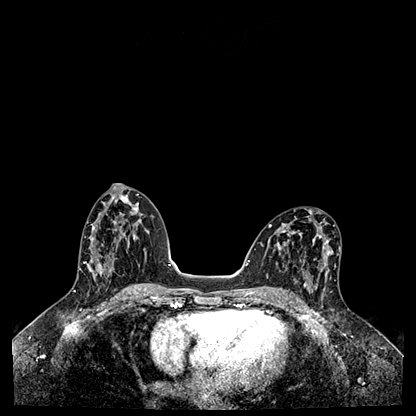
[im 132/176]
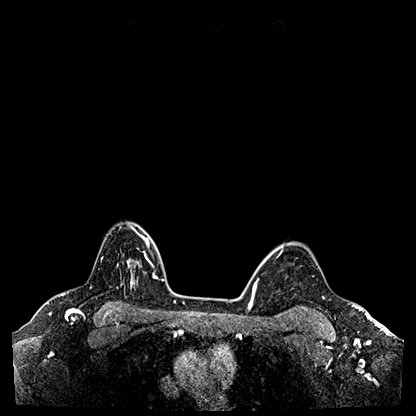
[im 176/176]
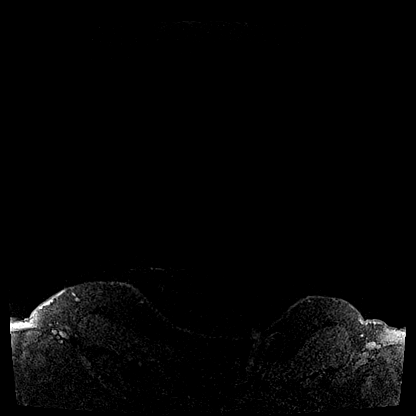

[Series 6: fl3d post-cm 20 · axial · 0.9mm · 0.87mm/px · z∈[-53,+105]mm · 5 of 176 slices shown (2 of 3)]
[im 1/176]
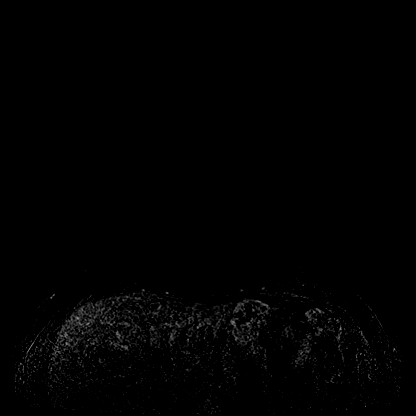
[im 44/176]
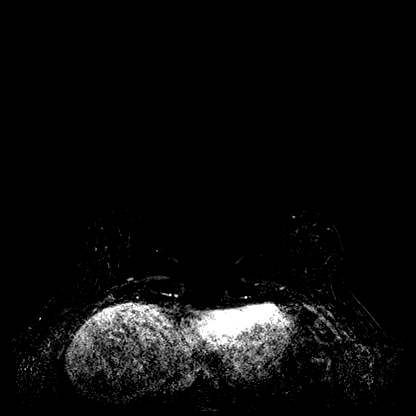
[im 88/176]
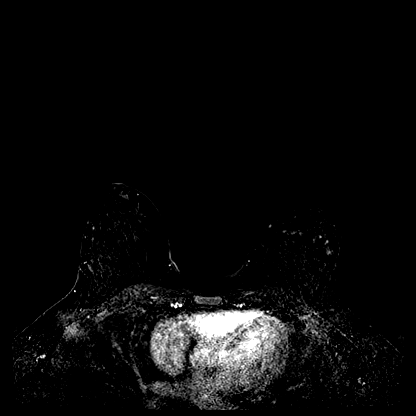
[im 132/176]
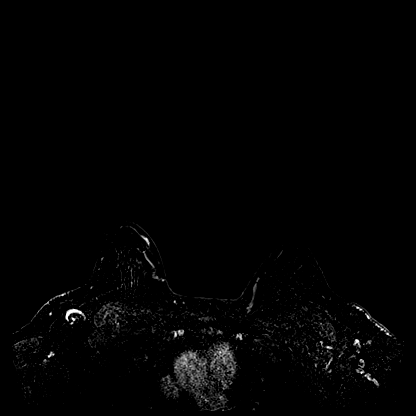
[im 176/176]
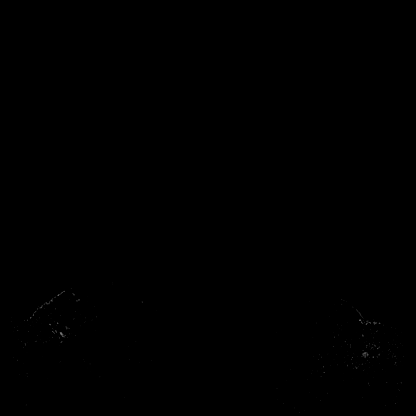

[Series 7: fl3d post-cm 20 · axial · 158.4mm · 0.87mm/px · 1 of 1 slices shown (3 of 3)]
[im 1/1]
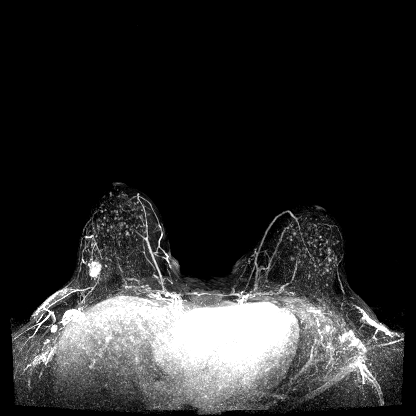

[Series 8: fl3d post-cm 3min · axial · 0.9mm · 0.87mm/px · z∈[-53,+105]mm · 5 of 176 slices shown]
[im 1/176]
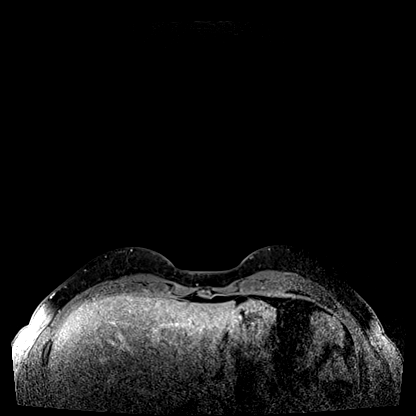
[im 44/176]
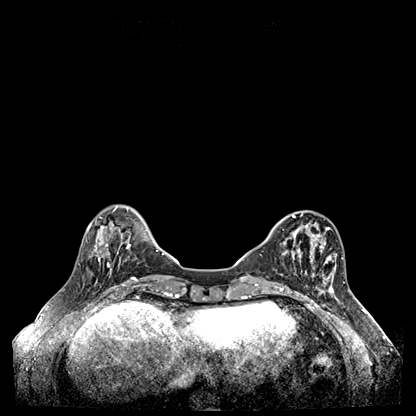
[im 88/176]
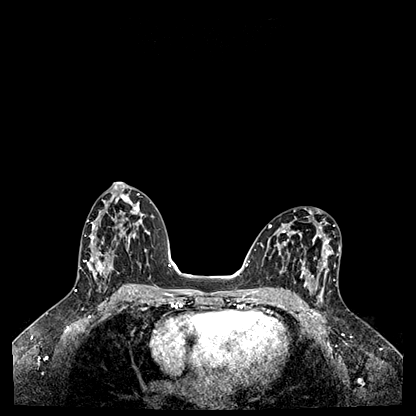
[im 132/176]
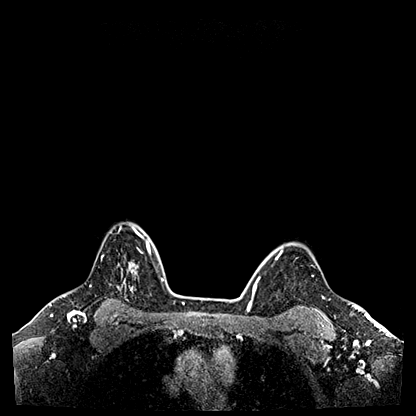
[im 176/176]
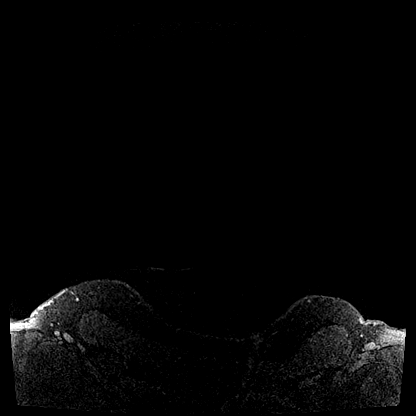

[Series 9: fl3d post-cm 3min_sub · axial · 0.9mm · 0.87mm/px · z∈[-53,-21]mm · 2 of 176 slices shown]
[im 1/176]
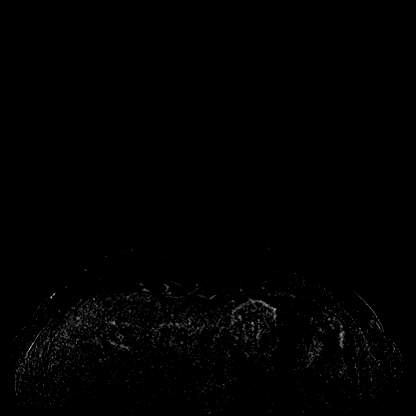
[im 36/176]
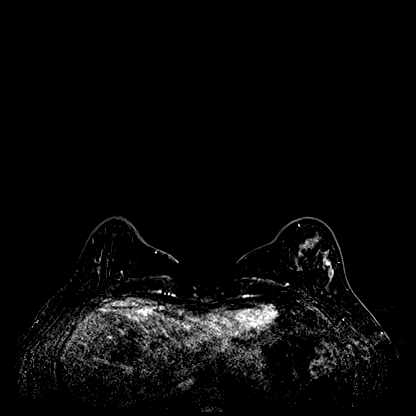

[29 of 48 positions shown; findings below may reference images not displayed]

Three-dimensional MR images were rendered by post-processing of the
original MR data on an independent workstation. The
three-dimensional MR images were interpreted, and findings are
reported in the following complete MRI report for this study. Three
dimensional images were evaluated at the independent [employer]
workstation
FINDINGS: Breast composition: c.  Heterogeneous fibroglandular tissue.

Background parenchymal enhancement: There is moderate enhancement of
the background breast parenchyma. Multiple bilateral cysts are
present.

Right breast: Biopsy proven malignancy in the upper-outer quadrant
of the right breast at the 10 o'clock position measures 1.5 cm AP,
0.9 cm transverse, and 1.6 cm craniocaudal. No additional enhancing
masses or abnormal areas of enhancement in the right breast to
suggest other sites of disease.

Left breast: No suspicious enhancing masses or abnormal areas of
enhancement seen in the left breast.

Lymph nodes: No morphologically abnormal axillary lymph nodes. No
internal mammary lymphadenopathy.

Ancillary findings:  None.
IMPRESSION: 1. Biopsy proven malignancy in the right breast at the 10 o'clock
position measures 1.5 x 0.9 x 1.6 cm. No additional abnormal areas
of enhancement in the right breast and no axillary lymphadenopathy.

2. No MRI evidence of malignancy in the left breast.

RECOMMENDATION:
Treatment plan for known right breast malignancy.

BI-RADS CATEGORY  6: Known biopsy-proven malignancy.

## 2021-05-31 ENCOUNTER — Other Ambulatory Visit: Payer: Self-pay

## 2021-05-31 ENCOUNTER — Ambulatory Visit (AMBULATORY_SURGERY_CENTER): Payer: Self-pay | Admitting: *Deleted

## 2021-05-31 VITALS — Ht 63.0 in | Wt 155.0 lb

## 2021-05-31 DIAGNOSIS — Z1509 Genetic susceptibility to other malignant neoplasm: Secondary | ICD-10-CM

## 2021-05-31 MED ORDER — NA SULFATE-K SULFATE-MG SULF 17.5-3.13-1.6 GM/177ML PO SOLN: 1.0000 | Freq: Once | ORAL | 0 refills | Status: AC

## 2021-05-31 NOTE — Progress Notes (Signed)
No egg or soy allergy known to patient  °No issues known to pt with past sedation with any surgeries or procedures °Patient denies ever being told they had issues or difficulty with intubation  °No FH of Malignant Hyperthermia °Pt is not on diet pills °Pt is not on  home 02  °Pt is not on blood thinners  °Pt denies issues with constipation  °No A fib or A flutter ° °Pt is not vaccinated  for Covid  ° °Due to the COVID-19 pandemic we are asking patients to follow certain guidelines in PV and the [facility]   °Pt aware of COVID protocols and [facility] guidelines  ° °PV completed over the phone. Pt verified name, DOB, address and insurance during PV today.  °Pt mailed instruction packet with copy of consent form to read and not return, and instructions.  ° °Pt encouraged to call with questions or issues.  °If pt has My chart, procedure instructions sent via My Chart  ° °

## 2021-06-05 ENCOUNTER — Encounter: Payer: Self-pay | Admitting: Hematology and Oncology

## 2021-06-12 ENCOUNTER — Encounter: Payer: Self-pay | Admitting: Gastroenterology

## 2021-06-14 ENCOUNTER — Ambulatory Visit (AMBULATORY_SURGERY_CENTER): Payer: BC Managed Care – PPO | Admitting: Gastroenterology

## 2021-06-14 ENCOUNTER — Encounter: Payer: Self-pay | Admitting: Gastroenterology

## 2021-06-14 VITALS — BP 125/76 | HR 72 | Temp 98.2°F | Resp 17 | Ht 63.0 in | Wt 155.0 lb

## 2021-06-14 DIAGNOSIS — Z1509 Genetic susceptibility to other malignant neoplasm: Secondary | ICD-10-CM | POA: Diagnosis not present

## 2021-06-14 DIAGNOSIS — Z1211 Encounter for screening for malignant neoplasm of colon: Secondary | ICD-10-CM | POA: Diagnosis not present

## 2021-06-14 MED ORDER — SODIUM CHLORIDE 0.9 % IV SOLN: 500.0000 mL | Freq: Once | INTRAVENOUS | Status: DC

## 2021-06-14 NOTE — Op Note (Signed)
[place] Patient Name: [name] Procedure Date: 06/14/2021 8:48 AM MRN: [id number] Endoscopist: Milus Banister , MD Age: 41 Referring MD:  Date of Birth: [birth year] Gender: Female Account #: [id number] Procedure:                Colonoscopy Indications:              lynch syndrome PMS2 mutation; Colonsocoy 02/2019                            was normal. Colonsocopy 06/2020 was normal. Medicines:                Monitored Anesthesia Care Procedure:                Pre-Anesthesia Assessment:                           - Prior to the procedure, a History and Physical                            was performed, and patient medications and                            allergies were reviewed. The patient's tolerance of                            previous anesthesia was also reviewed. The risks                            and benefits of the procedure and the sedation                            options and risks were discussed with the patient.                            All questions were answered, and informed consent                            was obtained. Prior Anticoagulants: The patient has                            taken no previous anticoagulant or antiplatelet                            agents. ASA Grade Assessment: II - A patient with                            mild systemic disease. After reviewing the risks                            and benefits, the patient was deemed in                            satisfactory condition to undergo the procedure.  After obtaining informed consent, the colonoscope                            was passed under direct vision. Throughout the                            procedure, the patient's blood pressure, pulse, and                            oxygen saturations were monitored continuously. The                            [id number]-HQ190L Colonoscope was introduced through the                            anus and advanced  to the the cecum, identified by                            appendiceal orifice and ileocecal valve. The                            colonoscopy was performed without difficulty. The                            patient tolerated the procedure well. The quality                            of the bowel preparation was good. The ileocecal                            valve, appendiceal orifice, and rectum were                            photographed. Scope In: 8:58:58 AM Scope Out: 9:11:47 AM Scope Withdrawal Time: 0 hours 9 minutes 45 seconds  Total Procedure Duration: 0 hours 12 minutes 49 seconds  Findings:                 The entire examined colon appeared normal on direct                            and retroflexion views. Complications:            No immediate complications. Estimated blood loss:                            None. Estimated Blood Loss:     Estimated blood loss: none. Impression:               - The entire examined colon is normal on direct and                            retroflexion views.                           - No polyps or cancers. Recommendation:           -  Patient has a contact number available for                            emergencies. The signs and symptoms of potential                            delayed complications were discussed with the                            patient. Return to normal activities tomorrow.                            Written discharge instructions were provided to the                            patient.                           - Resume previous diet.                           - Continue present medications.                           - Repeat colonoscopy in 1 year for screening. Same                            time EGD for Lynch Syndrome. Milus Banister, MD 06/14/2021 9:18:14 AM This report has been signed electronically.

## 2021-06-14 NOTE — Progress Notes (Signed)
Pt. Reports no change in her medical or surgical history since her pre-visit 05/31/2021.

## 2021-06-14 NOTE — Patient Instructions (Signed)
Resume previous diet and medications  Repeat colonoscopy in 1 year for screening and schedule EGD at the same time fro Lynch Syndrome.   YOU HAD AN ENDOSCOPIC PROCEDURE TODAY AT [facility]:   Refer to the procedure report that was given to you for any specific questions about what was found during the examination.  If the procedure report does not answer your questions, please call your gastroenterologist to clarify.  If you requested that your care partner not be given the details of your procedure findings, then the procedure report has been included in a sealed envelope for you to review at your convenience later.  YOU SHOULD EXPECT: Some feelings of bloating in the abdomen. Passage of more gas than usual.  Walking can help get rid of the air that was put into your GI tract during the procedure and reduce the bloating. If you had a lower endoscopy (such as a colonoscopy or flexible sigmoidoscopy) you may notice spotting of blood in your stool or on the toilet paper. If you underwent a bowel prep for your procedure, you may not have a normal bowel movement for a few days.  Please Note:  You might notice some irritation and congestion in your nose or some drainage.  This is from the oxygen used during your procedure.  There is no need for concern and it should clear up in a day or so.  SYMPTOMS TO REPORT IMMEDIATELY:  Following lower endoscopy (colonoscopy or flexible sigmoidoscopy):  Excessive amounts of blood in the stool  Significant tenderness or worsening of abdominal pains  Swelling of the abdomen that is new, acute  Fever of 100F or higher  For urgent or emergent issues, a gastroenterologist can be reached at any hour by calling [phone]. Do not use MyChart messaging for urgent concerns.    DIET:  We do recommend a small meal at first, but then you may proceed to your regular diet.  Drink plenty of fluids but you should avoid alcoholic beverages for 24  hours.  ACTIVITY:  You should plan to take it easy for the rest of today and you should NOT DRIVE or use heavy machinery until tomorrow (because of the sedation medicines used during the test).    FOLLOW UP: Our staff will call the number listed on your records 48-72 hours following your procedure to check on you and address any questions or concerns that you may have regarding the information given to you following your procedure. If we do not reach you, we will leave a message.  We will attempt to reach you two times.  During this call, we will ask if you have developed any symptoms of COVID 19. If you develop any symptoms (ie: fever, flu-like symptoms, shortness of breath, cough etc.) before then, please call [phone].  If you test positive for Covid 19 in the 2 weeks post procedure, please call and report this information to [place].    If any biopsies were taken you will be contacted by phone or by letter within the next 1-3 weeks.  Please call us at [phone] if you have not heard about the biopsies in 3 weeks.    SIGNATURES/CONFIDENTIALITY: You and/or your care partner have signed paperwork which will be entered into your electronic medical record.  These signatures attest to the fact that that the information above on your After Visit Summary has been reviewed and is understood.  Full responsibility of the confidentiality of this discharge information lies  with you and/or your care-partner.

## 2021-06-14 NOTE — Progress Notes (Signed)
HPI: This is a woman with lynch syndrome   ROS: complete GI ROS as described in HPI, all other review negative.  Constitutional:  No unintentional weight loss   Past Medical History:  Diagnosis Date   Anxiety    over cancer diagnosis   Breast cancer ([place])    right breast cancer chemo finished per pt   Complication of anesthesia    Depression    Family history of breast cancer    Lynch syndrome    PONV (postoperative nausea and vomiting)    Scleroderma ([place])     Past Surgical History:  Procedure Laterality Date   BREAST RECONSTRUCTION WITH PLACEMENT OF TISSUE EXPANDER AND FLEX HD (ACELLULAR HYDRATED DERMIS) Bilateral 02/22/2019   Procedure: BILATERAL BREAST RECONSTRUCTION WITH PLACEMENT OF TISSUE EXPANDER AND FLEX HD (ACELLULAR HYDRATED DERMIS);  Surgeon: Irene Limbo, MD;  Location: [facility];  Service: Plastics;  Laterality: Bilateral;   CERVICAL FUSION  2019   CESAREAN SECTION  2005   CESAREAN SECTION  02/27/2011   Procedure: CESAREAN SECTION;  Surgeon: Shon Millet II;  Location: [facility];  Service: Gynecology;  Laterality: N/A;  repeat   COLONOSCOPY     LIPOSUCTION WITH LIPOFILLING Bilateral 05/31/2019   Procedure: LIPOFILLING FROM ABDOMEN TO BILATERAL CHEST;  Surgeon: Irene Limbo, MD;  Location: [facility];  Service: Plastics;  Laterality: Bilateral;   NIPPLE SPARING MASTECTOMY WITH SENTINEL LYMPH NODE BIOPSY Bilateral 02/22/2019   Procedure: RIGHT NIPPLE SPARING MASTECTOMY AND LEFT RISK REDUCING NIPPLE SPARING MASTECTOMY WITH RIGHT AXILLARY SENTINEL LYMPH NODE BIOPSY;  Surgeon: Rolm Bookbinder, MD;  Location: [facility];  Service: General;  Laterality: Bilateral;  BILATERAL PECTORAL BLOCK   PORT-A-CATH REMOVAL Right 05/31/2019   Procedure: REMOVAL PORT-A-CATH;  Surgeon: Irene Limbo, MD;  Location: [facility];  Service: Plastics;  Laterality: Right;   PORTACATH PLACEMENT Right 10/20/2018    Procedure: INSERTION PORT-A-CATH WITH ULTRASOUND;  Surgeon: Rolm Bookbinder, MD;  Location: [facility];  Service: General;  Laterality: Right;   REMOVAL OF BILATERAL TISSUE EXPANDERS WITH PLACEMENT OF BILATERAL BREAST IMPLANTS Bilateral 05/31/2019   Procedure: REMOVAL OF BILATERAL TISSUE EXPANDERS WITH PLACEMENT OF SILICONE BILATERAL BREAST IMPLANTS;  Surgeon: Irene Limbo, MD;  Location: [facility];  Service: Plastics;  Laterality: Bilateral;   ROBOTIC ASSISTED LAPAROSCOPIC HYSTERECTOMY AND SALPINGECTOMY N/A 01/04/2020   Procedure: XI ROBOTIC ASSISTED LAPAROSCOPIC HYSTERECTOMY AND BILATERAL SALPINGECTOMY;  Surgeon: [name], MD;  Location[facility];  Service: Gynecology;  Laterality: N/A;    Current Outpatient Medications  Medication Sig Dispense Refill   buPROPion (WELLBUTRIN XL) 150 MG 24 hr tablet Take 1 tablet (150 mg total) by mouth daily. 90 tablet 3   Current Facility-Administered Medications  Medication Dose Route Frequency Provider Last Rate Last Admin   0.9 %  sodium chloride infusion  500 mL Intravenous Once Milus Banister, MD        Allergies as of 06/14/2021 - Review Complete 06/14/2021  Allergen Reaction Noted   Methocarbamol Itching 03/17/2019    Family History  Problem Relation Age of Onset   Rectal cancer Mother    Ulcerative colitis Mother    Diabetes Mother    Colon polyps Father    Diabetes Father    Rheum arthritis Maternal Grandmother    Heart disease Maternal Grandfather    Hashimoto's thyroiditis Son    Colon cancer Cousin        @nd  cousin  Ulcerative colitis Niece    Breast cancer Other    Breast cancer Other    Breast cancer Other        paternal great grandmother   Esophageal cancer Neg Hx    Stomach cancer Neg Hx     Social History   Socioeconomic History   Marital status: Married    Spouse name: Not on file   Number of children: 2   Years of education: Not on file    Highest education level: Not on file  Occupational History   Occupation: [employer]   Occupation: treatment coordinator  Tobacco Use   Smoking status: Never   Smokeless tobacco: Never  Vaping Use   Vaping Use: Never used  Substance and Sexual Activity   Alcohol use: No   Drug use: No   Sexual activity: Yes    Birth control/protection: Surgical    Comment: husband has had vasectomy  Other Topics Concern   Not on file  Social History Narrative   Not on file   Social Determinants of Health   Financial Resource Strain: Not on file  Food Insecurity: Not on file  Transportation Needs: Not on file  Physical Activity: Not on file  Stress: Not on file  Social Connections: Not on file  Intimate Partner Violence: Not on file     Physical Exam: BP 132/82    Pulse 78    Temp 98.2 F (36.8 C)    Ht 5\' 3"  (1.6 m)    Wt 155 lb (70.3 kg)    LMP 12/20/2019    SpO2 100%    BMI 27.46 kg/m  Constitutional: generally well-appearing Psychiatric: alert and oriented x3 Lungs: CTA bilaterally Heart: no MCR  Assessment and plan: 41 y.o. female with lynch syndrome  Colonoscoyp today  Care is appropriate for the ambulatory setting.  Owens Loffler, MD [facility] 06/14/2021, 8:48 AM

## 2021-06-14 NOTE — Progress Notes (Signed)
A and O x3. Report to RN. Tolerated MAC anesthesia well.

## 2021-06-18 ENCOUNTER — Telehealth: Payer: Self-pay | Admitting: *Deleted

## 2021-06-18 NOTE — Telephone Encounter (Signed)
°  Follow up Call-  Call back number 06/14/2021 06/12/2020 02/04/2019  Post procedure Call Back phone  # [phone]  Permission to leave phone message Yes Yes Yes  Some recent data might be hidden     Patient questions:  Do you have a fever, pain , or abdominal swelling? No. Pain Score  0 *  Have you tolerated food without any problems? Yes.    Have you been able to return to your normal activities? Yes.    Do you have any questions about your discharge instructions: Diet   No. Medications  No. Follow up visit  No.  Do you have questions or concerns about your Care? No.  Actions: * If pain score is 4 or above: No action needed, pain <4.

## 2021-09-03 ENCOUNTER — Telehealth: Payer: Self-pay | Admitting: Radiology

## 2021-09-03 NOTE — Telephone Encounter (Signed)
WF [id number] Understanding and Predicting Breast Cancer Events after Treatment (UPBEAT)  ? ?09/03/2021 ? ? ?PHONE CALL: Attempted to reach patient concerning 3 year phone call for the above mentioned study. Patient's voicemail is full. Will attempt to call again tomorrow.  ? ?[name], RT(R)(T) ?Clinical Research Coordinator ? ?

## 2021-09-06 DIAGNOSIS — Z171 Estrogen receptor negative status [ER-]: Secondary | ICD-10-CM | POA: Diagnosis not present

## 2021-09-06 DIAGNOSIS — C50411 Malignant neoplasm of upper-outer quadrant of right female breast: Secondary | ICD-10-CM | POA: Diagnosis not present

## 2021-09-09 ENCOUNTER — Telehealth: Payer: Self-pay | Admitting: Radiology

## 2021-09-09 NOTE — Telephone Encounter (Signed)
WF [id number] Understanding and Predicting Breast Cancer Events after Treatment (UPBEAT)  ? ?09/09/2021 ? ?PHONE CALL 3 YR F/U: Confirmed I was speaking with [name] . Informed patient reason for call is for 3 yr f/u for the above mentioned study. Patient confirmed no cardiac events or hospitalizations. Patient informed this coordinator she does have a new e-mail address. This coordinator changed e-mail address in [place] and resent a link to follow up surveys to new e-mail. Thanked patient for her time and continued participation in the above mentioned study.  ? ?[name], RT(R)(T) ?Clinical Research Coordinator ? ?

## 2021-10-15 ENCOUNTER — Telehealth: Payer: Self-pay | Admitting: Radiology

## 2021-10-15 NOTE — Telephone Encounter (Signed)
WF [id number] Understanding and Predicting Breast Cancer Events after Treatment (UPBEAT)   10/15/2021  PHONE CALL: Confirmed I was speaking with Drue Dun . Informed patient reason for call is to complete the 3 yr follow-up phone call for the above mentioned study. Patient completed all questions over the phone and has no problems to limit day to day activities. Thanked patient for her time and continued support in the above mentioned study.   [name], RT(R)(T) Clinical Research Coordinator

## 2022-01-23 ENCOUNTER — Encounter: Payer: Self-pay | Admitting: Gastroenterology

## 2022-03-10 ENCOUNTER — Other Ambulatory Visit: Payer: Self-pay | Admitting: Hematology and Oncology

## 2022-04-16 ENCOUNTER — Ambulatory Visit: Payer: BC Managed Care – PPO | Admitting: Hematology and Oncology

## 2022-04-30 ENCOUNTER — Telehealth: Payer: Self-pay | Admitting: Hematology and Oncology

## 2022-04-30 NOTE — Telephone Encounter (Signed)
Rescheduled appointment per provider on-call. Patient is aware of the changes made to her upcoming appointment. 

## 2022-05-13 ENCOUNTER — Ambulatory Visit: Payer: BC Managed Care – PPO | Admitting: Hematology and Oncology

## 2022-05-16 ENCOUNTER — Encounter: Payer: Self-pay | Admitting: Gastroenterology

## 2022-05-22 ENCOUNTER — Encounter: Payer: Self-pay | Admitting: Hematology and Oncology

## 2022-05-29 ENCOUNTER — Inpatient Hospital Stay: Payer: BC Managed Care – PPO | Attending: Hematology and Oncology | Admitting: Hematology and Oncology

## 2022-05-29 ENCOUNTER — Other Ambulatory Visit: Payer: Self-pay

## 2022-05-29 ENCOUNTER — Encounter: Payer: Self-pay | Admitting: Hematology and Oncology

## 2022-05-29 VITALS — BP 129/91 | HR 82 | Temp 97.7°F | Resp 16 | Wt 171.1 lb

## 2022-05-29 DIAGNOSIS — C50411 Malignant neoplasm of upper-outer quadrant of right female breast: Secondary | ICD-10-CM

## 2022-05-29 DIAGNOSIS — Z853 Personal history of malignant neoplasm of breast: Secondary | ICD-10-CM | POA: Diagnosis present

## 2022-05-29 DIAGNOSIS — Z171 Estrogen receptor negative status [ER-]: Secondary | ICD-10-CM

## 2022-05-29 DIAGNOSIS — Z9013 Acquired absence of bilateral breasts and nipples: Secondary | ICD-10-CM | POA: Insufficient documentation

## 2022-05-29 DIAGNOSIS — Z9221 Personal history of antineoplastic chemotherapy: Secondary | ICD-10-CM | POA: Diagnosis not present

## 2022-05-29 MED ORDER — BUPROPION HCL ER (XL) 150 MG PO TB24: ORAL_TABLET | ORAL | 3 refills | Status: DC

## 2022-05-29 NOTE — Progress Notes (Signed)
Patient Care Team: [name], [place] as PCP - General (Nurse Practitioner) [name], RN (Inactive) as Registered Nurse [name], MD as Consulting Physician (Gynecologic Oncology)  DIAGNOSIS:  Encounter Diagnosis  Name Primary?   Malignant neoplasm of upper-outer quadrant of right breast in female, estrogen receptor negative ([place]) Yes    SUMMARY OF ONCOLOGIC HISTORY: Oncology History  Malignant neoplasm of upper-outer quadrant of right breast in female, estrogen receptor negative ([place])  10/11/2018 Initial Diagnosis   Patient palpated a right breast mass, mammogram revealed 1.6 cm mass, biopsy revealed grade 3 invasive ductal carcinoma ER 0%, PR 0%, Ki-67 20%, HER-2 3+ positive, T1CN0 stage IA   10/19/2018 Cancer Staging   Staging form: Breast, AJCC 8th Edition - Clinical stage from 10/19/2018: Stage IA (cT1c, cN0, cM0, G3, ER-, PR-, HER2+) - Signed by [name], MD on 10/19/2018   10/29/2018 - 01/20/2019 Chemotherapy   The patient had trastuzumab (HERCEPTIN) 300 mg in sodium chloride 0.9 % 250 mL chemo infusion, 294 mg, Intravenous,  Once, 2 of 2 cycles Administration: 300 mg (10/29/2018), 150 mg (11/03/2018), 150 mg (12/02/2018), 150 mg (11/12/2018), 150 mg (11/25/2018), 150 mg (12/09/2018) PACLitaxel (TAXOL) 150 mg in sodium chloride 0.9 % 250 mL chemo infusion (</= '80mg'$ /m2), 80 mg/m2 = 150 mg, Intravenous,  Once, 3 of 3 cycles Dose modification: 65 mg/m2 (original dose 80 mg/m2, Cycle 3, Reason: Dose not tolerated) Administration: 150 mg (10/29/2018), 150 mg (11/03/2018), 150 mg (12/02/2018), 150 mg (11/12/2018), 150 mg (11/25/2018), 150 mg (12/09/2018), 150 mg (12/16/2018), 150 mg (12/23/2018), 120 mg (12/30/2018), 120 mg (01/06/2019), 120 mg (01/13/2019), 120 mg (01/20/2019) trastuzumab-dkst (OGIVRI) 150 mg in sodium chloride 0.9 % 250 mL chemo infusion, 150 mg (100 % of original dose 150 mg), Intravenous,  Once, 2 of 2 cycles Dose modification: 150 mg (original dose 150 mg, Cycle 2,  Reason: Other (see comments), Comment: Biosimilar Conversion), 450 mg (original dose 150 mg, Cycle 3, Reason: Other (see comments), Comment: Biosimilar Conversion) Administration: 150 mg (12/16/2018), 150 mg (12/23/2018), 150 mg (12/30/2018), 150 mg (01/06/2019), 150 mg (01/13/2019), 450 mg (01/20/2019)  for chemotherapy treatment.    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  The Common Hereditary Gene Panel offered by [facility] includes sequencing and/or deletion duplication testing of the following 48 genes: APC, ATM, AXIN2, BARD1, BMPR1A, BRCA1, BRCA2, BRIP1, CDH1, CDK4, CDKN2A (p14ARF), CDKN2A (p16INK4a), CHEK2, CTNNA1, DICER1, EPCAM (Deletion/duplication testing only), GREM1 (promoter region deletion/duplication testing only), KIT, MEN1, MLH1, MSH2, MSH3, MSH6, MUTYH, NBN, NF1, NHTL1, PALB2, PDGFRA, PMS2, POLD1, POLE, PTEN, RAD50, RAD51C, RAD51D, RNF43, SDHB, SDHC, SDHD, SMAD4, SMARCA4. STK11, TP53, TSC1, TSC2, and VHL.  The following genes were evaluated for sequence changes only: SDHA and HOXB13 c.251G>A variant only.   Two VUS's also found - one in ATM c.133C>T and the other in POLE c.1280C>T.  These will not affect medical management. The report date is November 12, 2018.   02/10/2019 - 10/20/2019 Chemotherapy   Patient is on Treatment Plan : BREAST Trastuzumab q21d     02/22/2019 Surgery   Bilateral mastectomies [name]) Left breast: benign tissue and no evidence of malignancy Right breast: no residual carcinoma, 2 right axillary lymph nodes negative      CHIEF COMPLIANT: Follow-up of right breast cancer   INTERVAL HISTORY: [name] is a 42 y.o. with above-mentioned history of right breast cancer having completedneoadjuvant chemotherapy, underwent bilateral mastectomies, and Herceptin maintenance.  She presents to the clinic today for follow-up. She reports that the numbness in the chest wore off.  She denies no numbness in fingers or toes. She denies any pain or discomfort in breast.   ALLERGIES:  is allergic to methocarbamol.  MEDICATIONS:  Current Outpatient Medications  Medication Sig Dispense Refill   buPROPion (WELLBUTRIN XL) 150 MG 24 hr tablet TAKE 1 TABLET(150 MG) BY MOUTH DAILY 90 tablet 3   No current facility-administered medications for this visit.    PHYSICAL EXAMINATION: ECOG PERFORMANCE STATUS: 0 - Asymptomatic  There were no vitals filed for this visit. There were no vitals filed for this visit.  BREAST: No palpable lumps or nodules in bilateral reconstructed breast or axilla (exam performed in the presence of a chaperone)  LABORATORY DATA:  I have reviewed the data as listed    Latest Ref Rng & Units 02/20/2021    4:14 PM 12/30/2019    2:40 PM 07/07/2019    9:06 AM  CMP  Glucose 70 - 99 mg/dL 91  83  97   BUN 6 - 20 mg/dL '14  14  18   '$ Creatinine 0.44 - 1.00 mg/dL 0.83  0.81  0.88   Sodium 135 - 145 mmol/L 140  139  143   Potassium 3.5 - 5.1 mmol/L 4.2  3.9  4.6   Chloride 98 - 111 mmol/L 102  101  108   CO2 22 - 32 mmol/L '25  29  26   '$ Calcium 8.9 - 10.3 mg/dL 9.7  9.1  9.2   Total Protein 6.5 - 8.1 g/dL 7.6  7.4  7.0   Total Bilirubin 0.3 - 1.2 mg/dL 0.3  0.5  0.4   Alkaline Phos 38 - 126 U/L 63  62  70   AST 15 - 41 U/L '14  15  11   '$ ALT 0 - 44 U/L '12  13  10     '$ Lab Results  Component Value Date   WBC 7.6 02/20/2021   HGB 13.9 02/20/2021   HCT 40.5 02/20/2021   MCV 93.3 02/20/2021   PLT 248 02/20/2021   NEUTROABS 5.1 02/20/2021    ASSESSMENT & PLAN:  Malignant neoplasm of upper-outer quadrant of right breast in female, estrogen receptor negative ([place]) 10/11/2018:Patient palpated a right breast mass, mammogram revealed 1.6 cm mass, biopsy revealed grade 3 invasive ductal carcinoma ER 0%, PR 0%, Ki-67 20%, HER-2 3+ positive, T1CN0 stage IA   Treatment plan: 1. Neoadjuvant Taxol and Herceptin weekly x12.  Followed by Herceptin maintenance  therapy every 3 weeks to complete 1 year.  10/29/2018-01/26/2019, Herceptin maintenance subcutaneously until 10/20/2019 2.  02/22/2019:Bilateral mastectomies [name]) Left breast: benign tissue and no evidence of malignancy Right breast: no residual carcinoma, 2 right axillary lymph nodes negative    Upbeat clinical trial: No adverse effects from participating in the trial Lynch syndrome positive on genetic testing: Instructed her to take aspirin daily.  She is scheduling a colonoscopy with GI ---------------------------------------------------------------------------------------------------------------------------------------- Depression/anxiety/panic attacks: On Wellbutrin Chemo-induced peripheral neuropathy: Mild symptoms on her shins.  No further problems She and her husband got back together and she is very happy.    01/04/2020: Dr. Denman George hysterectomy.with BSO   Breast Cancer surveillance: 1. Mammograms: not indicated since she had bil mastectomies 2. Breast Exam: 05/29/2022 benign Return to clinic in 1 year for follow-up.    No orders of the defined types were placed in this encounter.  The patient has a good understanding of the  overall plan. she agrees with it. she will call with any problems that may develop before the next visit here. Total time spent: 30 mins including face to face time and time spent for planning, charting and co-ordination of care   Harriette Ohara, MD 05/29/22    I Gardiner Coins am acting as a Education administrator for [employer]  I have reviewed the above documentation for accuracy and completeness, and I agree with the above.

## 2022-05-29 NOTE — Assessment & Plan Note (Signed)
10/11/2018:Patient palpated a right breast mass, mammogram revealed 1.6 cm mass, biopsy revealed grade 3 invasive ductal carcinoma ER 0%, PR 0%, Ki-67 20%, HER-2 3+ positive, T1CN0 stage IA   Treatment plan: 1. Neoadjuvant Taxol and Herceptin weekly x12.  Followed by Herceptin maintenance therapy every 3 weeks to complete 1 year.  10/29/2018-01/26/2019, Herceptin maintenance subcutaneously until 10/20/2019 2.  02/22/2019:Bilateral mastectomies [name]) Left breast: benign tissue and no evidence of malignancy Right breast: no residual carcinoma, 2 right axillary lymph nodes negative    Upbeat clinical trial: No adverse effects from participating in the trial Lynch syndrome positive on genetic testing ---------------------------------------------------------------------------------------------------------------------------------------- Depression/anxiety/panic attacks: On Wellbutrin Chemo-induced peripheral neuropathy: Mild symptoms on her shins.  No further problems She and her husband got back together and she is very happy.    01/04/2020: Dr. Denman George hysterectomy.with BSO (profound nausea and vomiting post surgery)   Breast Cancer surveillance: 1. Mammograms: not indicated since she had bil mastectomies 2. Breast Exam: 05/29/2022 benign Return to clinic in 1 year for follow-up.

## 2022-06-20 ENCOUNTER — Telehealth: Payer: Self-pay | Admitting: *Deleted

## 2022-06-20 ENCOUNTER — Ambulatory Visit (AMBULATORY_SURGERY_CENTER): Payer: BC Managed Care – PPO | Admitting: *Deleted

## 2022-06-20 VITALS — Ht 64.0 in | Wt 155.0 lb

## 2022-06-20 DIAGNOSIS — Z1509 Genetic susceptibility to other malignant neoplasm: Secondary | ICD-10-CM

## 2022-06-20 MED ORDER — NA SULFATE-K SULFATE-MG SULF 17.5-3.13-1.6 GM/177ML PO SOLN: 1.0000 | Freq: Once | ORAL | 0 refills | Status: AC

## 2022-06-20 NOTE — Progress Notes (Signed)
No egg or soy allergy known to patient  No issues known to pt with past sedation with any surgeries or procedures Patient denies ever being told they had issues or difficulty with intubation  No FH of Malignant Hyperthermia Pt is not on diet pills Pt is not on  home 02  Pt is not on blood thinners  Pt denies issues with constipation  Pt is not on dialysis Pt denies any upcoming cardiac testing Pt encouraged to use to use Singlecare or Goodrx to reduce cost  Pt encouraged to use to use Singlecare or Goodrx to reduce cost  Patient's chart reviewed by Osvaldo Angst CNRA prior to previsit and patient appropriate for the [place].  Previsit completed and red dot placed by patient's name on their procedure day (on provider's schedule).  . Visit by phone Instructions reviewed with pt and pt states understanding. Instructed to review again prior to procedure. Pt states they will.  Instructions sent by phone with coupon and my chart

## 2022-06-20 NOTE — Telephone Encounter (Signed)
Unable to reach pt. LM with call back #

## 2022-06-25 ENCOUNTER — Encounter: Payer: Self-pay | Admitting: Gastroenterology

## 2022-07-09 ENCOUNTER — Ambulatory Visit (AMBULATORY_SURGERY_CENTER): Payer: BC Managed Care – PPO | Admitting: Gastroenterology

## 2022-07-09 ENCOUNTER — Encounter: Payer: Self-pay | Admitting: Gastroenterology

## 2022-07-09 VITALS — BP 137/68 | HR 67 | Temp 97.7°F | Resp 17 | Ht 63.0 in | Wt 155.0 lb

## 2022-07-09 DIAGNOSIS — Z08 Encounter for follow-up examination after completed treatment for malignant neoplasm: Secondary | ICD-10-CM | POA: Diagnosis present

## 2022-07-09 DIAGNOSIS — Z1509 Genetic susceptibility to other malignant neoplasm: Secondary | ICD-10-CM | POA: Diagnosis not present

## 2022-07-09 MED ORDER — SODIUM CHLORIDE 0.9 % IV SOLN: 500.0000 mL | Freq: Once | INTRAVENOUS | Status: DC

## 2022-07-09 NOTE — Patient Instructions (Signed)
Upper-Resume previous diet and medications. Awaiting pathology results. Repeat upper Endoscopy in 2-4 years with underlying Lynch syndrome history Lower-Repeat Colonoscopy in 1 year for surveillance. Use Fibercon 1-2 tablets by mouth daily.  Handout provided on Hiatal Hernia  YOU HAD AN ENDOSCOPIC PROCEDURE TODAY AT [facility]:   Refer to the procedure report that was given to you for any specific questions about what was found during the examination.  If the procedure report does not answer your questions, please call your gastroenterologist to clarify.  If you requested that your care partner not be given the details of your procedure findings, then the procedure report has been included in a sealed envelope for you to review at your convenience later.  YOU SHOULD EXPECT: Some feelings of bloating in the abdomen. Passage of more gas than usual.  Walking can help get rid of the air that was put into your GI tract during the procedure and reduce the bloating. If you had a lower endoscopy (such as a colonoscopy or flexible sigmoidoscopy) you may notice spotting of blood in your stool or on the toilet paper. If you underwent a bowel prep for your procedure, you may not have a normal bowel movement for a few days.  Please Note:  You might notice some irritation and congestion in your nose or some drainage.  This is from the oxygen used during your procedure.  There is no need for concern and it should clear up in a day or so.  SYMPTOMS TO REPORT IMMEDIATELY:  Following lower endoscopy (colonoscopy or flexible sigmoidoscopy):  Excessive amounts of blood in the stool  Significant tenderness or worsening of abdominal pains  Swelling of the abdomen that is new, acute  Fever of 100F or higher  Following upper endoscopy (EGD)  Vomiting of blood or coffee ground material  New chest pain or pain under the shoulder blades  Painful or persistently difficult swallowing  New shortness of  breath  Fever of 100F or higher  Black, tarry-looking stools  For urgent or emergent issues, a gastroenterologist can be reached at any hour by calling [phone]. Do not use MyChart messaging for urgent concerns.    DIET:  We do recommend a small meal at first, but then you may proceed to your regular diet.  Drink plenty of fluids but you should avoid alcoholic beverages for 24 hours.  ACTIVITY:  You should plan to take it easy for the rest of today and you should NOT DRIVE or use heavy machinery until tomorrow (because of the sedation medicines used during the test).    FOLLOW UP: Our staff will call the number listed on your records the next business day following your procedure.  We will call around 7:15- 8:00 am to check on you and address any questions or concerns that you may have regarding the information given to you following your procedure. If we do not reach you, we will leave a message.     If any biopsies were taken you will be contacted by phone or by letter within the next 1-3 weeks.  Please call us at [phone] if you have not heard about the biopsies in 3 weeks.    SIGNATURES/CONFIDENTIALITY: You and/or your care partner have signed paperwork which will be entered into your electronic medical record.  These signatures attest to the fact that that the information above on your After Visit Summary has been reviewed and is understood.  Full responsibility of the confidentiality of  this discharge information lies with you and/or your care-partner.

## 2022-07-09 NOTE — Op Note (Signed)
[facility] Patient Name: Carlen Klausen Procedure Date: 07/09/2022 2:15 PM MRN: [id number] Endoscopist: Justice Britain , MD, [id number] Age: 42 Referring MD:  Date of Birth: 19-Nov-1980 Gender: Female Account #: [id number] Procedure:                Colonoscopy Indications:              High risk colon cancer surveillance: Personal                            history of hereditary nonpolyposis colorectal                            cancer (Lynch Syndrome) Medicines:                Monitored Anesthesia Care Procedure:                Pre-Anesthesia Assessment:                           - Prior to the procedure, a History and Physical                            was performed, and patient medications and                            allergies were reviewed. The patient's tolerance of                            previous anesthesia was also reviewed. The risks                            and benefits of the procedure and the sedation                            options and risks were discussed with the patient.                            All questions were answered, and informed consent                            was obtained. Prior Anticoagulants: The patient has                            taken no anticoagulant or antiplatelet agents                            except for aspirin. ASA Grade Assessment: II - A                            patient with mild systemic disease. After reviewing                            the risks and benefits, the patient was deemed in  satisfactory condition to undergo the procedure.                           After obtaining informed consent, the colonoscope                            was passed under direct vision. Throughout the                            procedure, the patient's blood pressure, pulse, and                            oxygen saturations were monitored continuously. The                            Olympus CF-HQ190L SN  [id number] was introduced through                            the anus and advanced to the 3 cm into the ileum.                            The colonoscopy was performed without difficulty.                            The patient tolerated the procedure. The quality of                            the bowel preparation was good. The terminal ileum,                            ileocecal valve, appendiceal orifice, and rectum                            were photographed. Scope In: 2:25:02 PM Scope Out: 2:35:43 PM Scope Withdrawal Time: 0 hours 8 minutes 23 seconds  Total Procedure Duration: 0 hours 10 minutes 41 seconds  Findings:                 The digital rectal exam was normal. Pertinent                            negatives include no palpable rectal lesions.                           Normal mucosa was found in the entire colon.                           Non-bleeding non-thrombosed internal hemorrhoids                            were found during retroflexion, during perianal                            exam and during digital exam. The hemorrhoids were  Grade I (internal hemorrhoids that do not prolapse). Complications:            No immediate complications. Estimated Blood Loss:     Estimated blood loss: none. Impression:               - Normal mucosa in the entire examined colon.                           - Non-bleeding non-thrombosed internal hemorrhoids. Recommendation:           - The patient will be observed post-procedure,                            until all discharge criteria are met.                           - Discharge patient to home.                           - Patient has a contact number available for                            emergencies. The signs and symptoms of potential                            delayed complications were discussed with the                            patient. Return to normal activities tomorrow.                             Written discharge instructions were provided to the                            patient.                           - High fiber diet.                           - Use FiberCon 1-2 tablets PO daily.                           - Continue present medications.                           - Repeat colonoscopy in 1 year for surveillance due                            to history of Lynch syndrome.                           - The findings and recommendations were discussed                            with the patient.                           -  The findings and recommendations were discussed                            with the patient's family. Justice Britain, MD 07/09/2022 2:49:20 PM

## 2022-07-09 NOTE — Progress Notes (Signed)
Pt's states no medical or surgical changes since previsit or office visit. 

## 2022-07-09 NOTE — Progress Notes (Signed)
Called to room to assist during endoscopic procedure.  Patient ID and intended procedure confirmed with present staff. Received instructions for my participation in the procedure from the performing physician.  

## 2022-07-09 NOTE — Progress Notes (Signed)
GASTROENTEROLOGY PROCEDURE H&P NOTE   Primary Care Physician: [name], PA  HPI: [name] is a 42 y.o. female who presents for EGD/colonoscopy for history of underlying Lynch syndrome (last endoscopy in 2020 and last colonoscopy in 2023).  Past Medical History:  Diagnosis Date   Anxiety    over cancer diagnosis   Breast cancer ([place])    right breast cancer chemo finished per pt   Complication of anesthesia    Depression    Family history of breast cancer    Lynch syndrome    PONV (postoperative nausea and vomiting)    Scleroderma ([place])    Past Surgical History:  Procedure Laterality Date   BREAST RECONSTRUCTION WITH PLACEMENT OF TISSUE EXPANDER AND FLEX HD (ACELLULAR HYDRATED DERMIS) Bilateral 02/22/2019   Procedure: BILATERAL BREAST RECONSTRUCTION WITH PLACEMENT OF TISSUE EXPANDER AND FLEX HD (ACELLULAR HYDRATED DERMIS);  Surgeon: Irene Limbo, MD;  Location: [facility];  Service: Plastics;  Laterality: Bilateral;   CERVICAL FUSION  2019   CESAREAN SECTION  2005   CESAREAN SECTION  02/27/2011   Procedure: CESAREAN SECTION;  Surgeon: Shon Millet II;  Location: [facility];  Service: Gynecology;  Laterality: N/A;  repeat   COLONOSCOPY     LIPOSUCTION WITH LIPOFILLING Bilateral 05/31/2019   Procedure: LIPOFILLING FROM ABDOMEN TO BILATERAL CHEST;  Surgeon: Irene Limbo, MD;  Location: [facility];  Service: Plastics;  Laterality: Bilateral;   NIPPLE SPARING MASTECTOMY WITH SENTINEL LYMPH NODE BIOPSY Bilateral 02/22/2019   Procedure: RIGHT NIPPLE SPARING MASTECTOMY AND LEFT RISK REDUCING NIPPLE SPARING MASTECTOMY WITH RIGHT AXILLARY SENTINEL LYMPH NODE BIOPSY;  Surgeon: Rolm Bookbinder, MD;  Location: [facility];  Service: General;  Laterality: Bilateral;  BILATERAL PECTORAL BLOCK   PORT-A-CATH REMOVAL Right 05/31/2019   Procedure: REMOVAL PORT-A-CATH;  Surgeon: Irene Limbo, MD;  Location: [facility];  Service: Plastics;  Laterality: Right;   PORTACATH PLACEMENT Right 10/20/2018   Procedure: INSERTION PORT-A-CATH WITH ULTRASOUND;  Surgeon: Rolm Bookbinder, MD;  Location: [facility];  Service: General;  Laterality: Right;   REMOVAL OF BILATERAL TISSUE EXPANDERS WITH PLACEMENT OF BILATERAL BREAST IMPLANTS Bilateral 05/31/2019   Procedure: REMOVAL OF BILATERAL TISSUE EXPANDERS WITH PLACEMENT OF SILICONE BILATERAL BREAST IMPLANTS;  Surgeon: Irene Limbo, MD;  Location: [facility];  Service: Plastics;  Laterality: Bilateral;   ROBOTIC ASSISTED LAPAROSCOPIC HYSTERECTOMY AND SALPINGECTOMY N/A 01/04/2020   Procedure: XI ROBOTIC ASSISTED LAPAROSCOPIC HYSTERECTOMY AND BILATERAL SALPINGECTOMY;  Surgeon: [name], MD;  Location: [facility];  Service: Gynecology;  Laterality: N/A;   Current Outpatient Medications  Medication Sig Dispense Refill   buPROPion (WELLBUTRIN XL) 150 MG 24 hr tablet TAKE 1 TABLET(150 MG) BY MOUTH DAILY 90 tablet 3   No current facility-administered medications for this visit.    Current Outpatient Medications:    buPROPion (WELLBUTRIN XL) 150 MG 24 hr tablet, TAKE 1 TABLET(150 MG) BY MOUTH DAILY, Disp: 90 tablet, Rfl: 3 Allergies  Allergen Reactions   Methocarbamol Itching   Family History  Problem Relation Age of Onset   Rectal cancer Mother    Ulcerative colitis Mother    Diabetes Mother    Colon polyps Father    Diabetes Father    Rheum arthritis Maternal Grandmother    Heart disease Maternal Grandfather    Hashimoto's thyroiditis Son    Colon cancer Cousin        '@nd'$  cousin   Ulcerative colitis Niece  Breast cancer Other    Breast cancer Other    Breast cancer Other        paternal great grandmother   Esophageal cancer Neg Hx    Stomach cancer Neg Hx    Social History   Socioeconomic History   Marital status: Married    Spouse name: Not on file   Number of children: 2   Years of  education: Not on file   Highest education level: Not on file  Occupational History   Occupation: [employer]   Occupation: treatment coordinator  Tobacco Use   Smoking status: Never   Smokeless tobacco: Never  Vaping Use   Vaping Use: Never used  Substance and Sexual Activity   Alcohol use: No   Drug use: No   Sexual activity: Yes    Birth control/protection: Surgical    Comment: husband has had vasectomy  Other Topics Concern   Not on file  Social History Narrative   Not on file   Social Determinants of Health   Financial Resource Strain: Not on file  Food Insecurity: Not on file  Transportation Needs: No Transportation Needs (10/21/2018)   PRAPARE - Hydrologist (Medical): No    Lack of Transportation (Non-Medical): No  Physical Activity: Not on file  Stress: Not on file  Social Connections: Not on file  Intimate Partner Violence: Not on file    Physical Exam: There were no vitals filed for this visit. There is no height or weight on file to calculate BMI. GEN: NAD EYE: Sclerae anicteric ENT: MMM CV: Non-tachycardic GI: Soft, NT/ND NEURO:  Alert & Oriented x 3  Lab Results: No results for input(s): "WBC", "HGB", "HCT", "PLT" in the last 72 hours. BMET No results for input(s): "NA", "K", "CL", "CO2", "GLUCOSE", "BUN", "CREATININE", "CALCIUM" in the last 72 hours. LFT No results for input(s): "PROT", "ALBUMIN", "AST", "ALT", "ALKPHOS", "BILITOT", "BILIDIR", "IBILI" in the last 72 hours. PT/INR No results for input(s): "LABPROT", "INR" in the last 72 hours.   Impression / Plan: This is a 42 y.o.female who presents for EGD/colonoscopy for history of underlying Lynch syndrome (last endoscopy in 2020 and last colonoscopy in 2023).  The risks and benefits of endoscopic evaluation/treatment were discussed with the patient and/or family; these include but are not limited to the risk of perforation, infection, bleeding, missed  lesions, lack of diagnosis, severe illness requiring hospitalization, as well as anesthesia and sedation related illnesses.  The patient's history has been reviewed, patient examined, no change in status, and deemed stable for procedure.  The patient and/or family is agreeable to proceed.    Justice Britain, MD [facility] Advanced Endoscopy Office # [id number]

## 2022-07-09 NOTE — Op Note (Signed)
[name] Patient Name: [name] Procedure Date: 07/09/2022 2:15 PM MRN: [id number] Endoscopist: Justice Britain , MD, [id number] Age: 42 Referring MD:  Date of Birth: 09/15/1980 Gender: Female Account #: [id number] Procedure:                Upper GI endoscopy Indications:              Surveillance for malignancy secondary to Lynch                            Syndrome Medicines:                Monitored Anesthesia Care Procedure:                Pre-Anesthesia Assessment:                           - Prior to the procedure, a History and Physical                            was performed, and patient medications and                            allergies were reviewed. The patient's tolerance of                            previous anesthesia was also reviewed. The risks                            and benefits of the procedure and the sedation                            options and risks were discussed with the patient.                            All questions were answered, and informed consent                            was obtained. Prior Anticoagulants: The patient has                            taken no anticoagulant or antiplatelet agents                            except for aspirin. ASA Grade Assessment: II - A                            patient with mild systemic disease. After reviewing                            the risks and benefits, the patient was deemed in                            satisfactory condition to undergo the procedure.  After obtaining informed consent, the endoscope was                            passed under direct vision. Throughout the                            procedure, the patient's blood pressure, pulse, and                            oxygen saturations were monitored continuously. The                            GIF [id number] [id number] was introduced through the                            mouth, and advanced to the  second part of duodenum.                            The upper GI endoscopy was accomplished without                            difficulty. The patient tolerated the procedure. Scope In: Scope Out: Findings:                 No gross lesions were noted in the entire esophagus.                           The Z-line was irregular and was found 33 cm from                            the incisors.                           A 2 cm hiatal hernia was present.                           Patchy mildly erythematous mucosa without bleeding                            was found in the entire examined stomach. Biopsies                            were taken with a cold forceps for histology and                            Helicobacter pylori testing.                           No gross lesions were noted in the duodenal bulb,                            in the first portion of the duodenum and in the  second portion of the duodenum. Complications:            No immediate complications. Estimated Blood Loss:     Estimated blood loss was minimal. Impression:               - No gross lesions in the entire esophagus. Z-line                            irregular, 33 cm from the incisors.                           - 2 cm hiatal hernia.                           - Erythematous mucosa in the stomach. Biopsied.                           - No gross lesions in the duodenal bulb, in the                            first portion of the duodenum and in the second                            portion of the duodenum. Recommendation:           - Proceed to scheduled colonoscopy.                           - Continue present medications.                           - Await pathology results.                           - Repeat upper endoscopy in 2-4 years for                            surveillance based on underlying Lynch syndrome                            histo (unless pathology indicated earlier need  ry).                           - The findings and recommendations were discussed                            with the patient.                           - The findings and recommendations were discussed                            with the patient's family. Justice Britain, MD 07/09/2022 2:46:28 PM

## 2022-07-09 NOTE — Progress Notes (Signed)
PT taken to PACU. Monitors in place. VSS. Report given to RN. 

## 2022-07-10 ENCOUNTER — Telehealth: Payer: Self-pay | Admitting: *Deleted

## 2022-07-10 NOTE — Telephone Encounter (Signed)
  Follow up Call-    Row Labels 07/09/2022    1:25 PM 06/14/2021    8:31 AM 06/12/2020    8:55 AM  Call back number   Section Header. No data exists in this row.     Post procedure Call Back phone  #   [phone]  Permission to leave phone message   Yes Yes Yes     Patient questions:  Do you have a fever, pain , or abdominal swelling? No. Pain Score  0 *  Have you tolerated food without any problems? No.  Have you been able to return to your normal activities? Yes.    Do you have any questions about your discharge instructions: Diet   No. Medications  No. Follow up visit  No.  Do you have questions or concerns about your Care? No.  Actions: * If pain score is 4 or above: No action needed, pain <4.

## 2022-07-16 ENCOUNTER — Encounter: Payer: Self-pay | Admitting: Gastroenterology

## 2022-09-23 ENCOUNTER — Telehealth: Payer: Self-pay | Admitting: Radiology

## 2022-09-23 NOTE — Telephone Encounter (Signed)
WF [id number] Understanding and Predicting Breast Cancer Events after Treatment (UPBEAT)   09/23/2022  PHONE CALL (4 YR): LVM for patient to return call to complete 4 yr follow-up visit for the above mentioned study. Will attempt to call patient at a later date.   Merri Brunette, RT(R)(T) Clinical Research Coordinator

## 2022-10-08 ENCOUNTER — Telehealth: Payer: Self-pay | Admitting: Radiology

## 2022-10-08 NOTE — Telephone Encounter (Signed)
WF [id number] Understanding and Predicting Breast Cancer Events after Treatment (UPBEAT)   10/08/22  4 YR PHONE CALL: Confirmed I was speaking with [name] . Informed patient reason for call is to complete yearly follow-up for the above mentioned study. Patient states she is doing well and no limitations. Patient has had no cardiac issues or events. Thanked patient for her time and informed her we would speak again in about 1 yr.  Merri Brunette, RT(R)(T) Clinical Research Coordinator

## 2023-03-02 ENCOUNTER — Encounter: Payer: Self-pay | Admitting: Genetic Counselor

## 2023-06-02 ENCOUNTER — Encounter: Payer: Self-pay | Admitting: Gastroenterology

## 2023-06-02 ENCOUNTER — Inpatient Hospital Stay: Payer: BC Managed Care – PPO | Attending: Hematology and Oncology | Admitting: Hematology and Oncology

## 2023-06-02 VITALS — BP 129/71 | HR 80 | Temp 98.2°F | Resp 18 | Wt 165.7 lb

## 2023-06-02 DIAGNOSIS — Z171 Estrogen receptor negative status [ER-]: Secondary | ICD-10-CM | POA: Insufficient documentation

## 2023-06-02 DIAGNOSIS — C50411 Malignant neoplasm of upper-outer quadrant of right female breast: Secondary | ICD-10-CM | POA: Diagnosis not present

## 2023-06-02 NOTE — Assessment & Plan Note (Signed)
10/11/2018:Patient palpated a right breast mass, mammogram revealed 1.6 cm mass, biopsy revealed grade 3 invasive ductal carcinoma ER 0%, PR 0%, Ki-67 20%, HER-2 3+ positive, T1CN0 stage IA   Treatment plan: 1. Neoadjuvant Taxol and Herceptin weekly x12.  Followed by Herceptin maintenance therapy every 3 weeks to complete 1 year.  10/29/2018-01/26/2019, Herceptin maintenance subcutaneously until 10/20/2019 2.  02/22/2019:Bilateral mastectomies [name]) Left breast: benign tissue and no evidence of malignancy Right breast: no residual carcinoma, 2 right axillary lymph nodes negative    Upbeat clinical trial: No adverse effects from participating in the trial Lynch syndrome positive on genetic testing: Instructed her to take aspirin daily.  She is scheduling a colonoscopy with GI ---------------------------------------------------------------------------------------------------------------------------------------- Depression/anxiety/panic attacks: On Wellbutrin Chemo-induced peripheral neuropathy: Mild symptoms on her shins.  No further problems She and her husband got back together and she is very happy.    01/04/2020: Dr. Andrey Farmer hysterectomy.with BSO   Breast Cancer surveillance: 1. Mammograms: not indicated since she had bil mastectomies 2. Breast Exam: 06/02/2023 benign Return to clinic in 1 year for follow-up.

## 2023-06-02 NOTE — Progress Notes (Signed)
Patient Care Team: Milus Height, [place] as PCP - General (Nurse Practitioner) Cynda Familia, RN (Inactive) as Registered Nurse Carver Fila, MD as Consulting Physician (Gynecologic Oncology)  DIAGNOSIS:  Encounter Diagnosis  Name Primary?   Malignant neoplasm of upper-outer quadrant of right breast in female, estrogen receptor negative (HCC) Yes    SUMMARY OF ONCOLOGIC HISTORY: Oncology History  Malignant neoplasm of upper-outer quadrant of right breast in female, estrogen receptor negative (HCC)  10/11/2018 Initial Diagnosis   Patient palpated a right breast mass, mammogram revealed 1.6 cm mass, biopsy revealed grade 3 invasive ductal carcinoma ER 0%, PR 0%, Ki-67 20%, HER-2 3+ positive, T1CN0 stage IA   10/19/2018 Cancer Staging   Staging form: Breast, AJCC 8th Edition - Clinical stage from 10/19/2018: Stage IA (cT1c, cN0, cM0, G3, ER-, PR-, HER2+) - Signed by Serena Croissant, MD on 10/19/2018   10/29/2018 - 01/20/2019 Chemotherapy   The patient had trastuzumab (HERCEPTIN) 300 mg in sodium chloride 0.9 % 250 mL chemo infusion, 294 mg, Intravenous,  Once, 2 of 2 cycles Administration: 300 mg (10/29/2018), 150 mg (11/03/2018), 150 mg (12/02/2018), 150 mg (11/12/2018), 150 mg (11/25/2018), 150 mg (12/09/2018) PACLitaxel (TAXOL) 150 mg in sodium chloride 0.9 % 250 mL chemo infusion (</= 80mg /m2), 80 mg/m2 = 150 mg, Intravenous,  Once, 3 of 3 cycles Dose modification: 65 mg/m2 (original dose 80 mg/m2, Cycle 3, Reason: Dose not tolerated) Administration: 150 mg (10/29/2018), 150 mg (11/03/2018), 150 mg (12/02/2018), 150 mg (11/12/2018), 150 mg (11/25/2018), 150 mg (12/09/2018), 150 mg (12/16/2018), 150 mg (12/23/2018), 120 mg (12/30/2018), 120 mg (01/06/2019), 120 mg (01/13/2019), 120 mg (01/20/2019) trastuzumab-dkst (OGIVRI) 150 mg in sodium chloride 0.9 % 250 mL chemo infusion, 150 mg (100 % of original dose 150 mg), Intravenous,  Once, 2 of 2 cycles Dose modification: 150 mg (original dose 150 mg, Cycle 2,  Reason: Other (see comments), Comment: Biosimilar Conversion), 450 mg (original dose 150 mg, Cycle 3, Reason: Other (see comments), Comment: Biosimilar Conversion) Administration: 150 mg (12/16/2018), 150 mg (12/23/2018), 150 mg (12/30/2018), 150 mg (01/06/2019), 150 mg (01/13/2019), 450 mg (01/20/2019)  for chemotherapy treatment.    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  The Common Hereditary Gene Panel offered by [facility] includes sequencing and/or deletion duplication testing of the following 48 genes: APC, ATM, AXIN2, BARD1, BMPR1A, BRCA1, BRCA2, BRIP1, CDH1, CDK4, CDKN2A (p14ARF), CDKN2A (p16INK4a), CHEK2, CTNNA1, DICER1, EPCAM (Deletion/duplication testing only), GREM1 (promoter region deletion/duplication testing only), KIT, MEN1, MLH1, MSH2, MSH3, MSH6, MUTYH, NBN, NF1, NHTL1, PALB2, PDGFRA, PMS2, POLD1, POLE, PTEN, RAD50, RAD51C, RAD51D, RNF43, SDHB, SDHC, SDHD, SMAD4, SMARCA4. STK11, TP53, TSC1, TSC2, and VHL.  The following genes were evaluated for sequence changes only: SDHA and HOXB13 c.251G>A variant only.   Two VUS's also found - one in ATM c.133C>T and the other in POLE c.1280C>T.  These will not affect medical management. The report date is November 12, 2018.  UPDATE: ATM c.133C>T  VUS has been reclassified as Benign.  The amended report is November 10, 2022.   02/10/2019 - 10/20/2019 Chemotherapy   Patient is on Treatment Plan : BREAST Trastuzumab q21d     02/22/2019 Surgery   Bilateral mastectomies Dw[facility] Sarna & [name]) Left breast: benign tissue and no evidence of malignancy Right breast: no residual carcinoma, 2 right axillary lymph nodes negative      CHIEF COMPLIANT: Surveillance of breast cancer  HISTORY OF PRESENT ILLNESS:   History of  Present Illness   The patient, with a history of breast cancer, presents for a follow-up visit.  She is almost five years post-diagnosis of breast cancer and is  doing well. She is here for a follow-up visit to monitor her condition. A novel blood test for surveillance of breast cancer recurrence, called Gardant, is available and can detect recurrence 1-2 years before it becomes clinically apparent.  She has been taking 81 mg of aspirin, which was recommended by her previous doctors, despite differing opinions on the dosage. She experiences susceptibility to bleeding even with the 81 mg dose, which is a concern for her.  She describes her kidneys as 'weird' and mentions that she has undergone a complete workup, which did not reveal any significant findings. She needs to drink close to a gallon of water daily to feel okay and avoid backaches.  She underwent a hysterectomy in the past and reports doing well hormonally since the procedure, although she admits to feeling a little scared at times. 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 She has two children, aged 74 and 71. Her 9 year old is currently attending community college with plans to transfer to a state university or [employer].         ALLERGIES:  is allergic to methocarbamol.  MEDICATIONS:  Current Outpatient Medications  Medication Sig Dispense Refill   aspirin EC 81 MG tablet Take 81 mg by mouth daily. Swallow whole.     buPROPion (WELLBUTRIN XL) 150 MG 24 hr tablet TAKE 1 TABLET(150 MG) BY MOUTH DAILY 90 tablet 3   No current facility-administered medications for this visit.    PHYSICAL EXAMINATION: ECOG PERFORMANCE STATUS: 1 - Symptomatic but completely ambulatory  Vitals:   06/02/23 1533  BP: 129/71  Pulse: 80  Resp: 18  Temp: 98.2 F (36.8 C)  SpO2: 100%   Filed Weights   06/02/23 1533  Weight: 165 lb 11.2 oz (75.2 kg)      LABORATORY DATA:  I have reviewed the data as listed    Latest Ref Rng & Units 02/20/2021    4:14 PM 12/30/2019    2:40 PM 07/07/2019    9:06 AM  CMP  Glucose 70 - 99 mg/dL 91  83  97   BUN 6 - 20  mg/dL 14  14  18    Creatinine 0.44 - 1.00 mg/dL 7.82  9.56  2.13   Sodium 135 - 145 mmol/L 140  139  143   Potassium 3.5 - 5.1 mmol/L 4.2  3.9  4.6   Chloride 98 - 111 mmol/L 102  101  108   CO2 22 - 32 mmol/L 25  29  26    Calcium 8.9 - 10.3 mg/dL 9.7  9.1  9.2   Total Protein 6.5 - 8.1 g/dL 7.6  7.4  7.0   Total Bilirubin 0.3 - 1.2 mg/dL 0.3  0.5  0.4   Alkaline Phos 38 - 126 U/L 63  62  70   AST 15 - 41 U/L 14  15  11    ALT 0 - 44 U/L 12  13  10      Lab Results  Component Value Date   WBC 7.6 02/20/2021   HGB 13.9 02/20/2021   HCT 40.5 02/20/2021   MCV 93.3 02/20/2021   PLT 248 02/20/2021   NEUTROABS 5.1 02/20/2021    ASSESSMENT & PLAN:  Malignant neoplasm of upper-outer quadrant of right breast in female, estrogen receptor negative (HCC)  10/11/2018:Patient palpated a right breast mass, mammogram revealed 1.6 cm mass, biopsy revealed grade 3 invasive ductal carcinoma ER 0%, PR 0%, Ki-67 20%, HER-2 3+ positive, T1CN0 stage IA   Treatment plan: 1. Neoadjuvant Taxol and Herceptin weekly x12.  Followed by Herceptin maintenance therapy every 3 weeks to complete 1 year.  10/29/2018-01/26/2019, Herceptin maintenance subcutaneously until 10/20/2019 2.  02/22/2019:Bilateral mastectomies [facility] S[facility] [name]) Left breast: benign tissue and no evidence of malignancy Right breast: no residual carcinoma, 2 right axillary lymph nodes negative    Upbeat clinical trial: No adverse effects from participating in the trial Lynch syndrome positive on genetic testing: Instructed her to take aspirin daily.  She is scheduling a colonoscopy with GI ---------------------------------------------------------------------------------------------------------------------------------------- Depression/anxiety/panic attacks: On Wellbutrin Chemo-induced peripheral neuropathy: Mild symptoms on her shins.  No further problems She and her husband got back together and she is very happy.    01/04/2020: Dr. Andrey Farmer  hysterectomy.with BSO   Breast Cancer surveillance: 1. Mammograms: not indicated since she had bil mastectomies 2. Breast Exam: 06/02/2023 benign Recommended guardant reveal for MRD monitoring Return to clinic in 1 year for follow-up.    No orders of the defined types were placed in this encounter.  The patient has a good understanding of the overall plan. she agrees with it. she will call with any problems that may develop before the next visit here. Total time spent: 30 mins including face to face time and time spent for planning, charting and co-ordination of care   Tamsen Meek, MD 06/02/23

## 2023-06-03 ENCOUNTER — Telehealth: Payer: Self-pay

## 2023-06-03 NOTE — Telephone Encounter (Signed)
Per md orders entered for Guardant Reveal and all supported documents faxed to [phone]. Faxed confirmation was received.

## 2023-06-23 ENCOUNTER — Encounter: Payer: Self-pay | Admitting: Hematology and Oncology

## 2023-06-29 ENCOUNTER — Encounter: Payer: Self-pay | Admitting: Hematology and Oncology

## 2023-06-30 ENCOUNTER — Other Ambulatory Visit: Payer: Self-pay | Admitting: *Deleted

## 2023-06-30 ENCOUNTER — Encounter: Payer: Self-pay | Admitting: Hematology and Oncology

## 2023-06-30 ENCOUNTER — Ambulatory Visit (AMBULATORY_SURGERY_CENTER): Payer: BLUE CROSS/BLUE SHIELD

## 2023-06-30 VITALS — Ht 63.0 in | Wt 145.0 lb

## 2023-06-30 DIAGNOSIS — Z1509 Genetic susceptibility to other malignant neoplasm: Secondary | ICD-10-CM

## 2023-06-30 MED ORDER — NA SULFATE-K SULFATE-MG SULF 17.5-3.13-1.6 GM/177ML PO SOLN: 1.0000 | Freq: Once | ORAL | 0 refills | Status: AC

## 2023-06-30 MED ORDER — BUPROPION HCL ER (XL) 150 MG PO TB24: ORAL_TABLET | ORAL | 3 refills | Status: AC

## 2023-06-30 NOTE — Progress Notes (Signed)

## 2023-07-01 ENCOUNTER — Telehealth: Payer: Self-pay

## 2023-07-01 NOTE — Telephone Encounter (Signed)
 Called pt per MD to advise Guardant Reveal testing was negative/not detected. Pt verbalized understanding of results and knows [facility]ant will be in touch to schedule 6 mo repeat lab.

## 2023-07-08 ENCOUNTER — Encounter: Payer: Self-pay | Admitting: Hematology and Oncology

## 2023-07-28 ENCOUNTER — Encounter: Payer: Self-pay | Admitting: Gastroenterology

## 2023-07-31 ENCOUNTER — Other Ambulatory Visit: Payer: Self-pay | Admitting: Gastroenterology

## 2023-07-31 ENCOUNTER — Ambulatory Visit: Payer: BC Managed Care – PPO | Admitting: Gastroenterology

## 2023-07-31 ENCOUNTER — Encounter: Payer: Self-pay | Admitting: Gastroenterology

## 2023-07-31 VITALS — BP 110/69 | HR 64 | Temp 98.3°F | Resp 11 | Ht 63.0 in | Wt 163.6 lb

## 2023-07-31 DIAGNOSIS — K641 Second degree hemorrhoids: Secondary | ICD-10-CM

## 2023-07-31 DIAGNOSIS — K635 Polyp of colon: Secondary | ICD-10-CM

## 2023-07-31 DIAGNOSIS — Z1211 Encounter for screening for malignant neoplasm of colon: Secondary | ICD-10-CM

## 2023-07-31 DIAGNOSIS — Z1509 Genetic susceptibility to other malignant neoplasm: Secondary | ICD-10-CM

## 2023-07-31 DIAGNOSIS — D125 Benign neoplasm of sigmoid colon: Secondary | ICD-10-CM

## 2023-07-31 MED ORDER — SODIUM CHLORIDE 0.9 % IV SOLN: 500.0000 mL | INTRAVENOUS | Status: DC

## 2023-07-31 NOTE — Progress Notes (Signed)
 VS by [facility]  Pt's states no medical or surgical changes since previsit or office visit.

## 2023-07-31 NOTE — Progress Notes (Signed)
 Called to room to assist during endoscopic procedure.  Patient ID and intended procedure confirmed with present staff. Received instructions for my participation in the procedure from the performing physician.

## 2023-07-31 NOTE — Op Note (Signed)
 [facility] Patient Name: [name] Procedure Date: 07/31/2023 9:31 AM MRN: [id number] Endoscopist: Corliss Parish , MD, [id number] Age: 43 Referring MD:  Date of Birth: Jan 18, 1981 Gender: Female Account #: [id number] Procedure:                Colonoscopy Indications:              High risk colon cancer surveillance: Personal                            history of hereditary nonpolyposis colorectal                            cancer (Lynch Syndrome) Medicines:                Monitored Anesthesia Care Procedure:                Pre-Anesthesia Assessment:                           - Prior to the procedure, a History and Physical                            was performed, and patient medications and                            allergies were reviewed. The patient's tolerance of                            previous anesthesia was also reviewed. The risks                            and benefits of the procedure and the sedation                            options and risks were discussed with the patient.                            All questions were answered, and informed consent                            was obtained. Prior Anticoagulants: The patient has                            taken no anticoagulant or antiplatelet agents                            except for aspirin. ASA Grade Assessment: II - A                            patient with mild systemic disease. After reviewing                            the risks and benefits, the patient was deemed in  satisfactory condition to undergo the procedure.                           After obtaining informed consent, the colonoscope                            was passed under direct vision. Throughout the                            procedure, the patient's blood pressure, pulse, and                            oxygen saturations were monitored continuously. The                            Olympus Scope SN:  [id number] was introduced through                            the anus and advanced to the the cecum, identified                            by appendiceal orifice and ileocecal valve. The                            colonoscopy was performed without difficulty. The                            patient tolerated the procedure. The quality of the                            bowel preparation was good. The ileocecal valve,                            appendiceal orifice, and rectum were photographed. Scope In: 9:59:28 AM Scope Out: 10:10:07 AM Scope Withdrawal Time: 0 hours 8 minutes 17 seconds  Total Procedure Duration: 0 hours 10 minutes 39 seconds  Findings:                 The digital rectal exam findings include                            hemorrhoids. Pertinent negatives include no                            palpable rectal lesions.                           A 2 mm polyp was found in the sigmoid colon. The                            polyp was sessile. The polyp was removed with a                            cold snare. Resection and retrieval were complete.  Normal mucosa was found in the entire colon                            otherwise.                           Non-bleeding non-thrombosed internal hemorrhoids                            were found during retroflexion, during perianal                            exam and during digital exam. The hemorrhoids were                            Grade II (internal hemorrhoids that prolapse but                            reduce spontaneously). Complications:            No immediate complications. Estimated Blood Loss:     Estimated blood loss was minimal. Impression:               - Hemorrhoids found on digital rectal exam.                           - One 2 mm polyp in the sigmoid colon, removed with                            a cold snare. Resected and retrieved.                           - Normal mucosa in the entire examined  colon                            otherwise.                           - Non-bleeding non-thrombosed internal hemorrhoids. Recommendation:           - The patient will be observed post-procedure,                            until all discharge criteria are met.                           - Discharge patient to home.                           - Patient has a contact number available for                            emergencies. The signs and symptoms of potential                            delayed complications were discussed with the  patient. Return to normal activities tomorrow.                            Written discharge instructions were provided to the                            patient.                           - High fiber diet.                           - Use FiberCon 1-2 tablets PO daily.                           - Continue present medications.                           - Await pathology results.                           - Repeat colonoscopy in 1 year for surveillance no                            matter pathology due to Lynch Syndrome.                           - The findings and recommendations were discussed                            with the patient.                           - The findings and recommendations were discussed                            with the patient's family. Corliss Parish, MD 07/31/2023 10:13:55 AM

## 2023-07-31 NOTE — Patient Instructions (Signed)
 Resume previous diet (high fiber diet is recommended) Continue present medications Await pathology results  Handouts/information given for polyps and high fiber diet  YOU HAD AN ENDOSCOPIC PROCEDURE TODAY AT THE [facility]:   Refer to the procedure report that was given to you for any specific questions about what was found during the examination.  If the procedure report does not answer your questions, please call your gastroenterologist to clarify.  If you requested that your care partner not be given the details of your procedure findings, then the procedure report has been included in a sealed envelope for you to review at your convenience later.  YOU SHOULD EXPECT: Some feelings of bloating in the abdomen. Passage of more gas than usual.  Walking can help get rid of the air that was put into your GI tract during the procedure and reduce the bloating. If you had a lower endoscopy (such as a colonoscopy or flexible sigmoidoscopy) you may notice spotting of blood in your stool or on the toilet paper. If you underwent a bowel prep for your procedure, you may not have a normal bowel movement for a few days.  Please Note:  You might notice some irritation and congestion in your nose or some drainage.  This is from the oxygen used during your procedure.  There is no need for concern and it should clear up in a day or so.  SYMPTOMS TO REPORT IMMEDIATELY:  Following lower endoscopy (colonoscopy):  Excessive amounts of blood in the stool  Significant tenderness or worsening of abdominal pains  Swelling of the abdomen that is new, acute  Fever of 100F or higher  For urgent or emergent issues, a gastroenterologist can be reached at any hour by calling [phone]. Do not use MyChart messaging for urgent concerns.   DIET:  We do recommend a small meal at first, but then you may proceed to your regular diet.  Drink plenty of fluids but you should avoid alcoholic beverages for 24  hours.  ACTIVITY:  You should plan to take it easy for the rest of today and you should NOT DRIVE or use heavy machinery until tomorrow (because of the sedation medicines used during the test).    FOLLOW UP: Our staff will call the number listed on your records the next business day following your procedure.  We will call around 7:15- 8:00 am to check on you and address any questions or concerns that you may have regarding the information given to you following your procedure. If we do not reach you, we will leave a message.     If any biopsies were taken you will be contacted by phone or by letter within the next 1-3 weeks.  Please call us at [phone] if you have not heard about the biopsies in 3 weeks.   SIGNATURES/CONFIDENTIALITY: You and/or your care partner have signed paperwork which will be entered into your electronic medical record.  These signatures attest to the fact that that the information above on your After Visit Summary has been reviewed and is understood.  Full responsibility of the confidentiality of this discharge information lies with you and/or your care-partner.

## 2023-07-31 NOTE — Progress Notes (Signed)
 GASTROENTEROLOGY PROCEDURE H&P NOTE   Primary Care Physician: Milus Height, PA  HPI: [name] is a 43 y.o. female who presents for Colonoscopy for history of Lynch Syndrome.  Past Medical History:  Diagnosis Date   Anxiety    over cancer diagnosis   Breast cancer (HCC)    right breast cancer chemo finished per pt   Complication of anesthesia    Depression    Family history of breast cancer    Lynch syndrome    PONV (postoperative nausea and vomiting)    Scleroderma (HCC)    Past Surgical History:  Procedure Laterality Date   BREAST RECONSTRUCTION WITH PLACEMENT OF TISSUE EXPANDER AND FLEX HD (ACELLULAR HYDRATED DERMIS) Bilateral 02/22/2019   Procedure: BILATERAL BREAST RECONSTRUCTION WITH PLACEMENT OF TISSUE EXPANDER AND FLEX HD (ACELLULAR HYDRATED DERMIS);  Surgeon: Glenna Fellows, MD;  Location: [facility];  Service: Plastics;  Laterality: Bilateral;   CERVICAL FUSION  2019   CESAREAN SECTION  2005   CESAREAN SECTION  02/27/2011   Procedure: CESAREAN SECTION;  Surgeon: Roselle Locus II;  Location: [facility];  Service: Gynecology;  Laterality: N/A;  repeat   COLONOSCOPY     LIPOSUCTION WITH LIPOFILLING Bilateral 05/31/2019   Procedure: LIPOFILLING FROM ABDOMEN TO BILATERAL CHEST;  Surgeon: Glenna Fellows, MD;  Location: [facility];  Service: Plastics;  Laterality: Bilateral;   NIPPLE SPARING MASTECTOMY WITH SENTINEL LYMPH NODE BIOPSY Bilateral 02/22/2019   Procedure: RIGHT NIPPLE SPARING MASTECTOMY AND LEFT RISK REDUCING NIPPLE SPARING MASTECTOMY WITH RIGHT AXILLARY SENTINEL LYMPH NODE BIOPSY;  Surgeon: Emelia Loron, MD;  Location: [facility];  Service: General;  Laterality: Bilateral;  BILATERAL PECTORAL BLOCK   PORT-A-CATH REMOVAL Right 05/31/2019   Procedure: REMOVAL PORT-A-CATH;  Surgeon: Glenna Fellows, MD;  Location: [facility];  Service: Plastics;  Laterality: Right;   PORTACATH PLACEMENT  Right 10/20/2018   Procedure: INSERTION PORT-A-CATH WITH ULTRASOUND;  Surgeon: Emelia Loron, MD;  Location: [facility];  Service: General;  Laterality: Right;   REMOVAL OF BILATERAL TISSUE EXPANDERS WITH PLACEMENT OF BILATERAL BREAST IMPLANTS Bilateral 05/31/2019   Procedure: REMOVAL OF BILATERAL TISSUE EXPANDERS WITH PLACEMENT OF SILICONE BILATERAL BREAST IMPLANTS;  Surgeon: Glenna Fellows, MD;  Location: [facility];  Service: Plastics;  Laterality: Bilateral;   ROBOTIC ASSISTED LAPAROSCOPIC HYSTERECTOMY AND SALPINGECTOMY N/A 01/04/2020   Procedure: XI ROBOTIC ASSISTED LAPAROSCOPIC HYSTERECTOMY AND BILATERAL SALPINGECTOMY;  Surgeon: Carver Fila, MD;  Location: [facility];  Service: Gynecology;  Laterality: N/A;   UPPER GASTROINTESTINAL ENDOSCOPY     Current Outpatient Medications  Medication Sig Dispense Refill   aspirin EC 81 MG tablet Take 81 mg by mouth daily. Swallow whole.     buPROPion (WELLBUTRIN XL) 150 MG 24 hr tablet TAKE 1 TABLET(150 MG) BY MOUTH DAILY 90 tablet 3   No current facility-administered medications for this visit.    Current Outpatient Medications:    aspirin EC 81 MG tablet, Take 81 mg by mouth daily. Swallow whole., Disp: , Rfl:    buPROPion (WELLBUTRIN XL) 150 MG 24 hr tablet, TAKE 1 TABLET(150 MG) BY MOUTH DAILY, Disp: 90 tablet, Rfl: 3 Allergies  Allergen Reactions   Methocarbamol Itching   Family History  Problem Relation Age of Onset   Colon cancer Mother        Lynch syndrome   Rectal cancer Mother    Ulcerative colitis Mother    Diabetes Mother    Colon  polyps Father    Diabetes Father    Rheum arthritis Maternal Grandmother    Heart disease Maternal Grandfather    Hashimoto's thyroiditis Son    Colon cancer Cousin        @nd  cousin   Ulcerative colitis Niece    Breast cancer Other    Breast cancer Other    Breast cancer Other        paternal great grandmother   Esophageal cancer  Neg Hx    Stomach cancer Neg Hx    Social History   Socioeconomic History   Marital status: Married    Spouse name: Not on file   Number of children: 2   Years of education: Not on file   Highest education level: Not on file  Occupational History   Occupation: [employer]   Occupation: treatment coordinator  Tobacco Use   Smoking status: Never   Smokeless tobacco: Never  Vaping Use   Vaping status: Never Used  Substance and Sexual Activity   Alcohol use: No   Drug use: No   Sexual activity: Yes    Birth control/protection: Surgical    Comment: husband has had vasectomy  Other Topics Concern   Not on file  Social History Narrative   Not on file   Social Drivers of Health   Financial Resource Strain: Not on file  Food Insecurity: Not on file  Transportation Needs: No Transportation Needs (10/21/2018)   PRAPARE - Administrator, Civil Service (Medical): No    Lack of Transportation (Non-Medical): No  Physical Activity: Not on file  Stress: Not on file  Social Connections: Not on file  Intimate Partner Violence: Not on file    Physical Exam: There were no vitals filed for this visit. There is no height or weight on file to calculate BMI. GEN: NAD EYE: Sclerae anicteric ENT: MMM CV: Non-tachycardic GI: Soft, NT/ND NEURO:  Alert & Oriented x 3  Lab Results: No results for input(s): "WBC", "HGB", "HCT", "PLT" in the last 72 hours. BMET No results for input(s): "NA", "K", "CL", "CO2", "GLUCOSE", "BUN", "CREATININE", "CALCIUM" in the last 72 hours. LFT No results for input(s): "PROT", "ALBUMIN", "AST", "ALT", "ALKPHOS", "BILITOT", "BILIDIR", "IBILI" in the last 72 hours. PT/INR No results for input(s): "LABPROT", "INR" in the last 72 hours.   Impression / Plan: This is a 43 y.o.female who presents for Colonoscopy for history of Lynch Syndrome.   The risks and benefits of endoscopic evaluation/treatment were discussed with the patient and/or  family; these include but are not limited to the risk of perforation, infection, bleeding, missed lesions, lack of diagnosis, severe illness requiring hospitalization, as well as anesthesia and sedation related illnesses.  The patient's history has been reviewed, patient examined, no change in status, and deemed stable for procedure.  The patient and/or family is agreeable to proceed.    Corliss Parish, MD [facility]oscopy Office # [phone]

## 2023-07-31 NOTE — Progress Notes (Signed)
 Sedate, gd SR, tolerated procedure well, VSS, report to RN

## 2023-08-03 ENCOUNTER — Telehealth: Payer: Self-pay

## 2023-08-03 NOTE — Telephone Encounter (Signed)
 Follow up call to pt, lm for pt to call if having any difficulty with normal activities or eating and drinking.  Also to call if any other questions or concerns.

## 2023-08-04 ENCOUNTER — Encounter: Payer: Self-pay | Admitting: Gastroenterology

## 2023-08-04 LAB — SURGICAL PATHOLOGY

## 2023-10-12 ENCOUNTER — Telehealth: Payer: Self-pay

## 2023-10-12 NOTE — Telephone Encounter (Addendum)
 [facility] [id number] Understanding and Predicting Breast Cancer Events after Treatment (UPBEAT)  Study Follow-up - Year 5   Ms. [name] was contacted by phone regarding the Year 5 follow-up for the above study. Patient reported doing well and denied any hospitalizations since the last follow-up call on 10/08/2022. She also denied experiencing any cardiac events, including myocardial infarction (MI), percutaneous coronary intervention (PCI), coronary artery bypass grafting (CABG), heart catheterization, cerebrovascular accident (CVA), or a diagnosis of heart failure since the last contact.   Patient was not aware of the study email regarding the annual follow-up questionnaire [facility] Cardiomyopathy Questionnaire, KCCQ-12). She stated that she will complete it online if she receives it. Pt agreed to a follow-up call on the questionnaire. She confirmed that the email address listed in study ([employer]) is correct. Pt was informed that the next follow-up call will be in approximately one year.    Patient stated she has no questions at this time and she knows to contact the research team with any study-related questions in the future. Patient was thanked for her time and her continued participation to the study.   Shanisha Lech, Ph.D. Clinical Research Coordinator [phone] 10/12/2023 4:19 PM

## 2023-10-20 NOTE — Telephone Encounter (Signed)
 [facility] [id number] Understanding and Predicting Breast Cancer Events after Treatment (UPBEAT)  Study Follow-up - Year 5  Ms. Mikolajczak was called and a voice message was left to remind her to complete the [facility] Cardiomyopathy Questionnaire (KCCQ-12). Pt completed the form online on 10/20/2023 at 10:15 AM. The Year 5 study follow-up is now complete. The Year 6 study follow-up will be scheduled in approximately one year.  Murdis Flitton, Ph.D. Clinical Research Coordinator [phone] 10/20/2023 11:18 AM

## 2023-12-31 ENCOUNTER — Encounter: Payer: Self-pay | Admitting: Hematology and Oncology

## 2024-02-11 ENCOUNTER — Encounter: Payer: Self-pay | Admitting: Hematology and Oncology

## 2024-06-02 ENCOUNTER — Inpatient Hospital Stay: Payer: BC Managed Care – PPO | Admitting: Hematology and Oncology

## 2024-06-09 ENCOUNTER — Inpatient Hospital Stay: Admitting: Hematology and Oncology

## 2024-06-09 VITALS — BP 130/88 | HR 72 | Temp 98.5°F | Resp 18 | Ht 63.0 in | Wt 154.0 lb

## 2024-06-09 DIAGNOSIS — C50411 Malignant neoplasm of upper-outer quadrant of right female breast: Secondary | ICD-10-CM

## 2024-06-09 DIAGNOSIS — Z171 Estrogen receptor negative status [ER-]: Secondary | ICD-10-CM

## 2024-06-09 NOTE — Assessment & Plan Note (Signed)
 10/11/2018:Patient palpated a right breast mass, mammogram revealed 1.6 cm mass, biopsy revealed grade 3 invasive ductal carcinoma ER 0%, PR 0%, Ki-67 20%, HER-2 3+ positive, T1CN0 stage IA   Treatment plan: 1. Neoadjuvant Taxol  and Herceptin  weekly x12.  Followed by Herceptin  maintenance therapy every 3 weeks to complete 1 year.  10/29/2018-01/26/2019, Herceptin  maintenance subcutaneously until 10/20/2019 2.  02/22/2019:Bilateral mastectomies [name]) Left breast: benign tissue and no evidence of malignancy Right breast: no residual carcinoma, 2 right axillary lymph nodes negative    Upbeat clinical trial: No adverse effects from participating in the trial Lynch syndrome positive on genetic testing: Instructed her to take aspirin daily.  She is scheduling a colonoscopy with GI ---------------------------------------------------------------------------------------------------------------------------------------- Depression/anxiety/panic attacks: On Wellbutrin  Chemo-induced peripheral neuropathy: Mild symptoms on her shins.  No further problems She and her husband got back together and she is very happy.    01/04/2020: Dr. Eloy hysterectomy.with BSO   Breast Cancer surveillance: 1. Mammograms: not indicated since she had bil mastectomies 2. Breast Exam: 06/09/2024 benign 3.  Guardant reveal 12/22/2023: Negative Return to clinic in 1 year for follow-up.

## 2024-06-09 NOTE — Progress Notes (Signed)
 "  Patient Care Team: Redmon, Noelle, [place] as PCP - General (Nurse Practitioner) Viktoria Comer SAUNDERS, MD as Consulting Physician (Gynecologic Oncology)  DIAGNOSIS:  Encounter Diagnosis  Name Primary?   Malignant neoplasm of upper-outer quadrant of right breast in female, estrogen receptor negative (HCC) Yes    SUMMARY OF ONCOLOGIC HISTORY: Oncology History  Malignant neoplasm of upper-outer quadrant of right breast in female, estrogen receptor negative (HCC)  10/11/2018 Initial Diagnosis   Patient palpated a right breast mass, mammogram revealed 1.6 cm mass, biopsy revealed grade 3 invasive ductal carcinoma ER 0%, PR 0%, Ki-67 20%, HER-2 3+ positive, T1CN0 stage IA   10/19/2018 Cancer Staging   Staging form: Breast, AJCC 8th Edition - Clinical stage from 10/19/2018: Stage IA (cT1c, cN0, cM0, G3, ER-, PR-, HER2+) - Signed by Odean Potts, MD on 10/19/2018   10/29/2018 - 01/20/2019 Chemotherapy   The patient had trastuzumab  (HERCEPTIN ) 300 mg in sodium chloride  0.9 % 250 mL chemo infusion, 294 mg, Intravenous,  Once, 2 of 2 cycles Administration: 300 mg (10/29/2018), 150 mg (11/03/2018), 150 mg (12/02/2018), 150 mg (11/12/2018), 150 mg (11/25/2018), 150 mg (12/09/2018) PACLitaxel  (TAXOL ) 150 mg in sodium chloride  0.9 % 250 mL chemo infusion (</= 80mg /m2), 80 mg/m2 = 150 mg, Intravenous,  Once, 3 of 3 cycles Dose modification: 65 mg/m2 (original dose 80 mg/m2, Cycle 3, Reason: Dose not tolerated) Administration: 150 mg (10/29/2018), 150 mg (11/03/2018), 150 mg (12/02/2018), 150 mg (11/12/2018), 150 mg (11/25/2018), 150 mg (12/09/2018), 150 mg (12/16/2018), 150 mg (12/23/2018), 120 mg (12/30/2018), 120 mg (01/06/2019), 120 mg (01/13/2019), 120 mg (01/20/2019) trastuzumab -dkst (OGIVRI ) 150 mg in sodium chloride  0.9 % 250 mL chemo infusion, 150 mg (100 % of original dose 150 mg), Intravenous,  Once, 2 of 2 cycles Dose modification: 150 mg (original dose 150 mg, Cycle 2, Reason: Other (see comments), Comment: Biosimilar  Conversion), 450 mg (original dose 150 mg, Cycle 3, Reason: Other (see comments), Comment: Biosimilar Conversion) Administration: 150 mg (12/16/2018), 150 mg (12/23/2018), 150 mg (12/30/2018), 150 mg (01/06/2019), 150 mg (01/13/2019), 450 mg (01/20/2019)  for chemotherapy treatment.    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  The Common Hereditary Gene Panel offered by [facility] includes sequencing and/or deletion duplication testing of the following 48 genes: APC, ATM, AXIN2, BARD1, BMPR1A, BRCA1, BRCA2, BRIP1, CDH1, CDK4, CDKN2A (p14ARF), CDKN2A (p16INK4a), CHEK2, CTNNA1, DICER1, EPCAM (Deletion/duplication testing only), GREM1 (promoter region deletion/duplication testing only), KIT, MEN1, MLH1, MSH2, MSH3, MSH6, MUTYH, NBN, NF1, NHTL1, PALB2, PDGFRA, PMS2, POLD1, POLE, PTEN, RAD50, RAD51C, RAD51D, RNF43, SDHB, SDHC, SDHD, SMAD4, SMARCA4. STK11, TP53, TSC1, TSC2, and VHL.  The following genes were evaluated for sequence changes only: SDHA and HOXB13 c.251G>A variant only.   Two VUS's also found - one in ATM c.133C>T and the other in POLE c.1280C>T.  These will not affect medical management. The report date is November 12, 2018.  UPDATE: ATM c.133C>T  VUS has been reclassified as Benign.  The amended report is November 10, 2022.   02/10/2019 - 10/20/2019 Chemotherapy   Patient is on Treatment Plan : BREAST Trastuzumab  q21d     02/22/2019 Surgery   Bilateral mastectomies [name]) Left breast: benign tissue and no evidence of malignancy Right breast: no residual carcinoma, 2 right axillary lymph nodes negative      CHIEF COMPLIANT: Surveillance of breast cancer  HISTORY OF PRESENT ILLNESS:  History of Present Illness [name] is a 44 year old  female with stage IA, grade 3, ER-negative, HER2-positive invasive ductal carcinoma of the right breast in remission who presents for routine oncology  surveillance.  She feels well with no pain or new symptoms.  She reports intentional weight loss and improved muscle tone with regular exercise and continues to feel well.  She recently had a negative Signatera minimal residual disease test and is scheduled for the next test.  She notes increased attention to her own health in the context of her husband's recent myocarditis and upcoming thyroid surgery.       ALLERGIES:  is allergic to methocarbamol .  MEDICATIONS:  Current Outpatient Medications  Medication Sig Dispense Refill   aspirin EC 81 MG tablet Take 81 mg by mouth daily. Swallow whole.     buPROPion  (WELLBUTRIN  XL) 150 MG 24 hr tablet TAKE 1 TABLET(150 MG) BY MOUTH DAILY (Patient not taking: Reported on 06/09/2024) 90 tablet 3   No current facility-administered medications for this visit.    PHYSICAL EXAMINATION: ECOG PERFORMANCE STATUS: 1 - Symptomatic but completely ambulatory  Vitals:   06/09/24 1515  BP: 130/88  Pulse: 72  Resp: 18  Temp: 98.5 F (36.9 C)  SpO2: 100%   Filed Weights   06/09/24 1515  Weight: 154 lb (69.9 kg)    Physical Exam Breast exam: Bilateral breast reconstruction no palpable lumps or nodules  (exam performed in the presence of a chaperone)  LABORATORY DATA:  I have reviewed the data as listed    Latest Ref Rng & Units 02/20/2021    4:14 PM 12/30/2019    2:40 PM 07/07/2019    9:06 AM  CMP  Glucose 70 - 99 mg/dL 91  83  97   BUN 6 - 20 mg/dL 14  14  18    Creatinine 0.44 - 1.00 mg/dL 9.16  9.18  9.11   Sodium 135 - 145 mmol/L 140  139  143   Potassium 3.5 - 5.1 mmol/L 4.2  3.9  4.6   Chloride 98 - 111 mmol/L 102  101  108   CO2 22 - 32 mmol/L 25  29  26    Calcium 8.9 - 10.3 mg/dL 9.7  9.1  9.2   Total Protein 6.5 - 8.1 g/dL 7.6  7.4  7.0   Total Bilirubin 0.3 - 1.2 mg/dL 0.3  0.5  0.4   Alkaline Phos 38 - 126 U/L 63  62  70   AST 15 - 41 U/L 14  15  11    ALT 0 - 44 U/L 12  13  10      Lab Results  Component Value Date    WBC 7.6 02/20/2021   HGB 13.9 02/20/2021   HCT 40.5 02/20/2021   MCV 93.3 02/20/2021   PLT 248 02/20/2021   NEUTROABS 5.1 02/20/2021    ASSESSMENT & PLAN:  Malignant neoplasm of upper-outer quadrant of right breast in female, estrogen receptor negative (HCC) 10/11/2018:Patient palpated a right breast mass, mammogram revealed 1.6 cm mass, biopsy revealed grade 3 invasive ductal carcinoma ER 0%, PR 0%, Ki-67 20%, HER-2 3+ positive, T1CN0 stage IA   Treatment plan: 1. Neoadjuvant Taxol  and Herceptin  weekly x12.  Followed by Herceptin  maintenance therapy every 3 weeks to complete 1 year.  10/29/2018-01/26/2019, Herceptin  maintenance subcutaneously until 10/20/2019 2.  02/22/2019:Bilateral mastectomies [name]) Left breast: benign tissue and no evidence of malignancy Right breast: no residual carcinoma, 2 right axillary lymph nodes negative    Upbeat clinical trial: No adverse effects from participating  in the trial Lynch syndrome positive on genetic testing: Instructed her to take aspirin daily.  She is scheduling a colonoscopy with GI ---------------------------------------------------------------------------------------------------------------------------------------- Depression/anxiety/panic attacks: On Wellbutrin  Chemo-induced peripheral neuropathy: Mild symptoms on her shins.  No further problems She and her husband got back together and she is very happy.    01/04/2020: Dr. Eloy hysterectomy.with BSO   Breast Cancer surveillance: 1. Mammograms: not indicated since she had bil mastectomies 2. Breast Exam: 06/09/2024 benign 3.  Guardant reveal 12/22/2023: Negative Return to clinic in 1 year for follow-up and after that she could be seen on an as-needed basis.   No orders of the defined types were placed in this encounter.  The patient has a good understanding of the overall plan. she agrees with it. she will call with any problems that may develop before the next visit  here.  I personally spent a total of 30 minutes in the care of the patient today including preparing to see the patient, getting/reviewing separately obtained history, performing a medically appropriate exam/evaluation, counseling and educating, placing orders, referring and communicating with other health care professionals, documenting clinical information in the EHR, independently interpreting results, communicating results, and coordinating care.   Dr.Jeraldine Primeau 06/09/24    "

## 2024-07-08 ENCOUNTER — Encounter

## 2024-07-22 ENCOUNTER — Encounter: Admitting: Gastroenterology

## 2025-06-12 ENCOUNTER — Inpatient Hospital Stay: Admitting: Hematology and Oncology
